# Patient Record
Sex: Male | Born: 1955 | Race: White | Hispanic: No | Marital: Married | Smoking: Never smoker
Health system: Southern US, Community
[De-identification: ages and names within clinical notes are randomized; demographics above are authoritative.]

## PROBLEM LIST (undated history)

## (undated) DIAGNOSIS — F32A Depression, unspecified: Secondary | ICD-10-CM

## (undated) DIAGNOSIS — Z862 Personal history of diseases of the blood and blood-forming organs and certain disorders involving the immune mechanism: Secondary | ICD-10-CM

## (undated) DIAGNOSIS — R399 Unspecified symptoms and signs involving the genitourinary system: Secondary | ICD-10-CM

## (undated) DIAGNOSIS — F419 Anxiety disorder, unspecified: Secondary | ICD-10-CM

## (undated) DIAGNOSIS — E785 Hyperlipidemia, unspecified: Secondary | ICD-10-CM

## (undated) DIAGNOSIS — E039 Hypothyroidism, unspecified: Secondary | ICD-10-CM

## (undated) DIAGNOSIS — I10 Essential (primary) hypertension: Secondary | ICD-10-CM

## (undated) DIAGNOSIS — J449 Chronic obstructive pulmonary disease, unspecified: Secondary | ICD-10-CM

## (undated) DIAGNOSIS — N138 Other obstructive and reflux uropathy: Secondary | ICD-10-CM

## (undated) DIAGNOSIS — Z9889 Other specified postprocedural states: Secondary | ICD-10-CM

## (undated) DIAGNOSIS — N401 Enlarged prostate with lower urinary tract symptoms: Secondary | ICD-10-CM

## (undated) DIAGNOSIS — F329 Major depressive disorder, single episode, unspecified: Secondary | ICD-10-CM

## (undated) DIAGNOSIS — R339 Retention of urine, unspecified: Secondary | ICD-10-CM

## (undated) DIAGNOSIS — H209 Unspecified iridocyclitis: Secondary | ICD-10-CM

## (undated) DIAGNOSIS — Z973 Presence of spectacles and contact lenses: Secondary | ICD-10-CM

## (undated) DIAGNOSIS — H538 Other visual disturbances: Secondary | ICD-10-CM

## (undated) DIAGNOSIS — T7840XA Allergy, unspecified, initial encounter: Secondary | ICD-10-CM

## (undated) DIAGNOSIS — J4540 Moderate persistent asthma, uncomplicated: Secondary | ICD-10-CM

## (undated) DIAGNOSIS — K219 Gastro-esophageal reflux disease without esophagitis: Secondary | ICD-10-CM

## (undated) DIAGNOSIS — E7800 Pure hypercholesterolemia, unspecified: Secondary | ICD-10-CM

## (undated) DIAGNOSIS — H4061X0 Glaucoma secondary to drugs, right eye, stage unspecified: Secondary | ICD-10-CM

## (undated) DIAGNOSIS — H269 Unspecified cataract: Secondary | ICD-10-CM

## (undated) DIAGNOSIS — J45909 Unspecified asthma, uncomplicated: Secondary | ICD-10-CM

## (undated) HISTORY — PX: EYE SURGERY: SHX253

## (undated) HISTORY — DX: Other obstructive and reflux uropathy: N40.1

## (undated) HISTORY — PX: CATARACT EXTRACTION W/ INTRAOCULAR LENS  IMPLANT, BILATERAL: SHX1307

## (undated) HISTORY — PX: COLONOSCOPY: SHX174

## (undated) HISTORY — DX: Allergy, unspecified, initial encounter: T78.40XA

## (undated) HISTORY — DX: Chronic obstructive pulmonary disease, unspecified: J44.9

## (undated) HISTORY — DX: Gastro-esophageal reflux disease without esophagitis: K21.9

## (undated) HISTORY — DX: Unspecified asthma, uncomplicated: J45.909

## (undated) HISTORY — DX: Unspecified cataract: H26.9

---

## 1987-08-16 HISTORY — PX: INGUINAL HERNIA REPAIR: SUR1180

## 2000-01-25 ENCOUNTER — Encounter: Admission: RE | Admit: 2000-01-25 | Discharge: 2000-01-25 | Payer: Self-pay | Admitting: Urology

## 2000-01-25 ENCOUNTER — Encounter: Payer: Self-pay | Admitting: Urology

## 2000-08-15 HISTORY — PX: RETINAL DETACHMENT SURGERY: SHX105

## 2001-04-10 ENCOUNTER — Encounter: Payer: Self-pay | Admitting: Ophthalmology

## 2001-04-10 ENCOUNTER — Ambulatory Visit (HOSPITAL_COMMUNITY): Admission: RE | Admit: 2001-04-10 | Discharge: 2001-04-11 | Payer: Self-pay | Admitting: Ophthalmology

## 2001-04-10 HISTORY — PX: RETINAL DETACHMENT SURGERY: SHX105

## 2001-08-15 HISTORY — PX: RETINAL DETACHMENT SURGERY: SHX105

## 2001-10-02 ENCOUNTER — Ambulatory Visit (HOSPITAL_COMMUNITY): Admission: RE | Admit: 2001-10-02 | Discharge: 2001-10-03 | Payer: Self-pay | Admitting: Ophthalmology

## 2005-04-05 ENCOUNTER — Ambulatory Visit: Payer: Self-pay | Admitting: Family Medicine

## 2005-10-19 ENCOUNTER — Ambulatory Visit: Payer: Self-pay | Admitting: Family Medicine

## 2005-10-26 ENCOUNTER — Encounter (INDEPENDENT_AMBULATORY_CARE_PROVIDER_SITE_OTHER): Payer: Self-pay | Admitting: Family Medicine

## 2005-10-26 LAB — CONVERTED CEMR LAB: Blood Glucose, Fasting: 95 mg/dL

## 2006-01-10 ENCOUNTER — Ambulatory Visit: Payer: Self-pay | Admitting: Internal Medicine

## 2006-01-24 ENCOUNTER — Encounter (INDEPENDENT_AMBULATORY_CARE_PROVIDER_SITE_OTHER): Payer: Self-pay | Admitting: Family Medicine

## 2006-01-24 ENCOUNTER — Ambulatory Visit: Payer: Self-pay | Admitting: Internal Medicine

## 2006-01-24 ENCOUNTER — Ambulatory Visit (HOSPITAL_COMMUNITY): Admission: RE | Admit: 2006-01-24 | Discharge: 2006-01-24 | Payer: Self-pay | Admitting: Internal Medicine

## 2006-01-24 ENCOUNTER — Encounter (INDEPENDENT_AMBULATORY_CARE_PROVIDER_SITE_OTHER): Payer: Self-pay | Admitting: *Deleted

## 2006-01-30 ENCOUNTER — Ambulatory Visit: Payer: Self-pay | Admitting: Family Medicine

## 2006-04-03 ENCOUNTER — Ambulatory Visit: Payer: Self-pay | Admitting: Internal Medicine

## 2006-04-06 ENCOUNTER — Ambulatory Visit (HOSPITAL_COMMUNITY): Admission: RE | Admit: 2006-04-06 | Discharge: 2006-04-06 | Payer: Self-pay | Admitting: Family Medicine

## 2006-04-06 ENCOUNTER — Ambulatory Visit: Payer: Self-pay | Admitting: Family Medicine

## 2006-04-08 ENCOUNTER — Encounter (INDEPENDENT_AMBULATORY_CARE_PROVIDER_SITE_OTHER): Payer: Self-pay | Admitting: Family Medicine

## 2006-04-08 LAB — CONVERTED CEMR LAB
RBC count: 15.2 10*6/uL
WBC, blood: 4.97 10*3/uL

## 2006-04-21 ENCOUNTER — Ambulatory Visit: Payer: Self-pay | Admitting: Family Medicine

## 2006-05-03 ENCOUNTER — Encounter (INDEPENDENT_AMBULATORY_CARE_PROVIDER_SITE_OTHER): Payer: Self-pay | Admitting: Family Medicine

## 2006-06-02 ENCOUNTER — Ambulatory Visit: Payer: Self-pay | Admitting: Family Medicine

## 2006-07-04 ENCOUNTER — Encounter: Payer: Self-pay | Admitting: Family Medicine

## 2006-07-04 DIAGNOSIS — J309 Allergic rhinitis, unspecified: Secondary | ICD-10-CM | POA: Insufficient documentation

## 2006-07-04 DIAGNOSIS — K209 Esophagitis, unspecified without bleeding: Secondary | ICD-10-CM | POA: Insufficient documentation

## 2006-07-04 DIAGNOSIS — R1310 Dysphagia, unspecified: Secondary | ICD-10-CM | POA: Insufficient documentation

## 2006-07-04 DIAGNOSIS — N400 Enlarged prostate without lower urinary tract symptoms: Secondary | ICD-10-CM | POA: Insufficient documentation

## 2006-07-04 DIAGNOSIS — K219 Gastro-esophageal reflux disease without esophagitis: Secondary | ICD-10-CM | POA: Insufficient documentation

## 2006-07-04 DIAGNOSIS — K589 Irritable bowel syndrome without diarrhea: Secondary | ICD-10-CM | POA: Insufficient documentation

## 2006-07-04 DIAGNOSIS — E785 Hyperlipidemia, unspecified: Secondary | ICD-10-CM | POA: Insufficient documentation

## 2006-07-04 DIAGNOSIS — S62609A Fracture of unspecified phalanx of unspecified finger, initial encounter for closed fracture: Secondary | ICD-10-CM | POA: Insufficient documentation

## 2006-07-04 DIAGNOSIS — F329 Major depressive disorder, single episode, unspecified: Secondary | ICD-10-CM | POA: Insufficient documentation

## 2006-07-04 DIAGNOSIS — I10 Essential (primary) hypertension: Secondary | ICD-10-CM | POA: Insufficient documentation

## 2006-07-28 ENCOUNTER — Ambulatory Visit: Payer: Self-pay | Admitting: Family Medicine

## 2006-10-16 ENCOUNTER — Encounter (INDEPENDENT_AMBULATORY_CARE_PROVIDER_SITE_OTHER): Payer: Self-pay | Admitting: Family Medicine

## 2006-10-17 ENCOUNTER — Encounter (INDEPENDENT_AMBULATORY_CARE_PROVIDER_SITE_OTHER): Payer: Self-pay | Admitting: Family Medicine

## 2006-10-28 ENCOUNTER — Encounter (INDEPENDENT_AMBULATORY_CARE_PROVIDER_SITE_OTHER): Payer: Self-pay | Admitting: Family Medicine

## 2006-10-28 LAB — CONVERTED CEMR LAB: PSA: NORMAL ng/mL

## 2006-11-06 ENCOUNTER — Ambulatory Visit: Payer: Self-pay | Admitting: Family Medicine

## 2006-11-06 DIAGNOSIS — F528 Other sexual dysfunction not due to a substance or known physiological condition: Secondary | ICD-10-CM | POA: Insufficient documentation

## 2006-11-06 LAB — CONVERTED CEMR LAB
Cholesterol, target level: 200 mg/dL
HDL goal, serum: 40 mg/dL
LDL Goal: 130 mg/dL

## 2006-11-20 ENCOUNTER — Encounter (INDEPENDENT_AMBULATORY_CARE_PROVIDER_SITE_OTHER): Payer: Self-pay | Admitting: Family Medicine

## 2006-11-21 LAB — CONVERTED CEMR LAB
AST: 17 units/L (ref 0–37)
Albumin: 4.7 g/dL (ref 3.5–5.2)
BUN: 15 mg/dL (ref 6–23)
CO2: 25 meq/L (ref 19–32)
Calcium: 10 mg/dL (ref 8.4–10.5)
Chloride: 97 meq/L (ref 96–112)
Cholesterol: 153 mg/dL (ref 0–200)
Creatinine, Ser: 1.26 mg/dL (ref 0.40–1.50)
Glucose, Bld: 93 mg/dL (ref 70–99)
HDL: 31 mg/dL — ABNORMAL LOW (ref >39)
Hemoglobin: 15.6 g/dL (ref 13.0–17.0)
Lymphocytes Relative: 22 % (ref 12–46)
Lymphs Abs: 1.8 10*3/uL (ref 0.7–3.3)
MCHC: 32.6 g/dL (ref 30.0–36.0)
Monocytes Absolute: 0.9 10*3/uL — ABNORMAL HIGH (ref 0.2–0.7)
Monocytes Relative: 11 % (ref 3–11)
Neutro Abs: 5.1 10*3/uL (ref 1.7–7.7)
Neutrophils Relative %: 62 % (ref 43–77)
Potassium: 4.8 meq/L (ref 3.5–5.3)
RBC: 5.29 M/uL (ref 4.22–5.81)
Total CHOL/HDL Ratio: 4.9
WBC: 8.1 10*3/uL (ref 4.0–10.5)

## 2006-12-18 ENCOUNTER — Encounter (INDEPENDENT_AMBULATORY_CARE_PROVIDER_SITE_OTHER): Payer: Self-pay | Admitting: Family Medicine

## 2007-01-18 ENCOUNTER — Telehealth (INDEPENDENT_AMBULATORY_CARE_PROVIDER_SITE_OTHER): Payer: Self-pay | Admitting: Family Medicine

## 2007-02-05 ENCOUNTER — Ambulatory Visit: Payer: Self-pay | Admitting: Family Medicine

## 2007-06-04 ENCOUNTER — Ambulatory Visit: Payer: Self-pay | Admitting: Family Medicine

## 2007-06-05 ENCOUNTER — Telehealth (INDEPENDENT_AMBULATORY_CARE_PROVIDER_SITE_OTHER): Payer: Self-pay | Admitting: *Deleted

## 2007-06-05 LAB — CONVERTED CEMR LAB
Albumin: 4.7 g/dL (ref 3.5–5.2)
Alkaline Phosphatase: 29 units/L — ABNORMAL LOW (ref 39–117)
BUN: 12 mg/dL (ref 6–23)
CO2: 22 meq/L (ref 19–32)
Glucose, Bld: 70 mg/dL (ref 70–99)
Sodium: 139 meq/L (ref 135–145)
Total Bilirubin: 0.7 mg/dL (ref 0.3–1.2)
Total Protein: 7.2 g/dL (ref 6.0–8.3)

## 2007-06-14 ENCOUNTER — Ambulatory Visit: Payer: Self-pay | Admitting: Internal Medicine

## 2007-07-03 ENCOUNTER — Ambulatory Visit: Payer: Self-pay | Admitting: Internal Medicine

## 2007-07-03 ENCOUNTER — Encounter (INDEPENDENT_AMBULATORY_CARE_PROVIDER_SITE_OTHER): Payer: Self-pay | Admitting: Family Medicine

## 2007-07-03 ENCOUNTER — Encounter: Payer: Self-pay | Admitting: Internal Medicine

## 2007-09-28 ENCOUNTER — Ambulatory Visit: Payer: Self-pay | Admitting: Family Medicine

## 2007-12-03 ENCOUNTER — Encounter (INDEPENDENT_AMBULATORY_CARE_PROVIDER_SITE_OTHER): Payer: Self-pay | Admitting: Family Medicine

## 2007-12-04 ENCOUNTER — Telehealth (INDEPENDENT_AMBULATORY_CARE_PROVIDER_SITE_OTHER): Payer: Self-pay | Admitting: *Deleted

## 2007-12-04 LAB — CONVERTED CEMR LAB
Albumin: 4.2 g/dL (ref 3.5–5.2)
BUN: 11 mg/dL (ref 6–23)
Calcium: 9.4 mg/dL (ref 8.4–10.5)
Chloride: 105 meq/L (ref 96–112)
Cholesterol: 116 mg/dL (ref 0–200)
Creatinine, Ser: 1.12 mg/dL (ref 0.40–1.50)
Glucose, Bld: 84 mg/dL (ref 70–99)
HDL: 30 mg/dL — ABNORMAL LOW (ref >39)
Hemoglobin: 14.2 g/dL (ref 13.0–17.0)
Lymphs Abs: 2.1 10*3/uL (ref 0.7–4.0)
MCV: 92 fL (ref 78.0–100.0)
Monocytes Absolute: 0.7 10*3/uL (ref 0.1–1.0)
Monocytes Relative: 10 % (ref 3–12)
Neutro Abs: 3.7 10*3/uL (ref 1.7–7.7)
Neutrophils Relative %: 54 % (ref 43–77)
Potassium: 4 meq/L (ref 3.5–5.3)
RBC: 4.77 M/uL (ref 4.22–5.81)
TSH: 6.961 microintl units/mL — ABNORMAL HIGH (ref 0.350–5.50)
Total CHOL/HDL Ratio: 3.9
Triglycerides: 111 mg/dL (ref <150)
WBC: 6.9 10*3/uL (ref 4.0–10.5)

## 2007-12-07 ENCOUNTER — Ambulatory Visit: Payer: Self-pay | Admitting: Family Medicine

## 2008-01-21 ENCOUNTER — Ambulatory Visit: Payer: Self-pay | Admitting: Family Medicine

## 2008-01-21 LAB — CONVERTED CEMR LAB
Blood in Urine, dipstick: NEGATIVE
Ketones, urine, test strip: NEGATIVE
Nitrite: NEGATIVE
Protein, U semiquant: NEGATIVE
Urobilinogen, UA: 0.2

## 2008-01-23 LAB — CONVERTED CEMR LAB
Free T4: 0.88 ng/dL — ABNORMAL LOW (ref 0.89–1.80)
T3, Free: 3.1 pg/mL (ref 2.3–4.2)

## 2008-04-03 ENCOUNTER — Encounter (INDEPENDENT_AMBULATORY_CARE_PROVIDER_SITE_OTHER): Payer: Self-pay | Admitting: Family Medicine

## 2008-04-24 ENCOUNTER — Ambulatory Visit: Payer: Self-pay | Admitting: Family Medicine

## 2008-04-24 LAB — CONVERTED CEMR LAB
Bilirubin Urine: NEGATIVE
Ketones, urine, test strip: NEGATIVE
Specific Gravity, Urine: 1.01
WBC Urine, dipstick: NEGATIVE

## 2008-04-25 LAB — CONVERTED CEMR LAB
Alkaline Phosphatase: 30 units/L — ABNORMAL LOW (ref 39–117)
BUN: 10 mg/dL (ref 6–23)
Glucose, Bld: 97 mg/dL (ref 70–99)
Sodium: 140 meq/L (ref 135–145)
Total Bilirubin: 0.7 mg/dL (ref 0.3–1.2)
Total Protein: 7 g/dL (ref 6.0–8.3)

## 2008-06-04 ENCOUNTER — Ambulatory Visit: Payer: Self-pay | Admitting: Family Medicine

## 2008-10-21 ENCOUNTER — Ambulatory Visit: Payer: Self-pay | Admitting: Family Medicine

## 2008-10-21 DIAGNOSIS — F524 Premature ejaculation: Secondary | ICD-10-CM | POA: Insufficient documentation

## 2008-11-14 ENCOUNTER — Encounter (INDEPENDENT_AMBULATORY_CARE_PROVIDER_SITE_OTHER): Payer: Self-pay | Admitting: Family Medicine

## 2008-11-17 ENCOUNTER — Encounter (INDEPENDENT_AMBULATORY_CARE_PROVIDER_SITE_OTHER): Payer: Self-pay | Admitting: Family Medicine

## 2008-11-17 LAB — CONVERTED CEMR LAB
Basophils Relative: 1 % (ref 0–1)
CO2: 27 meq/L (ref 19–32)
Cholesterol: 123 mg/dL (ref 0–200)
Creatinine, Ser: 1.16 mg/dL (ref 0.40–1.50)
Eosinophils Absolute: 0.3 10*3/uL (ref 0.0–0.7)
Glucose, Bld: 86 mg/dL (ref 70–99)
Hemoglobin: 15.1 g/dL (ref 13.0–17.0)
MCHC: 31.8 g/dL (ref 30.0–36.0)
MCV: 93.9 fL (ref 78.0–100.0)
Monocytes Absolute: 0.6 10*3/uL (ref 0.1–1.0)
Monocytes Relative: 9 % (ref 3–12)
RBC: 5.06 M/uL (ref 4.22–5.81)
Total Bilirubin: 0.6 mg/dL (ref 0.3–1.2)
Total CHOL/HDL Ratio: 4.4
Total Protein: 7.2 g/dL (ref 6.0–8.3)
Triglycerides: 191 mg/dL — ABNORMAL HIGH (ref <150)
VLDL: 38 mg/dL (ref 0–40)

## 2008-11-20 LAB — CONVERTED CEMR LAB: T3, Free: 2.7 pg/mL (ref 2.3–4.2)

## 2009-02-05 ENCOUNTER — Ambulatory Visit: Payer: Self-pay | Admitting: Family Medicine

## 2009-02-05 DIAGNOSIS — H3320 Serous retinal detachment, unspecified eye: Secondary | ICD-10-CM | POA: Insufficient documentation

## 2009-02-06 ENCOUNTER — Encounter (INDEPENDENT_AMBULATORY_CARE_PROVIDER_SITE_OTHER): Payer: Self-pay | Admitting: Family Medicine

## 2009-05-04 ENCOUNTER — Ambulatory Visit: Payer: Self-pay | Admitting: Family Medicine

## 2009-05-14 ENCOUNTER — Encounter (INDEPENDENT_AMBULATORY_CARE_PROVIDER_SITE_OTHER): Payer: Self-pay | Admitting: Family Medicine

## 2010-12-31 NOTE — Op Note (Signed)
. [facility]  Patient:    Jeffrey, Gordon Visit Number: [id number] MRN: [id number]          Service Type: DSU Location: RCRM 2550 [id number] Attending Physician:  Bertrum Sol Dictated by:   Beulah Gandy. Ashley Royalty, M.D. Proc. Date: 10/02/01 Admit Date:  10/02/2001                             Operative Report  DATE OF BIRTH:  02-20-1956.  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  OPERATION PERFORMED:  Scleral buckle #2 left eye with indirect ophthalmoscope laser left eye.  SURGEON:  Beulah Gandy. Ashley Royalty, M.D.  ASSISTANT:  Lu Duffel, COA, SA  ANESTHESIA:  General.  DESCRIPTION OF PROCEDURE:  Usual prep and drape.  Limbal peritomy 360 degrees. Opening the previous buckle from 3 oclock to 9 oclock.  Additional posterior dissection carried out from 4 oclock to 8 oclock.  Perforation site chosen in the posterior aspect of the bed at 6 oclock.  A moderate amount of clear, colorless subretinal fluid came forth.  The previous buckle was allowed to remain in place.  Once all the fluid had egressed, a 508G radial segment was placed against the globe at 6 oclock and snugged up tightly against the previous 508 segment at 4 oclock.  The buckle was closed with 4-0 Mersilene sutures.  Indirect ophthalmoscopy showed no subretinal fluid remaining.  The indirect ophthalmoscope laser was moved into place and 503 burns were placed on the scleral buckle.  The power was between 400 and 500 mw, diameter 1000 microns each and 0.1 seconds each.  The sterile air was injected through the 2 oclock pars plana to reinflate the globe.  The suture ends were knotted and the free ends removed.  The conjunctiva was reposited with 7-0 chromic suture. Polymyxin and gentamicin were irrigated into Tenons space.  Atropine solution was applied.  Decadron 10 mg was injected into the  lower subconjunctival space.  Polysporin, a patch and shield were placed.  The patient was awakened and taken to recovery in satisfactory condition.  The closing tension was 10 with a Barraquer tonometer.  COMPLICATIONS:  None.  DURATION:  1-1/2 hours.  The patient was awakened and taken to recovery in satisfactory condition. Dictated by:   Beulah Gandy. Ashley Royalty, M.D. Attending Physician:  Bertrum Sol DD:  10/02/01 TD:  10/02/01 Job: [id number]

## 2010-12-31 NOTE — Consult Note (Signed)
NAMECLEAVE, TERNES              ACCOUNT NO.:  [id number]   MEDICAL RECORD NO.:  [id number]           PATIENT TYPE:   LOCATION:                                 FACILITY:   PHYSICIAN:  Lionel December, M.D.         DATE OF BIRTH:   DATE OF CONSULTATION:  01/12/2006  DATE OF DISCHARGE:                                   CONSULTATION   PRIMARY CARE PHYSICIAN:  Dr. Erby Pian   REASON FOR CONSULTATION:  Chronic GERD, long-standing, intermittent  dysphagia.   HISTORY OF PRESENT ILLNESS:  Mr. Sollenberger is a 55 year old Caucasian male  who presents with a 15-year history of chronic GERD.  He states his symptoms  are currently well-developed on omeprazole and has previous history of  heartburn and indigestion.  He complains over the last 15 years  intermittently he has had solid food dysphagia.  More recently his symptoms  have become more frequent.  He describes a pressure in his mid esophagus and  feels at times his food is stuck.  If he continues to swallow eventually the  food does pass.  He denies any regurgitation.  He denies any odynophagia.  He denies any nausea, vomiting.  Denies any anorexia or early satiety.  Denies any significant abdominal pain.  Does have fleeting left upper  quadrant pain generally worse with stress.  It is self-limited and generally  lasts less than an hour.  His bowel movements are once a day or every other  day.  Denies rectal bleeding, melena, diarrhea, or constipation.   PAST MEDICAL HISTORY:  1.  He had a colonoscopy in 2004 by Dr. Marina Goodell which was negative.  2.  He has history of hypertension.  3.  Hypercholesterolemia.  4.  Chronic GERD.  5.  BPH.  6.  He has had surgery on his left retina and cataract.  7.  Left inguinal hernia repair.   CURRENT MEDICATIONS:  1.  Lipitor 20 mg daily.  2.  Flomax 0.4 mg daily.  3.  Diovan 80 mg daily.  4.  Omeprazole 20 mg daily.  5.  Viagra 100 mg p.r.n.   ALLERGIES:  No known drug allergies.   FAMILY  HISTORY:  Positive for a father with recurrent colon carcinoma  diagnosed initially at age 44.  He is currently 17 and doing fair.  Mother  has a history of rheumatoid arthritis.  He has one brother with hypertension  and hypercholesterolemia.   SOCIAL HISTORY:  Mr. Schetter has been married for 21 years.  He has two  grown healthy children.  He is a [employer].  He denies any  tobacco, alcohol, or drug use.   REVIEW OF SYSTEMS:  CONSTITUTIONAL:  His weight is steadily increasing over  the last few years.  Denies any fatigue.  CARDIOVASCULAR:  Denies any chest  pain or palpitations.  PULMONARY:  Denies any shortness of breath or  dyspnea.  He does have occasional nonproductive cough.  Denies any  hemoptysis.  GI:  See HPI.   PHYSICAL EXAMINATION:  VITAL SIGNS:  Weight 212 pounds, height  76 inches,  temperature 98.4, blood pressure 126/80, pulse 80.  GENERAL:  Mr. Hellums is a 55 year old overweight Caucasian male who is  alert, oriented, pleasant, cooperative, in no acute distress.  HEENT:  Sclerae clear.  Non-icteric.  Conjunctivae pink.  Oropharynx pink  and moist without any lesions.  NECK:  Supple without any mass or thyromegaly.  CHEST:  Heart regular rate and rhythm with normal S1, S2 without clicks,  rubs, or gallops.  LUNGS:  Clear to auscultation bilaterally.  ABDOMEN:  Protuberant with positive bowel sounds x4.  No bruits auscultated.  Soft, nontender, nondistended without palpable mass, hepatosplenomegaly.  No  rebound tenderness or guarding.  EXTREMITIES:  Without clubbing or edema bilaterally.   ASSESSMENT:  Mr. Maland is a 55 year old Caucasian male with a 15-year  history of chronic gastroesophageal reflux disease well controlled on  generic omeprazole.  He has had a 15-year history of intermittent solid food  dysphagia.  Over the last several months this has become increasingly worse.  He is having more frequent episodes where he feels his food gets  stuck in  his mid esophagus.  He is going to need EGD for further evaluation to rule  out complicated gastroesophageal reflux disease as he may have developed  esophageal ring, web, or stricture.  He does have a family history of colon  cancer.  Will need follow-up colonoscopy in five years or sooner if he has  any problems.   PLAN:  1.  Continue omeprazole 20 mg daily for now #30 with 11 refills.  2.  GERD literature is given for his review.  3.  EGD with Dr. Karilyn Cota in the near future.  I have discussed this procedure      and the risks and benefits which include, but not limited to, bleeding,      infection, perforation, drug reaction.  She agrees with the plan and      consent will be obtained.  4.  November 2009 colonoscopy either here or with Dr. Marina Goodell.      Nicholas Lose, N.P.      Lionel December, M.D.  Electronically Signed    KC/MEDQ  D:  01/12/2006  T:  01/12/2006  Job:  [id number]

## 2010-12-31 NOTE — Op Note (Signed)
[place]  Patient:    JARAN, SAINZ Visit Number: [id number] MRN: [id number]          Service Type: DSU Location: 5700 [id number] Attending Physician:  Bertrum Sol Proc. Date: 04/10/01 Adm. Date:  04/10/2001 Disc. 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  OPERATION PERFORMED:  Scleral buckle, retinal photocoagulation, and gas-fluid exchange of the left eye.  SURGEON:  Beulah Gandy. Ashley Royalty, M.D.  ASSISTANT:  Lu Duffel, COA, SA  ANESTHESIA:  General.  DESCRIPTION OF PROCEDURE:  Usual prep and drape.  360 degree limbal peritomy, isolation of four rectus muscles on 2-0 silk.  Localization of break in the lower temporal quadrant.  Cryopexy was placed around the break.  Scleral dissection was carried out from 3 oclock to 10 oclock to admit a #279 intrascleral implant.  Diathermy was placed in the bed.  Perforation site was chosen at [employer].  A large amount of clear, colorless subretinal fluid came forth.  508G radial segment was placed beneath the 279 band and placed in the bed.  4-0 Mersilene sutures were used to close the flaps over the buckle elements.  A 240 band was placed around the eye with the belt loop at 11 oclock and 2 oclock and 270 sleeve at 10 oclock.  An additional perforation site was chosen at [employer] and at 8 oclock.  Moderate amount of clear subretinal fluid came forth.  Scleral flaps were closed.  Indirect ophthalmoscopy showed some folding on the scleral buckle.  10% perfluoropropane 0.8 cc was then injected at 12 oclock into the vitreous cavity.  The retina then was found to be lying nicely on the scleral buckle. The indirect ophthalmoscope laser was moved into place and 330 burns were placed on the scleral buckle with a power of 400  milliwatts, 1000 microns each and 0.1 seconds each.  The buckle elements were trimmed, the band was adjusted to a proper indentation of the globe and trimmed.  The conjunctiva was reposited with 7-0 chromic suture.  Polymyxin and gentamicin were irrigated into Tenons space.  Atropine solution was applied.  Decadron 10 mg was injected into the lower conjunctival space.  Marcaine was injected around the globe for postoperative pain.  The closing tension was 10 with a Barraquer tonometer.  Polysporin, a patch and shield were placed.  The patient was awakened and taken to recovery in satisfactory condition.  COMPLICATIONS:  None.  DURATION:  Two hours. Attending Physician:  Bertrum Sol DD:  04/10/01 TD:  04/11/01 Job: [id number]

## 2010-12-31 NOTE — Op Note (Signed)
Jeffrey Gordon, Jeffrey Gordon              ACCOUNT NO.:  [id number]   MEDICAL RECORD NO.:  [id number]          PATIENT TYPE:  AMB   LOCATION:  DAY                           FACILITY:  [facility]   PHYSICIAN:  Lionel December, M.D.    DATE OF BIRTH:  10-22-1955   DATE OF PROCEDURE:  01/24/2006  DATE OF DISCHARGE:                                 OPERATIVE REPORT   PROCEDURE:  Esophagogastroduodenoscopy with esophageal dilation.   INDICATION:  Lavell is 55 year old Caucasian male with symptoms of chronic  GERD and his heartburn is well-controlled with antireflux measures and  omeprazole.  He is however also experiencing intermittent solid food  dysphagia which he has had for at least 10 years.  He states he has gotten  better but not gone away completely since he has been on omeprazole.  He is  undergoing diagnostic/therapeutic procedure.  Procedure risks were reviewed  the patient and informed consent was obtained.   MEDS FOR CONSCIOUS SEDATION:  Benzocaine spray for pharyngeal topical  anesthesia, Demerol 50 mg IV, Versed 6 mg IV.   FINDINGS:  Procedure performed in endoscopy suite.  The patient's vital  signs and O2 sat were monitored during the procedure and remained stable.  The patient was placed left lateral position and Olympus videoscope was  passed via oropharynx without any difficulty into esophagus.   Esophagus.  Mucosa of the esophagus was normal except distal 2-3 cm where  there was mucosal pallor and wrinkling or multiple rings.  GE junction was  at 44 cm from the incisors.  No hernia was noted.   Stomach.  It was empty and distended very well with insufflation.  Folds  proximal stomach were normal.  Examination mucosa at body, antrum, pyloric  channel as well as angularis, fundus and cardia was normal.   Duodenum.  Bulbar mucosa was normal.  Scope was passed second part of  duodenum where mucosa and folds were normal.  Endoscope was withdrawn.   Esophagus was dilated by passing  56-French Maloney dilator to full  insertion.  As the dilator was withdrawn, endoscope was passed again and  there was a small linear tear of cervical esophagus possibly indicative of  web as well as __________  tear at distal esophagus site where he had  mucosal wrinkling.  Biopsy was taken from the mucosa away from the tear for  through routine histology looking for eosinophilic esophagitis.  Endoscope  was withdrawn.  The patient tolerated the procedure well.   FINAL DIAGNOSIS:  Distal esophageal narrowing with circumferential  rings/wrinkles.  The esophagus dilated by passing 56-French [facility] dilator.   A biopsy was taken from this segment of mucosa looking for eosinophilic  esophagitis.   Normal exam of stomach, first and second part of duodenum.   RECOMMENDATIONS:  Continue antireflux measures and omeprazole as before.  I  will be contacting the patient with results of biopsy and further  recommendations if any.      Lionel December, M.D.  Electronically Signed     NR/MEDQ  D:  01/24/2006  T:  01/24/2006  Job:  [id number]  cc:   Dr. Erby Pian

## 2011-09-29 ENCOUNTER — Encounter (INDEPENDENT_AMBULATORY_CARE_PROVIDER_SITE_OTHER): Payer: 59 | Admitting: Ophthalmology

## 2011-09-29 DIAGNOSIS — H35039 Hypertensive retinopathy, unspecified eye: Secondary | ICD-10-CM

## 2011-09-29 DIAGNOSIS — H43819 Vitreous degeneration, unspecified eye: Secondary | ICD-10-CM

## 2011-09-29 DIAGNOSIS — H27129 Anterior dislocation of lens, unspecified eye: Secondary | ICD-10-CM

## 2011-09-29 DIAGNOSIS — I10 Essential (primary) hypertension: Secondary | ICD-10-CM

## 2011-09-29 DIAGNOSIS — H33009 Unspecified retinal detachment with retinal break, unspecified eye: Secondary | ICD-10-CM

## 2011-09-30 ENCOUNTER — Other Ambulatory Visit (INDEPENDENT_AMBULATORY_CARE_PROVIDER_SITE_OTHER): Payer: Self-pay | Admitting: Ophthalmology

## 2011-09-30 MED ORDER — DEXTROSE 5 % IV SOLN: 1.0000 g | INTRAVENOUS | Status: DC | PRN

## 2011-09-30 NOTE — H&P (Signed)
Jeffrey Gordon is an 56 y.o. male.   Chief Complaint:  Dislocated Intraocular lens left eye  HPI: dislocated intraocular lens left eye  No past medical history on file.  Had cataract surgery with [facility] Richard Epes on 04-10-01  No family history on file. Social History:  does not have a smoking history on file. He does not have any smokeless tobacco history on file. His alcohol and drug histories not on file.  Allergies: Allergies not on file  No current facility-administered medications on file as of .   No current outpatient prescriptions on file as of .    Review of systems otherwise negative  There were no vitals taken for this visit.  Physical exam: Mental status: oriented x3. Eyes: See eye exam associated with this surgery on this date scanned in by scanning center.  See media tab Ears, Nose, Throat: within normal limits Neck: Within Normal limits General: within normal limits Chest: Within normal limits Breast: deferred Heart: Within normal limits Abdomen: Within normal limits GU: deferred Extremities: within normal limits Skin: within normal limits  Assessment/Plan Dislocated Intraocular lens left eye Plan: To [facility] for pars plana vitrectomy with removal of intraocular lens and placement of secondary intraocular lens with suture  Sherrie George 09/30/2011, 3:58 PM

## 2011-10-05 ENCOUNTER — Encounter (HOSPITAL_COMMUNITY): Payer: Self-pay | Admitting: Pharmacy Technician

## 2011-10-18 ENCOUNTER — Encounter (HOSPITAL_COMMUNITY): Payer: Self-pay | Admitting: *Deleted

## 2011-10-19 MED ORDER — PHENYLEPHRINE HCL 2.5 % OP SOLN
1.0000 [drp] | OPHTHALMIC | Status: AC | PRN
  Administered 2011-10-20 (×3): 1 [drp] via OPHTHALMIC
  Filled 2011-10-19: qty 3

## 2011-10-19 MED ORDER — CYCLOPENTOLATE HCL 1 % OP SOLN
1.0000 [drp] | OPHTHALMIC | Status: AC | PRN
  Administered 2011-10-20 (×3): 1 [drp] via OPHTHALMIC
  Filled 2011-10-19: qty 2

## 2011-10-19 MED ORDER — TROPICAMIDE 1 % OP SOLN
1.0000 [drp] | OPHTHALMIC | Status: AC | PRN
  Administered 2011-10-20 (×3): 1 [drp] via OPHTHALMIC
  Filled 2011-10-19: qty 3

## 2011-10-19 MED ORDER — GATIFLOXACIN 0.5 % OP SOLN
1.0000 [drp] | OPHTHALMIC | Status: AC | PRN
  Administered 2011-10-20 (×3): 1 [drp] via OPHTHALMIC
  Filled 2011-10-19: qty 2.5

## 2011-10-20 ENCOUNTER — Other Ambulatory Visit: Payer: Self-pay

## 2011-10-20 ENCOUNTER — Encounter (HOSPITAL_COMMUNITY): Payer: Self-pay | Admitting: Surgery

## 2011-10-20 ENCOUNTER — Other Ambulatory Visit (HOSPITAL_COMMUNITY): Payer: Self-pay | Admitting: Ophthalmology

## 2011-10-20 ENCOUNTER — Encounter (HOSPITAL_COMMUNITY): Payer: Self-pay | Admitting: General Practice

## 2011-10-20 ENCOUNTER — Ambulatory Visit (HOSPITAL_COMMUNITY): Payer: 59

## 2011-10-20 ENCOUNTER — Encounter (HOSPITAL_COMMUNITY)
Admission: RE | Disposition: A | Discharge status: Home / Self Care | Payer: Self-pay | Source: Ambulatory Visit | Attending: Ophthalmology

## 2011-10-20 ENCOUNTER — Encounter (HOSPITAL_COMMUNITY): Payer: Self-pay | Admitting: Anesthesiology

## 2011-10-20 ENCOUNTER — Ambulatory Visit (HOSPITAL_COMMUNITY)
Admission: RE | Admit: 2011-10-20 | Discharge: 2011-10-21 | Disposition: A | Discharge status: Home / Self Care | Payer: 59 | Source: Ambulatory Visit | Attending: Ophthalmology | Admitting: Ophthalmology

## 2011-10-20 ENCOUNTER — Ambulatory Visit (HOSPITAL_COMMUNITY): Payer: 59 | Admitting: Anesthesiology

## 2011-10-20 DIAGNOSIS — H3320 Serous retinal detachment, unspecified eye: Secondary | ICD-10-CM

## 2011-10-20 DIAGNOSIS — Y849 Medical procedure, unspecified as the cause of abnormal reaction of the patient, or of later complication, without mention of misadventure at the time of the procedure: Secondary | ICD-10-CM

## 2011-10-20 DIAGNOSIS — T8529XA Other mechanical complication of intraocular lens, initial encounter: Secondary | ICD-10-CM | POA: Insufficient documentation

## 2011-10-20 HISTORY — DX: Anxiety disorder, unspecified: F41.9

## 2011-10-20 HISTORY — DX: Essential (primary) hypertension: I10

## 2011-10-20 HISTORY — DX: Gastro-esophageal reflux disease without esophagitis: K21.9

## 2011-10-20 HISTORY — DX: Hypothyroidism, unspecified: E03.9

## 2011-10-20 HISTORY — DX: Other visual disturbances: H53.8

## 2011-10-20 HISTORY — DX: Depression, unspecified: F32.A

## 2011-10-20 HISTORY — DX: Pure hypercholesterolemia, unspecified: E78.00

## 2011-10-20 HISTORY — PX: PARS PLANA VITRECTOMY: SHX2166

## 2011-10-20 HISTORY — DX: Major depressive disorder, single episode, unspecified: F32.9

## 2011-10-20 LAB — BASIC METABOLIC PANEL
BUN: 10 mg/dL (ref 6–23)
Calcium: 9.8 mg/dL (ref 8.4–10.5)
GFR calc Af Amer: 89 mL/min — ABNORMAL LOW (ref >90)
GFR calc non Af Amer: 77 mL/min — ABNORMAL LOW (ref >90)
Potassium: 3.9 mEq/L (ref 3.5–5.1)
Sodium: 135 mEq/L (ref 135–145)

## 2011-10-20 LAB — CBC
MCH: 30.5 pg (ref 26.0–34.0)
MCHC: 34.9 g/dL (ref 30.0–36.0)
RDW: 13.2 % (ref 11.5–15.5)

## 2011-10-20 LAB — SURGICAL PCR SCREEN: Staphylococcus aureus: NEGATIVE

## 2011-10-20 SURGERY — PARS PLANA VITRECTOMY WITH 25G REMOVAL/SUTURE INTRAOCULAR LENS
Anesthesia: General | Site: Eye | Laterality: Left | Wound class: Clean

## 2011-10-20 MED ORDER — ROCURONIUM BROMIDE 100 MG/10ML IV SOLN
INTRAVENOUS | Status: DC | PRN
  Administered 2011-10-20: 40 mg via INTRAVENOUS
  Administered 2011-10-20: 10 mg via INTRAVENOUS

## 2011-10-20 MED ORDER — PROPOFOL 10 MG/ML IV EMUL
INTRAVENOUS | Status: DC | PRN
  Administered 2011-10-20: 50 mg via INTRAVENOUS
  Administered 2011-10-20: 200 mg via INTRAVENOUS

## 2011-10-20 MED ORDER — BACITRACIN-POLYMYXIN B 500-10000 UNIT/GM OP OINT: TOPICAL_OINTMENT | Freq: Three times a day (TID) | OPHTHALMIC | Status: DC

## 2011-10-20 MED ORDER — SODIUM CHLORIDE 0.9 % IJ SOLN
INTRAMUSCULAR | Status: DC | PRN
  Administered 2011-10-20: 10 mL

## 2011-10-20 MED ORDER — LATANOPROST 0.005 % OP SOLN
1.0000 [drp] | Freq: Every day | OPHTHALMIC | Status: DC
  Filled 2011-10-20: qty 2.5

## 2011-10-20 MED ORDER — MAGNESIUM HYDROXIDE 400 MG/5ML PO SUSP: 15.0000 mL | Freq: Four times a day (QID) | ORAL | Status: DC | PRN

## 2011-10-20 MED ORDER — ACETAMINOPHEN 325 MG PO TABS
325.0000 mg | ORAL_TABLET | ORAL | Status: DC | PRN
  Administered 2011-10-20 – 2011-10-21 (×3): 650 mg via ORAL
  Filled 2011-10-20 (×2): qty 2

## 2011-10-20 MED ORDER — ACETAZOLAMIDE SODIUM 500 MG IJ SOLR
500.0000 mg | Freq: Once | INTRAMUSCULAR | Status: AC
  Administered 2011-10-21: 500 mg via INTRAVENOUS
  Filled 2011-10-20: qty 500

## 2011-10-20 MED ORDER — OXYCODONE-ACETAMINOPHEN 5-325 MG PO TABS: 1.0000 | ORAL_TABLET | ORAL | Status: DC | PRN

## 2011-10-20 MED ORDER — GLYCOPYRROLATE 0.2 MG/ML IJ SOLN
INTRAMUSCULAR | Status: DC | PRN
  Administered 2011-10-20: .6 mg via INTRAVENOUS

## 2011-10-20 MED ORDER — TEMAZEPAM 15 MG PO CAPS
15.0000 mg | ORAL_CAPSULE | Freq: Every evening | ORAL | Status: DC | PRN
  Administered 2011-10-20: 15 mg via ORAL
  Filled 2011-10-20: qty 1

## 2011-10-20 MED ORDER — EPINEPHRINE HCL 1 MG/ML IJ SOLN
INTRAMUSCULAR | Status: DC | PRN
  Administered 2011-10-20: .3 mL

## 2011-10-20 MED ORDER — SODIUM CHLORIDE 0.9 % IV SOLN
INTRAVENOUS | Status: DC | PRN
  Administered 2011-10-20 (×2): via INTRAVENOUS

## 2011-10-20 MED ORDER — PHENYLEPHRINE HCL 10 MG/ML IJ SOLN
INTRAMUSCULAR | Status: DC | PRN
  Administered 2011-10-20 (×2): 40 ug via INTRAVENOUS

## 2011-10-20 MED ORDER — NEOSTIGMINE METHYLSULFATE 1 MG/ML IJ SOLN
INTRAMUSCULAR | Status: DC | PRN
  Administered 2011-10-20: 4 mg via INTRAVENOUS

## 2011-10-20 MED ORDER — PREDNISOLONE ACETATE 1 % OP SUSP
1.0000 [drp] | Freq: Four times a day (QID) | OPHTHALMIC | Status: DC
  Filled 2011-10-20: qty 1

## 2011-10-20 MED ORDER — GATIFLOXACIN 0.5 % OP SOLN
1.0000 [drp] | Freq: Four times a day (QID) | OPHTHALMIC | Status: DC
  Filled 2011-10-20: qty 2.5

## 2011-10-20 MED ORDER — HEMOSTATIC AGENTS (NO CHARGE) OPTIME
TOPICAL | Status: DC | PRN
  Administered 2011-10-20: 1 via TOPICAL

## 2011-10-20 MED ORDER — MUPIROCIN 2 % EX OINT
TOPICAL_OINTMENT | Freq: Two times a day (BID) | CUTANEOUS | Status: DC
  Administered 2011-10-20: 1 via NASAL
  Filled 2011-10-20: qty 22

## 2011-10-20 MED ORDER — POLYMYXIN B SULFATE 500000 UNITS IJ SOLR
INTRAMUSCULAR | Status: DC | PRN
  Administered 2011-10-20: 500000 [IU]

## 2011-10-20 MED ORDER — BACITRACIN-POLYMYXIN B 500-10000 UNIT/GM OP OINT
TOPICAL_OINTMENT | OPHTHALMIC | Status: DC | PRN
  Administered 2011-10-20: 1 via OPHTHALMIC

## 2011-10-20 MED ORDER — CEFAZOLIN SODIUM 1-5 GM-% IV SOLN
INTRAVENOUS | Status: AC
  Filled 2011-10-20: qty 100

## 2011-10-20 MED ORDER — MIDAZOLAM HCL 5 MG/5ML IJ SOLN
INTRAMUSCULAR | Status: DC | PRN
  Administered 2011-10-20: 2 mg via INTRAVENOUS

## 2011-10-20 MED ORDER — LIDOCAINE HCL 4 % MT SOLN
OROMUCOSAL | Status: DC | PRN
  Administered 2011-10-20: 4 mL via TOPICAL

## 2011-10-20 MED ORDER — LACTATED RINGERS IV SOLN: INTRAVENOUS | Status: DC | PRN

## 2011-10-20 MED ORDER — SODIUM HYALURONATE 10 MG/ML IO SOLN
INTRAOCULAR | Status: DC | PRN
  Administered 2011-10-20: 0.85 mL via INTRAOCULAR

## 2011-10-20 MED ORDER — BSS IO SOLN
INTRAOCULAR | Status: DC | PRN
  Administered 2011-10-20: 15 mL via INTRAOCULAR

## 2011-10-20 MED ORDER — BRIMONIDINE TARTRATE 0.2 % OP SOLN
1.0000 [drp] | Freq: Two times a day (BID) | OPHTHALMIC | Status: DC
  Filled 2011-10-20: qty 5

## 2011-10-20 MED ORDER — DOXAZOSIN MESYLATE 8 MG PO TABS
8.0000 mg | ORAL_TABLET | Freq: Every day | ORAL | Status: DC
  Administered 2011-10-20: 8 mg via ORAL
  Filled 2011-10-20 (×2): qty 1

## 2011-10-20 MED ORDER — BSS PLUS IO SOLN
INTRAOCULAR | Status: DC | PRN
  Administered 2011-10-20: 1 via OPHTHALMIC

## 2011-10-20 MED ORDER — DEXAMETHASONE SODIUM PHOSPHATE 10 MG/ML IJ SOLN
INTRAMUSCULAR | Status: DC | PRN
  Administered 2011-10-20: 10 mg

## 2011-10-20 MED ORDER — CEFAZOLIN SODIUM 1-5 GM-% IV SOLN
INTRAVENOUS | Status: DC | PRN
  Administered 2011-10-20: 2 g via INTRAVENOUS

## 2011-10-20 MED ORDER — ONDANSETRON HCL 4 MG/2ML IJ SOLN
INTRAMUSCULAR | Status: DC | PRN
  Administered 2011-10-20: 4 mg via INTRAVENOUS

## 2011-10-20 MED ORDER — LEVOTHYROXINE SODIUM 88 MCG PO TABS
88.0000 ug | ORAL_TABLET | Freq: Every day | ORAL | Status: DC
  Administered 2011-10-21: 88 ug via ORAL
  Filled 2011-10-20 (×2): qty 1

## 2011-10-20 MED ORDER — ATROPINE SULFATE 1 % OP SOLN
OPHTHALMIC | Status: DC | PRN
  Administered 2011-10-20: 2 [drp] via OPHTHALMIC

## 2011-10-20 MED ORDER — TETRACAINE HCL 0.5 % OP SOLN
2.0000 [drp] | Freq: Once | OPHTHALMIC | Status: DC
  Filled 2011-10-20: qty 2

## 2011-10-20 MED ORDER — FENTANYL CITRATE 0.05 MG/ML IJ SOLN
25.0000 ug | INTRAMUSCULAR | Status: DC | PRN
  Administered 2011-10-20: 25 ug via INTRAVENOUS

## 2011-10-20 MED ORDER — BUPIVACAINE HCL 0.75 % IJ SOLN
INTRAMUSCULAR | Status: DC | PRN
  Administered 2011-10-20: 10 mL

## 2011-10-20 MED ORDER — GENTAMICIN SULFATE 40 MG/ML IJ SOLN
INTRAMUSCULAR | Status: DC | PRN
  Administered 2011-10-20: 80 mg

## 2011-10-20 MED ORDER — SODIUM CHLORIDE 0.45 % IV SOLN
INTRAVENOUS | Status: DC
  Administered 2011-10-20: 14:00:00 via INTRAVENOUS

## 2011-10-20 MED ORDER — ONDANSETRON HCL 4 MG/2ML IJ SOLN: 4.0000 mg | Freq: Four times a day (QID) | INTRAMUSCULAR | Status: DC | PRN

## 2011-10-20 MED ORDER — EPHEDRINE SULFATE 50 MG/ML IJ SOLN
INTRAMUSCULAR | Status: DC | PRN
  Administered 2011-10-20: 5 mg via INTRAVENOUS
  Administered 2011-10-20: 10 mg via INTRAVENOUS
  Administered 2011-10-20 (×4): 5 mg via INTRAVENOUS
  Administered 2011-10-20: 10 mg via INTRAVENOUS

## 2011-10-20 MED ORDER — FENTANYL CITRATE 0.05 MG/ML IJ SOLN
INTRAMUSCULAR | Status: DC | PRN
  Administered 2011-10-20: 50 ug via INTRAVENOUS

## 2011-10-20 MED ORDER — MORPHINE SULFATE 2 MG/ML IJ SOLN: 1.0000 mg | INTRAMUSCULAR | Status: DC | PRN

## 2011-10-20 MED ORDER — MUPIROCIN 2 % EX OINT
TOPICAL_OINTMENT | CUTANEOUS | Status: AC
  Filled 2011-10-20: qty 22

## 2011-10-20 SURGICAL SUPPLY — 72 items
ACCESSORY FRAGMATOME (MISCELLANEOUS) IMPLANT
APL SRG 3 HI ABS STRL LF PLS (MISCELLANEOUS)
APPLICATOR DR MATTHEWS STRL (MISCELLANEOUS) IMPLANT
BALL CTTN LRG ABS STRL LF (GAUZE/BANDAGES/DRESSINGS) ×3
BLADE EYE CATARACT 19 1.4 BEAV (BLADE) IMPLANT
BLADE KERATOME 2.75 (BLADE) ×2 IMPLANT
CANNULA FLEX TIP 25G (CANNULA) IMPLANT
CLOTH BEACON ORANGE TIMEOUT ST (SAFETY) ×2 IMPLANT
CORDS BIPOLAR (ELECTRODE) IMPLANT
COTTONBALL LRG STERILE PKG (GAUZE/BANDAGES/DRESSINGS) ×6 IMPLANT
COVER MAYO STAND STRL (DRAPES) IMPLANT
DRAPE OPHTHALMIC 77X100 STRL (CUSTOM PROCEDURE TRAY) ×2 IMPLANT
FILTER BLUE MILLIPORE (MISCELLANEOUS) IMPLANT
FORCEPS ECKARDT ILM 25G SERR (OPHTHALMIC RELATED) ×1 IMPLANT
FORCEPS HORIZONTAL 25G DISP (OPHTHALMIC RELATED) ×2 IMPLANT
GAS OPHTHALMIC (MISCELLANEOUS) IMPLANT
GLOVE ECLIPSE 6.5 STRL STRAW (GLOVE) ×1 IMPLANT
GLOVE SS BIOGEL STRL SZ 6.5 (GLOVE) ×1 IMPLANT
GLOVE SS BIOGEL STRL SZ 7 (GLOVE) ×1 IMPLANT
GLOVE SUPERSENSE BIOGEL SZ 6.5 (GLOVE) ×1
GLOVE SUPERSENSE BIOGEL SZ 7 (GLOVE) ×1
GLOVE SURG 8.5 LATEX PF (GLOVE) ×2 IMPLANT
GLOVE SURG SS PI 6.5 STRL IVOR (GLOVE) ×1 IMPLANT
GOWN STRL NON-REIN LRG LVL3 (GOWN DISPOSABLE) ×7 IMPLANT
ILLUMINATOR CHOW PICK 25GA (MISCELLANEOUS) ×2 IMPLANT
KIT BASIN OR (CUSTOM PROCEDURE TRAY) ×2 IMPLANT
KIT ROOM TURNOVER OR (KITS) ×2 IMPLANT
KNIFE CRESCENT 1.75 EDGEAHEAD (BLADE) ×2 IMPLANT
LENS IOL POST 1PIECE DIOP 13.0 (Intraocular Lens) ×1 IMPLANT
MARKER SKIN DUAL TIP RULER LAB (MISCELLANEOUS) IMPLANT
MASK EYE SHIELD (GAUZE/BANDAGES/DRESSINGS) ×1 IMPLANT
MICROPICK 25G (MISCELLANEOUS)
NDL 18GX1X1/2 (RX/OR ONLY) (NEEDLE) ×1 IMPLANT
NDL 25GX 5/8IN NON SAFETY (NEEDLE) ×1 IMPLANT
NDL FILTER BLUNT 18X1 1/2 (NEEDLE) IMPLANT
NDL HYPO 30X.5 LL (NEEDLE) ×1 IMPLANT
NEEDLE 18GX1X1/2 (RX/OR ONLY) (NEEDLE) ×2 IMPLANT
NEEDLE 25GX 5/8IN NON SAFETY (NEEDLE) ×2 IMPLANT
NEEDLE 27GAX1X1/2 (NEEDLE) IMPLANT
NEEDLE FILTER BLUNT 18X 1/2SAF (NEEDLE) ×1
NEEDLE FILTER BLUNT 18X1 1/2 (NEEDLE) ×1 IMPLANT
NEEDLE HYPO 30X.5 LL (NEEDLE) ×2 IMPLANT
NS IRRIG 1000ML POUR BTL (IV SOLUTION) ×2 IMPLANT
PACK VITRECTOMY CUSTOM (CUSTOM PROCEDURE TRAY) ×2 IMPLANT
PAD ARMBOARD 7.5X6 YLW CONV (MISCELLANEOUS) ×3 IMPLANT
PAD EYE OVAL STERILE LF (GAUZE/BANDAGES/DRESSINGS) ×1 IMPLANT
PAK VITRECTOMY PIK 25 GA (OPHTHALMIC RELATED) ×2 IMPLANT
PENCIL BIPOLAR 25GA STR DISP (OPHTHALMIC RELATED) IMPLANT
PICK MICROPICK 25G (MISCELLANEOUS) IMPLANT
PROBE DIRECTIONAL LASER (MISCELLANEOUS) IMPLANT
PROBE VITREOUS 25+ ACCURUS (MISCELLANEOUS) IMPLANT
ROLLS DENTAL (MISCELLANEOUS) ×4 IMPLANT
SCRAPER DIAMOND 25GA (OPHTHALMIC RELATED) IMPLANT
SPONGE SURGIFOAM ABS GEL 12-7 (HEMOSTASIS) ×1 IMPLANT
STOPCOCK 4 WAY LG BORE MALE ST (IV SETS) IMPLANT
STRIP CLOSURE SKIN 1/2X4 (GAUZE/BANDAGES/DRESSINGS) ×1 IMPLANT
SUT CHROMIC 7 0 TG140 8 (SUTURE) ×2 IMPLANT
SUT ETHILON 10 0 CS140 6 (SUTURE) ×1 IMPLANT
SUT ETHILON 9 0 BV100 4 (SUTURE) ×1 IMPLANT
SUT POLY NON ABSORB 10-0 8 STR (SUTURE) ×4 IMPLANT
SUT SILK 4 0 RB 1 (SUTURE) IMPLANT
SYR 20CC LL (SYRINGE) ×2 IMPLANT
SYR 5ML LL (SYRINGE) IMPLANT
SYR BULB 3OZ (MISCELLANEOUS) ×2 IMPLANT
SYR TB 1ML LUER SLIP (SYRINGE) ×2 IMPLANT
SYRINGE 10CC LL (SYRINGE) IMPLANT
TAPE SURG TRANSPORE 1 IN (GAUZE/BANDAGES/DRESSINGS) ×1 IMPLANT
TAPE SURGICAL TRANSPORE 1 IN (GAUZE/BANDAGES/DRESSINGS) ×1
TOWEL OR 17X24 6PK STRL BLUE (TOWEL DISPOSABLE) ×6 IMPLANT
TROCAR CANNULA 25GA (CANNULA) IMPLANT
WATER STERILE IRR 1000ML POUR (IV SOLUTION) ×2 IMPLANT
WIPE INSTRUMENT VISIWIPE 73X73 (MISCELLANEOUS) ×1 IMPLANT

## 2011-10-20 NOTE — H&P (Signed)
I examined the patient today and there is no change in the medical status 

## 2011-10-20 NOTE — Anesthesia Preprocedure Evaluation (Addendum)
Anesthesia Evaluation  Patient identified by MRN, date of birth, ID band Patient awake    Reviewed: Allergy & Precautions, H&P , NPO status , Patient's Chart, lab work & pertinent test results  Airway Mallampati: II TM Distance: >3 FB Neck ROM: Full    Dental No notable dental hx. (+) Teeth Intact   Pulmonary neg pulmonary ROS,  breath sounds clear to auscultation  Pulmonary exam normal       Cardiovascular hypertension, On Medications Rhythm:Regular Rate:Normal     Neuro/Psych PSYCHIATRIC DISORDERS negative neurological ROS     GI/Hepatic Neg liver ROS, GERD-  ,  Endo/Other  Hypothyroidism   Renal/GU negative Renal ROS  negative genitourinary   Musculoskeletal   Abdominal   Peds  Hematology negative hematology ROS (+)   Anesthesia Other Findings   Reproductive/Obstetrics negative OB ROS                          Anesthesia Physical Anesthesia Plan  ASA: II  Anesthesia Plan: General   Post-op Pain Management:    Induction: Intravenous  Airway Management Planned: Oral ETT  Additional Equipment:   Intra-op Plan:   Post-operative Plan: Extubation in OR  Informed Consent: I have reviewed the patients History and Physical, chart, labs and discussed the procedure including the risks, benefits and alternatives for the proposed anesthesia with the patient or authorized representative who has indicated his/her understanding and acceptance.     Plan Discussed with: CRNA  Anesthesia Plan Comments:         Anesthesia Quick Evaluation

## 2011-10-20 NOTE — Transfer of Care (Signed)
Immediate Anesthesia Transfer of Care Note  Patient: Jeffrey Gordon  Procedure(s) Performed: Procedure(s) (LRB): PARS PLANA VITRECTOMY WITH 25G REMOVAL/SUTURE INTRAOCULAR LENS (Left)  Patient Location: PACU  Anesthesia Type: General  Level of Consciousness: awake, alert  and oriented  Airway & Oxygen Therapy: Patient Spontanous Breathing and Patient connected to nasal cannula oxygen  Post-op Assessment: Report given to PACU RN, Post -op Vital signs reviewed and stable and Post -op Vital signs reviewed and unstable, Anesthesiologist notified  Post vital signs: Reviewed and stable  Complications: No apparent anesthesia complications

## 2011-10-20 NOTE — Anesthesia Procedure Notes (Addendum)
Procedure Name: Intubation Date/Time: 10/20/2011 11:33 AM Performed by: Elizbeth Squires Pre-anesthesia Checklist: Patient identified, Emergency Drugs available, Patient being monitored and Suction available Patient Re-evaluated:Patient Re-evaluated prior to inductionOxygen Delivery Method: Circle system utilized Preoxygenation: Pre-oxygenation with 100% oxygen Intubation Type: IV induction Ventilation: Mask ventilation without difficulty and Oral airway inserted - appropriate to patient size Laryngoscope Size: Mac and 3 Grade View: Grade II Tube type: Oral Tube size: 7.5 mm Number of attempts: 1 Airway Equipment and Method: Stylet Placement Confirmation: ETT inserted through vocal cords under direct vision,  positive ETCO2 and breath sounds checked- equal and bilateral Secured at: 22 cm Tube secured with: Tape Dental Injury: Teeth and Oropharynx as per pre-operative assessment

## 2011-10-20 NOTE — Anesthesia Postprocedure Evaluation (Signed)
  Anesthesia Post-op Note  Patient: Jeffrey Gordon  Procedure(s) Performed: Procedure(s) (LRB): PARS PLANA VITRECTOMY WITH 25G REMOVAL/SUTURE INTRAOCULAR LENS (Left)  Patient Location: PACU  Anesthesia Type: General  Level of Consciousness: awake  Airway and Oxygen Therapy: Patient Spontanous Breathing  Post-op Pain: none  Post-op Assessment: Post-op Vital signs reviewed  Post-op Vital Signs: stable  Complications: No apparent anesthesia complications

## 2011-10-20 NOTE — Brief Op Note (Signed)
Brief Operative note   Preoperative diagnosis:  Pre-Op Diagnosis Codes:    Posterior dislocation of lens Postoperative diagnosis  Post-Op Diagnosis Codes: same  Procedures: Pars plana vitrectomy, gas injection, removal of IOL from vit.  Secondary IOL with suture  Surgeon:  Sherrie George, MD...  Assistant:  Rosalie Doctor SA   Anesthesia: General  Specimen: none  Estimated blood loss:  1cc  Complications: none  Patient sent to PACU in good condition  Composed by Sherrie George MD  Dictation number: [phone]

## 2011-10-21 ENCOUNTER — Encounter (HOSPITAL_COMMUNITY): Payer: Self-pay | Admitting: Ophthalmology

## 2011-10-21 MED ORDER — PREDNISOLONE ACETATE 1 % OP SUSP: 1.0000 [drp] | Freq: Four times a day (QID) | OPHTHALMIC | Status: AC

## 2011-10-21 MED ORDER — BACITRACIN-POLYMYXIN B 500-10000 UNIT/GM OP OINT: TOPICAL_OINTMENT | Freq: Three times a day (TID) | OPHTHALMIC | Status: DC

## 2011-10-21 MED ORDER — GATIFLOXACIN 0.5 % OP SOLN: 1.0000 [drp] | Freq: Four times a day (QID) | OPHTHALMIC | Status: DC

## 2011-10-21 MED ORDER — BACITRACIN-POLYMYXIN B 500-10000 UNIT/GM OP OINT: TOPICAL_OINTMENT | Freq: Three times a day (TID) | OPHTHALMIC | Status: AC

## 2011-10-21 NOTE — Progress Notes (Signed)
DC home with wife. DC instructions given to pt and explained to by Dr. Ashley Royalty

## 2011-10-21 NOTE — Discharge Summary (Signed)
Discharge summary not needed on [facility] patients per medical records. 

## 2011-10-21 NOTE — Progress Notes (Signed)
10/21/2011, 6:49 AM  Mental Status:  Awake, Alert, Oriented  Anterior segment: Cornea  Clear    Anterior Chamber Clear    Lens:   IOL,  Intra Ocular Pressure 14 mmHg with Tonopen  Vitreous: Clear 10%gas bubble   Retina:  Attached Good laser reaction Hemorrhage    Impression: Excellent result Retina attached   Final Diagnosis: Active Problems:  Posterior dislocation of intraocular lens    Plan: start post operative eye drops.  Discharge to home.  Give post operative instructions  Sherrie George 10/21/2011, 6:49 AM

## 2011-10-21 NOTE — Op Note (Signed)
NAMESTEVENS, Jeffrey Gordon              ACCOUNT NO.:  [id number]  MEDICAL RECORD NO.:  [id number]  LOCATION:  5157                         FACILITY:  [facility]  PHYSICIAN:  Beulah Gandy. Ashley Royalty, M.D. DATE OF BIRTH:  1956-06-30  DATE OF PROCEDURE:  10/20/2011 DATE OF DISCHARGE:                              OPERATIVE REPORT   ADMISSION DIAGNOSIS:  Dislocated intraocular lens into the vitreous, left eye.  PROCEDURES: 1. Pars plana vitrectomy, left eye. 2. Removal of intraocular lens from vitreous, left eye. 3. Placement of secondary intraocular lens with suture. 4. Gas-fluid exchange, left eye.  SURGEON:  Beulah Gandy. Ashley Royalty, MD  ASSISTANT:  Rosalie Doctor, SA  ANESTHESIA:  General.  DETAILS:  Usual prep and drape.  Conjunctival peritomy from 8 o'clock around to 4 o'clock.  Half thickness scleral flaps raised at 3 and 9 o'clock in anticipation of IOL suture.  A 3-layered corneoscleral wound was created from 12 o'clock to 2:30 o'clock avoiding some thin areas in the cornea in the upper nasal quadrant.  25-gauge trocars were placed at 10, 2, and 4.  Infusion at 4 o'clock.  The pars plana vitrectomy was begun just behind the visual axis and the intraocular lens was seen immediately.  The vitreous was teased from its attachments to the intraocular lens, and the lens was allowed to be free.  Keratome incision of the cornea was performed for 7 mm in width.  The 25-gauge forceps were used to grasp the lens and passed it into the anterior chamber.  It was then drawn out through the corneoscleral wound with the serrated 20-gauge forceps.  It was dialed out of the eye.  Additional vitrectomy was carried out removing all vitreous attachments to the wound and to the iris.  The vitrectomy was carried posteriorly and additional vitreous was removed in a core fashion and then in a peripheral fashion, all vitreous was removed.  Two 10-0 Prolene sutures were then passed beneath the scleral flaps from 3 o'clock  to 9 o'clock posterior to the iris and anterior to the capsular remnants.  Very few remnants remained.  Two sutures were externalized through the corneoscleral wound.  A new intraocular lens made by [employer] model CZ70BD, power 13.0D, length 12.5 mm, optic 7.0 mm was brought onto the field and cleaned, serial number [id number], expiration date April 2015.  The lens was inspected and cleaned.  The Prolenes were attached to the eyelets of the lens.  The lens was passed into the anterior chamber, then into the posterior chamber into the ciliary sulcus.  It was dialed into place.  Once it was in good position, the Prolene sutures were knotted externally beneath the scleral flaps.  Scleral flaps were allowed to cover the knots.  The corneal wound was closed with 5 interrupted 10-0 nylon sutures.  The wound was tested and found to be tight.  Additional vitrectomy was performed removing some pigment granules from the vitreous cavity.  Once it was totally clean, a 30% gas-fluid exchange was performed.  The trocars were removed from the eye and the wounds were tested.  They were found to be secure.  The conjunctiva was reposited with 7-0 chromic suture.  Polymyxin and gentamicin were irrigated into tenon space. Atropine solution was applied.  Decadron 10 mg was injected to the lower subconjunctival space.  Marcaine was injected around the globe for postop pain.  Closing pressure was 15 with a Barraquer tonometer.  COMPLICATIONS:  None.  DURATION:  1 hour 15 minutes.  The patient was awakened and taken to recovery in satisfactory condition.     Beulah Gandy. Ashley Royalty, M.D.     JDM/MEDQ  D:  10/20/2011  T:  10/21/2011  Job:  [id number]

## 2011-10-26 ENCOUNTER — Inpatient Hospital Stay (INDEPENDENT_AMBULATORY_CARE_PROVIDER_SITE_OTHER): Payer: 59 | Admitting: Ophthalmology

## 2011-10-26 DIAGNOSIS — H2700 Aphakia, unspecified eye: Secondary | ICD-10-CM

## 2011-11-10 ENCOUNTER — Ambulatory Visit (INDEPENDENT_AMBULATORY_CARE_PROVIDER_SITE_OTHER): Payer: 59 | Admitting: Ophthalmology

## 2011-11-10 DIAGNOSIS — H2700 Aphakia, unspecified eye: Secondary | ICD-10-CM

## 2011-12-22 ENCOUNTER — Encounter (INDEPENDENT_AMBULATORY_CARE_PROVIDER_SITE_OTHER): Payer: 59 | Admitting: Ophthalmology

## 2011-12-22 DIAGNOSIS — H2700 Aphakia, unspecified eye: Secondary | ICD-10-CM

## 2012-04-30 ENCOUNTER — Ambulatory Visit (INDEPENDENT_AMBULATORY_CARE_PROVIDER_SITE_OTHER): Payer: 59 | Admitting: Ophthalmology

## 2012-05-08 ENCOUNTER — Ambulatory Visit (INDEPENDENT_AMBULATORY_CARE_PROVIDER_SITE_OTHER): Payer: 59 | Admitting: Ophthalmology

## 2012-05-08 DIAGNOSIS — I10 Essential (primary) hypertension: Secondary | ICD-10-CM

## 2012-05-08 DIAGNOSIS — H35039 Hypertensive retinopathy, unspecified eye: Secondary | ICD-10-CM

## 2012-05-08 DIAGNOSIS — H43819 Vitreous degeneration, unspecified eye: Secondary | ICD-10-CM

## 2012-05-08 DIAGNOSIS — H33009 Unspecified retinal detachment with retinal break, unspecified eye: Secondary | ICD-10-CM

## 2012-05-08 DIAGNOSIS — H33309 Unspecified retinal break, unspecified eye: Secondary | ICD-10-CM

## 2012-06-12 ENCOUNTER — Encounter: Payer: Self-pay | Admitting: Internal Medicine

## 2012-07-09 ENCOUNTER — Encounter: Payer: Self-pay | Admitting: Internal Medicine

## 2012-08-13 ENCOUNTER — Ambulatory Visit (AMBULATORY_SURGERY_CENTER): Payer: 59 | Admitting: *Deleted

## 2012-08-13 ENCOUNTER — Encounter: Payer: Self-pay | Admitting: Internal Medicine

## 2012-08-13 VITALS — Ht 75.5 in | Wt 226.0 lb

## 2012-08-13 DIAGNOSIS — Z1211 Encounter for screening for malignant neoplasm of colon: Secondary | ICD-10-CM

## 2012-08-13 MED ORDER — MOVIPREP 100 G PO SOLR: ORAL | Status: DC

## 2012-09-03 ENCOUNTER — Encounter: Payer: Self-pay | Admitting: Internal Medicine

## 2012-09-03 ENCOUNTER — Ambulatory Visit (AMBULATORY_SURGERY_CENTER): Payer: 59 | Admitting: Internal Medicine

## 2012-09-03 VITALS — BP 122/82 | HR 52 | Temp 98.7°F | Resp 13 | Ht 75.5 in | Wt 226.0 lb

## 2012-09-03 DIAGNOSIS — Z800 Family history of malignant neoplasm of digestive organs: Secondary | ICD-10-CM

## 2012-09-03 DIAGNOSIS — Z1211 Encounter for screening for malignant neoplasm of colon: Secondary | ICD-10-CM

## 2012-09-03 DIAGNOSIS — Z86010 Personal history of colonic polyps: Secondary | ICD-10-CM

## 2012-09-03 MED ORDER — SODIUM CHLORIDE 0.9 % IV SOLN: 500.0000 mL | INTRAVENOUS | Status: DC

## 2012-09-03 NOTE — Op Note (Signed)
[facility] [address] [place], [address]   COLONOSCOPY PROCEDURE REPORT  PATIENT: Jeffrey Gordon, Jeffrey Gordon.  MR#: [id number] BIRTHDATE: [birth year] , 56  yrs. old GENDER: Male ENDOSCOPIST: Roxy Cedar, MD REFERRED [id number] Program Recall PROCEDURE DATE:  09/03/2012 PROCEDURE:   Colonoscopy, surveillance ASA CLASS:   Class II INDICATIONS:Patient's immediate family history of colon cancer (parent 72) and Patient's personal history of colon polyps (10mm sssile right sided hpp). Prior exams 2003,2008. MEDICATIONS: MAC sedation, administered by CRNA and propofol (Diprivan) 300mg  IV  DESCRIPTION OF PROCEDURE:   After the risks benefits and alternatives of the procedure were thoroughly explained, informed consent was obtained.  A digital rectal exam revealed no abnormalities of the rectum.   The LB CF-[id number]  endoscope was introduced through the anus and advanced to the cecum, which was identified by both the appendix and ileocecal valve. No adverse events experienced.   The quality of the prep was adequate, using MoviPrep  The instrument was then slowly withdrawn as the colon was fully examined.      COLON FINDINGS: A normal appearing cecum, ileocecal valve, and appendiceal orifice were identified.  The ascending, hepatic flexure, transverse, splenic flexure, descending, sigmoid colon and rectum appeared unremarkable.  No polyps or cancers were seen. Retroflexed views revealed no abnormalities. The time to cecum=4 minutes 55 seconds.  Withdrawal time=8 minutes 09 seconds.  The scope was withdrawn and the procedure completed. COMPLICATIONS: There were no complications.  ENDOSCOPIC IMPRESSION: Normal colon  RECOMMENDATIONS: Follow up colonoscopy in 5 years   eSigned:  Roxy Cedar, MD 09/03/2012 12:20 PM   cc: Assunta Found, MD and The Patient

## 2012-09-03 NOTE — Patient Instructions (Signed)
YOU HAD AN ENDOSCOPIC PROCEDURE TODAY AT THE [facility]: Refer to the procedure report that was given to you for any specific questions about what was found during the examination.  If the procedure report does not answer your questions, please call your gastroenterologist to clarify.  If you requested that your care partner not be given the details of your procedure findings, then the procedure report has been included in a sealed envelope for you to review at your convenience later.  YOU SHOULD EXPECT: Some feelings of bloating in the abdomen. Passage of more gas than usual.  Walking can help get rid of the air that was put into your GI tract during the procedure and reduce the bloating. If you had a lower endoscopy (such as a colonoscopy or flexible sigmoidoscopy) you may notice spotting of blood in your stool or on the toilet paper. If you underwent a bowel prep for your procedure, then you may not have a normal bowel movement for a few days.  DIET: Your first meal following the procedure should be a light meal and then it is ok to progress to your normal diet.  A half-sandwich or bowl of soup is an example of a good first meal.  Heavy or fried foods are harder to digest and may make you feel nauseous or bloated.  Likewise meals heavy in dairy and vegetables can cause extra gas to form and this can also increase the bloating.  Drink plenty of fluids but you should avoid alcoholic beverages for 24 hours.  ACTIVITY: Your care partner should take you home directly after the procedure.  You should plan to take it easy, moving slowly for the rest of the day.  You can resume normal activity the day after the procedure however you should NOT DRIVE or use heavy machinery for 24 hours (because of the sedation medicines used during the test).    SYMPTOMS TO REPORT IMMEDIATELY: A gastroenterologist can be reached at any hour.  During normal business hours, 8:30 AM to 5:00 PM Monday through Friday,  call [phone].  After hours and on weekends, please call the GI answering service at [phone] who will take a message and have the physician on call contact you.   Following lower endoscopy (colonoscopy or flexible sigmoidoscopy):  Excessive amounts of blood in the stool  Significant tenderness or worsening of abdominal pains  Swelling of the abdomen that is new, acute  Fever of 100F or higher    FOLLOW UP: If any biopsies were taken you will be contacted by phone or by letter within the next 1-3 weeks.  Call your gastroenterologist if you have not heard about the biopsies in 3 weeks.  Our staff will call the home number listed on your records the next business day following your procedure to check on you and address any questions or concerns that you may have at that time regarding the information given to you following your procedure. This is a courtesy call and so if there is no answer at the home number and we have not heard from you through the emergency physician on call, we will assume that you have returned to your regular daily activities without incident.  SIGNATURES/CONFIDENTIALITY: You and/or your care partner have signed paperwork which will be entered into your electronic medical record.  These signatures attest to the fact that that the information above on your After Visit Summary has been reviewed and is understood.  Full responsibility of the confidentiality   of this discharge information lies with you and/or your care-partner.     

## 2012-09-03 NOTE — Progress Notes (Signed)
Patient did not have preoperative order for IV antibiotic SSI prophylaxis. (G8918)  Patient did not experience any of the following events: a burn prior to discharge; a fall within the facility; wrong site/side/patient/procedure/implant event; or a hospital transfer or hospital admission upon discharge from the facility. (G8907)  

## 2012-09-03 NOTE — Progress Notes (Signed)
Patient did not experience any of the following events: a burn prior to discharge; a fall within the facility; wrong site/side/patient/procedure/implant event; or a hospital transfer or hospital admission upon discharge from the facility. (G8907) Patient did not have preoperative order for IV antibiotic SSI prophylaxis. (G8918)  

## 2012-09-04 ENCOUNTER — Telehealth: Payer: Self-pay | Admitting: *Deleted

## 2012-09-04 NOTE — Telephone Encounter (Signed)
  Follow up Call-  Call back number 09/03/2012  Post procedure Call Back phone  # [phone]  Permission to leave phone message Yes     Patient questions:  Do you have a fever, pain , or abdominal swelling? no Pain Score  0 *  Have you tolerated food without any problems? yes  Have you been able to return to your normal activities? yes  Do you have any questions about your discharge instructions: Diet   no Medications  no Follow up visit  no  Do you have questions or concerns about your Care? no  Actions: * If pain score is 4 or above: No action needed, pain <4.

## 2013-05-08 ENCOUNTER — Ambulatory Visit (INDEPENDENT_AMBULATORY_CARE_PROVIDER_SITE_OTHER): Payer: 59 | Admitting: Ophthalmology

## 2013-05-13 ENCOUNTER — Ambulatory Visit (INDEPENDENT_AMBULATORY_CARE_PROVIDER_SITE_OTHER): Payer: 59 | Admitting: Ophthalmology

## 2013-05-13 DIAGNOSIS — H33309 Unspecified retinal break, unspecified eye: Secondary | ICD-10-CM

## 2013-05-13 DIAGNOSIS — H35039 Hypertensive retinopathy, unspecified eye: Secondary | ICD-10-CM

## 2013-05-13 DIAGNOSIS — I10 Essential (primary) hypertension: Secondary | ICD-10-CM

## 2013-05-13 DIAGNOSIS — H43819 Vitreous degeneration, unspecified eye: Secondary | ICD-10-CM

## 2013-05-13 DIAGNOSIS — H33009 Unspecified retinal detachment with retinal break, unspecified eye: Secondary | ICD-10-CM

## 2014-04-02 ENCOUNTER — Encounter: Payer: Self-pay | Admitting: Internal Medicine

## 2014-05-16 ENCOUNTER — Ambulatory Visit (INDEPENDENT_AMBULATORY_CARE_PROVIDER_SITE_OTHER): Payer: 59 | Admitting: Ophthalmology

## 2014-06-09 ENCOUNTER — Ambulatory Visit (INDEPENDENT_AMBULATORY_CARE_PROVIDER_SITE_OTHER): Payer: BC Managed Care – PPO | Admitting: Ophthalmology

## 2014-06-09 DIAGNOSIS — I10 Essential (primary) hypertension: Secondary | ICD-10-CM

## 2014-06-09 DIAGNOSIS — H338 Other retinal detachments: Secondary | ICD-10-CM

## 2014-06-09 DIAGNOSIS — H43813 Vitreous degeneration, bilateral: Secondary | ICD-10-CM

## 2014-06-09 DIAGNOSIS — H2702 Aphakia, left eye: Secondary | ICD-10-CM

## 2015-06-11 ENCOUNTER — Ambulatory Visit (INDEPENDENT_AMBULATORY_CARE_PROVIDER_SITE_OTHER): Payer: BLUE CROSS/BLUE SHIELD | Admitting: Ophthalmology

## 2015-06-11 DIAGNOSIS — H338 Other retinal detachments: Secondary | ICD-10-CM | POA: Diagnosis not present

## 2015-06-11 DIAGNOSIS — H35033 Hypertensive retinopathy, bilateral: Secondary | ICD-10-CM

## 2015-06-11 DIAGNOSIS — H353121 Nonexudative age-related macular degeneration, left eye, early dry stage: Secondary | ICD-10-CM

## 2015-06-11 DIAGNOSIS — I10 Essential (primary) hypertension: Secondary | ICD-10-CM

## 2015-06-11 DIAGNOSIS — H2702 Aphakia, left eye: Secondary | ICD-10-CM

## 2016-03-03 DIAGNOSIS — Z1389 Encounter for screening for other disorder: Secondary | ICD-10-CM | POA: Diagnosis not present

## 2016-03-03 DIAGNOSIS — I10 Essential (primary) hypertension: Secondary | ICD-10-CM | POA: Diagnosis not present

## 2016-03-03 DIAGNOSIS — Z6829 Body mass index (BMI) 29.0-29.9, adult: Secondary | ICD-10-CM | POA: Diagnosis not present

## 2016-03-03 DIAGNOSIS — E782 Mixed hyperlipidemia: Secondary | ICD-10-CM | POA: Diagnosis not present

## 2016-03-03 DIAGNOSIS — N400 Enlarged prostate without lower urinary tract symptoms: Secondary | ICD-10-CM | POA: Diagnosis not present

## 2016-03-04 DIAGNOSIS — E782 Mixed hyperlipidemia: Secondary | ICD-10-CM | POA: Diagnosis not present

## 2016-03-04 DIAGNOSIS — N400 Enlarged prostate without lower urinary tract symptoms: Secondary | ICD-10-CM | POA: Diagnosis not present

## 2016-03-04 DIAGNOSIS — Z1389 Encounter for screening for other disorder: Secondary | ICD-10-CM | POA: Diagnosis not present

## 2016-03-04 DIAGNOSIS — I10 Essential (primary) hypertension: Secondary | ICD-10-CM | POA: Diagnosis not present

## 2016-04-02 DIAGNOSIS — Z23 Encounter for immunization: Secondary | ICD-10-CM | POA: Diagnosis not present

## 2016-06-14 ENCOUNTER — Ambulatory Visit (INDEPENDENT_AMBULATORY_CARE_PROVIDER_SITE_OTHER): Payer: BLUE CROSS/BLUE SHIELD | Admitting: Ophthalmology

## 2016-06-14 DIAGNOSIS — H35033 Hypertensive retinopathy, bilateral: Secondary | ICD-10-CM

## 2016-06-14 DIAGNOSIS — I10 Essential (primary) hypertension: Secondary | ICD-10-CM

## 2016-06-14 DIAGNOSIS — H353121 Nonexudative age-related macular degeneration, left eye, early dry stage: Secondary | ICD-10-CM

## 2016-06-14 DIAGNOSIS — H338 Other retinal detachments: Secondary | ICD-10-CM | POA: Diagnosis not present

## 2016-06-14 DIAGNOSIS — H43813 Vitreous degeneration, bilateral: Secondary | ICD-10-CM

## 2016-08-17 DIAGNOSIS — J0100 Acute maxillary sinusitis, unspecified: Secondary | ICD-10-CM | POA: Diagnosis not present

## 2016-08-17 DIAGNOSIS — Z1389 Encounter for screening for other disorder: Secondary | ICD-10-CM | POA: Diagnosis not present

## 2016-08-17 DIAGNOSIS — Z6830 Body mass index (BMI) 30.0-30.9, adult: Secondary | ICD-10-CM | POA: Diagnosis not present

## 2016-08-17 DIAGNOSIS — H109 Unspecified conjunctivitis: Secondary | ICD-10-CM | POA: Diagnosis not present

## 2016-09-08 DIAGNOSIS — E782 Mixed hyperlipidemia: Secondary | ICD-10-CM | POA: Diagnosis not present

## 2016-09-08 DIAGNOSIS — E785 Hyperlipidemia, unspecified: Secondary | ICD-10-CM | POA: Diagnosis not present

## 2016-09-08 DIAGNOSIS — E039 Hypothyroidism, unspecified: Secondary | ICD-10-CM | POA: Diagnosis not present

## 2016-09-08 DIAGNOSIS — Z1389 Encounter for screening for other disorder: Secondary | ICD-10-CM | POA: Diagnosis not present

## 2016-09-08 DIAGNOSIS — Z6829 Body mass index (BMI) 29.0-29.9, adult: Secondary | ICD-10-CM | POA: Diagnosis not present

## 2016-09-08 DIAGNOSIS — I10 Essential (primary) hypertension: Secondary | ICD-10-CM | POA: Diagnosis not present

## 2016-09-08 DIAGNOSIS — E663 Overweight: Secondary | ICD-10-CM | POA: Diagnosis not present

## 2017-03-08 DIAGNOSIS — E039 Hypothyroidism, unspecified: Secondary | ICD-10-CM | POA: Diagnosis not present

## 2017-03-08 DIAGNOSIS — I10 Essential (primary) hypertension: Secondary | ICD-10-CM | POA: Diagnosis not present

## 2017-03-08 DIAGNOSIS — Z1389 Encounter for screening for other disorder: Secondary | ICD-10-CM | POA: Diagnosis not present

## 2017-03-08 DIAGNOSIS — Z6830 Body mass index (BMI) 30.0-30.9, adult: Secondary | ICD-10-CM | POA: Diagnosis not present

## 2017-03-08 DIAGNOSIS — F320 Major depressive disorder, single episode, mild: Secondary | ICD-10-CM | POA: Diagnosis not present

## 2017-03-08 DIAGNOSIS — E782 Mixed hyperlipidemia: Secondary | ICD-10-CM | POA: Diagnosis not present

## 2017-03-09 DIAGNOSIS — F320 Major depressive disorder, single episode, mild: Secondary | ICD-10-CM | POA: Diagnosis not present

## 2017-03-09 DIAGNOSIS — E782 Mixed hyperlipidemia: Secondary | ICD-10-CM | POA: Diagnosis not present

## 2017-03-09 DIAGNOSIS — Z1389 Encounter for screening for other disorder: Secondary | ICD-10-CM | POA: Diagnosis not present

## 2017-03-09 DIAGNOSIS — E039 Hypothyroidism, unspecified: Secondary | ICD-10-CM | POA: Diagnosis not present

## 2017-03-13 DIAGNOSIS — R739 Hyperglycemia, unspecified: Secondary | ICD-10-CM | POA: Diagnosis not present

## 2017-06-19 ENCOUNTER — Ambulatory Visit (INDEPENDENT_AMBULATORY_CARE_PROVIDER_SITE_OTHER): Payer: BLUE CROSS/BLUE SHIELD | Admitting: Ophthalmology

## 2017-06-29 ENCOUNTER — Encounter (INDEPENDENT_AMBULATORY_CARE_PROVIDER_SITE_OTHER): Payer: BLUE CROSS/BLUE SHIELD | Admitting: Ophthalmology

## 2017-06-29 DIAGNOSIS — H338 Other retinal detachments: Secondary | ICD-10-CM

## 2017-06-29 DIAGNOSIS — H353131 Nonexudative age-related macular degeneration, bilateral, early dry stage: Secondary | ICD-10-CM

## 2017-06-29 DIAGNOSIS — H43813 Vitreous degeneration, bilateral: Secondary | ICD-10-CM

## 2017-06-29 DIAGNOSIS — H35033 Hypertensive retinopathy, bilateral: Secondary | ICD-10-CM

## 2017-06-29 DIAGNOSIS — I10 Essential (primary) hypertension: Secondary | ICD-10-CM

## 2017-09-12 DIAGNOSIS — I10 Essential (primary) hypertension: Secondary | ICD-10-CM | POA: Diagnosis not present

## 2017-09-12 DIAGNOSIS — E782 Mixed hyperlipidemia: Secondary | ICD-10-CM | POA: Diagnosis not present

## 2017-09-12 DIAGNOSIS — Z6829 Body mass index (BMI) 29.0-29.9, adult: Secondary | ICD-10-CM | POA: Diagnosis not present

## 2017-09-12 DIAGNOSIS — E039 Hypothyroidism, unspecified: Secondary | ICD-10-CM | POA: Diagnosis not present

## 2017-09-12 DIAGNOSIS — Z1389 Encounter for screening for other disorder: Secondary | ICD-10-CM | POA: Diagnosis not present

## 2017-09-14 DIAGNOSIS — J209 Acute bronchitis, unspecified: Secondary | ICD-10-CM | POA: Diagnosis not present

## 2017-09-14 DIAGNOSIS — J111 Influenza due to unidentified influenza virus with other respiratory manifestations: Secondary | ICD-10-CM | POA: Diagnosis not present

## 2017-10-11 ENCOUNTER — Encounter: Payer: Self-pay | Admitting: Internal Medicine

## 2017-11-01 ENCOUNTER — Encounter: Payer: Self-pay | Admitting: Internal Medicine

## 2017-11-01 DIAGNOSIS — J189 Pneumonia, unspecified organism: Secondary | ICD-10-CM | POA: Diagnosis not present

## 2017-11-24 ENCOUNTER — Ambulatory Visit: Payer: BLUE CROSS/BLUE SHIELD | Admitting: Pulmonary Disease

## 2017-11-24 ENCOUNTER — Encounter: Payer: Self-pay | Admitting: Pulmonary Disease

## 2017-11-24 VITALS — BP 124/78 | HR 86 | Ht 75.0 in | Wt 233.0 lb

## 2017-11-24 DIAGNOSIS — R0600 Dyspnea, unspecified: Secondary | ICD-10-CM | POA: Diagnosis not present

## 2017-11-24 DIAGNOSIS — R0689 Other abnormalities of breathing: Secondary | ICD-10-CM

## 2017-11-24 NOTE — Patient Instructions (Signed)
We will schedule you for pulmonary function next Follow-up in clinic on the same day to review results.

## 2017-11-24 NOTE — Progress Notes (Signed)
Jeffrey Gordon    [id number]    [birth year]  Primary Care Physician:Golding, Jenny Reichmann, MD  Referring Physician: Sharilyn Sites, MD [address] [place], [place] [address]  Chief complaint: Consult for COPD  HPI: 62 year old with past medical history of GERD, hypothyroidism, hyperlipidemia He had a couple of episodes of pneumonia.  Evaluated in urgent care and given antibiotics and prednisone.  He had a chest x-ray that showed hyperinflation and was told he has COPD.  He is here to get himself evaluated  He feels back to baseline.  Denies any dyspnea on exertion, cough, sputum production, mucus, wheezing. 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  No other significant lung issues.  No family history of lung problems except for pulmonary embolism in his grandfather after immobilization secondary to surgery.  Pets: Cat, no birds, farm animals  Occupation: Retired Optometrist Exposures: No known exposures, no mold, hot tub Smoking history: Smoked for a year high school Travel History: Lived in [place], [place], [place], [place] and [place]  Outpatient Encounter Medications as of 11/24/2017  Medication Sig  . aspirin EC 81 MG tablet Take 81 mg by mouth daily.  . CO ENZYME Q-10 PO Take 1 tablet by mouth daily.  Marland Kitchen doxazosin (CARDURA) 8 MG tablet Take 4 mg by mouth at bedtime.  Marland Kitchen FLUoxetine (PROZAC) 40 MG capsule Take 40 mg by mouth daily.  Marland Kitchen levothyroxine (SYNTHROID, LEVOTHROID) 88 MCG tablet Take 88 mcg by mouth daily.  Marland Kitchen losartan (COZAAR) 100 MG tablet Take 100 mg by mouth daily.  Vladimir Faster Glycol-Propyl Glycol (SYSTANE) 0.4-0.3 % SOLN Place 1 drop into both eyes daily as needed. For irritation  . rosuvastatin (CRESTOR) 10 MG tablet Take 5 mg by mouth daily.  . sildenafil (VIAGRA) 100 MG tablet Take 100 mg by mouth daily as needed.  . diazepam (VALIUM) 2 MG tablet Take 1 mg by mouth daily as needed. For anxiety  . [DISCONTINUED] zolpidem (AMBIEN) 10  MG tablet Take 10 mg by mouth at bedtime as needed. For sleep   No facility-administered encounter medications on file as of 11/24/2017.     Allergies as of 11/24/2017 - Review Complete 11/24/2017  Allergen Reaction Noted  . Durezol [difluprednate] Other (See Comments) 10/20/2011  . Advair diskus [fluticasone-salmeterol] Other (See Comments) 11/24/2017    Past Medical History:  Diagnosis Date  . Anxiety   . Blurred vision, left eye   . Depression   . GERD (gastroesophageal reflux disease)   . High cholesterol   . Hypertension   . Hypothyroidism     Past Surgical History:  Procedure Laterality Date  . CATARACT EXTRACTION W/ INTRAOCULAR LENS  IMPLANT, BILATERAL     2002 (left); 2012 (right)  . EYE SURGERY    . [place]   left  . PARS PLANA VITRECTOMY  10/20/11   "took out the old lens implant and put a new one in"  . PARS PLANA VITRECTOMY  10/20/2011   Procedure: PARS PLANA VITRECTOMY WITH 25G REMOVAL/SUTURE INTRAOCULAR LENS;  Surgeon: Hayden Pedro, MD;  Location: [facility];  Service: Ophthalmology;  Laterality: Left;  . RETINAL DETACHMENT SURGERY  2002   left  . RETINAL DETACHMENT SURGERY  2003   "repair; left"    Family History  Problem Relation Age of Onset  . Colon cancer Father     Social History   Socioeconomic History  . Marital status: Married  Spouse name: Not on file  . Number of children: Not on file  . Years of education: Not on file  . Highest education level: Not on file  Occupational History  . Not on file  Social Needs  . Financial resource strain: Not on file  . Food insecurity:    Worry: Not on file    Inability: Not on file  . Transportation needs:    Medical: Not on file    Non-medical: Not on file  Tobacco Use  . Smoking status: Never Smoker  . Smokeless tobacco: Never Used  . Tobacco comment: "tried smoking briefly as a teen"  Substance and Sexual Activity  . Alcohol use: Yes    Comment: 10/20/11 "last alcohol `  2010"  . Drug use: No  . Sexual activity: Yes  Lifestyle  . Physical activity:    Days per week: Not on file    Minutes per session: Not on file  . Stress: Not on file  Relationships  . Social connections:    Talks on phone: Not on file    Gets together: Not on file    Attends religious service: Not on file    Active member of club or organization: Not on file    Attends meetings of clubs or organizations: Not on file    Relationship status: Not on file  . Intimate partner violence:    Fear of current or ex partner: Not on file    Emotionally abused: Not on file    Physically abused: Not on file    Forced sexual activity: Not on file  Other Topics Concern  . Not on file  Social History Narrative  . Not on file    Review of systems: Review of Systems  Constitutional: Negative for fever and chills.  HENT: Negative.   Eyes: Negative for blurred vision.  Respiratory: as per HPI  Cardiovascular: Negative for chest pain and palpitations.  Gastrointestinal: Negative for vomiting, diarrhea, blood per rectum. Genitourinary: Negative for dysuria, urgency, frequency and hematuria.  Musculoskeletal: Negative for myalgias, back pain and joint pain.  Skin: Negative for itching and rash.  Neurological: Negative for dizziness, tremors, focal weakness, seizures and loss of consciousness.  Endo/Heme/Allergies: Negative for environmental allergies.  Psychiatric/Behavioral: Negative for depression, suicidal ideas and hallucinations.  All other systems reviewed and are negative.  Physical Exam: Blood pressure 124/78, pulse 86, height 6\' 3"  (1.905 m), weight 233 lb (105.7 kg), SpO2 97 %. Gen:      No acute distress HEENT:  EOMI, sclera anicteric Neck:     No masses; no thyromegaly Lungs:    Clear to auscultation bilaterally; normal respiratory effort CV:         Regular rate and rhythm; no murmurs Abd:      + bowel sounds; soft, non-tender; no palpable masses, no distension Ext:    No  edema; adequate peripheral perfusion Skin:      Warm and dry; no rash Neuro: alert and oriented x 3 Psych: normal mood and affect  Data Reviewed: Chest x-ray 11/01/17- no acute lung abnormality, hyperinflated lungs, calcified granulomas in the left lung stable since 2007 I have reviewed the images personally  Assessment:  Evaluation for COPD Chest x-ray noted with hyperinflation.  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  The granulomas have been stable since at least 2007 on  review of his chest x-rays.  Plan/Recommendations: - PFTs  Marshell Garfinkel MD [facility] 11/24/2017, 1:36 PM  CC: Sharilyn Sites, MD

## 2017-12-19 ENCOUNTER — Encounter: Payer: Self-pay | Admitting: *Deleted

## 2017-12-21 ENCOUNTER — Other Ambulatory Visit: Payer: Self-pay | Admitting: Pulmonary Disease

## 2017-12-21 DIAGNOSIS — R0600 Dyspnea, unspecified: Secondary | ICD-10-CM

## 2017-12-22 ENCOUNTER — Other Ambulatory Visit: Payer: BLUE CROSS/BLUE SHIELD

## 2017-12-22 ENCOUNTER — Ambulatory Visit: Payer: BLUE CROSS/BLUE SHIELD | Admitting: Pulmonary Disease

## 2017-12-22 ENCOUNTER — Encounter: Payer: Self-pay | Admitting: Pulmonary Disease

## 2017-12-22 ENCOUNTER — Ambulatory Visit (INDEPENDENT_AMBULATORY_CARE_PROVIDER_SITE_OTHER): Payer: BLUE CROSS/BLUE SHIELD | Admitting: Pulmonary Disease

## 2017-12-22 VITALS — BP 138/80 | HR 86 | Ht 75.5 in | Wt 228.0 lb

## 2017-12-22 DIAGNOSIS — J441 Chronic obstructive pulmonary disease with (acute) exacerbation: Secondary | ICD-10-CM

## 2017-12-22 DIAGNOSIS — Z23 Encounter for immunization: Secondary | ICD-10-CM

## 2017-12-22 DIAGNOSIS — R0600 Dyspnea, unspecified: Secondary | ICD-10-CM

## 2017-12-22 DIAGNOSIS — J439 Emphysema, unspecified: Secondary | ICD-10-CM

## 2017-12-22 LAB — PULMONARY FUNCTION TEST
DL/VA % pred: 87 %
DL/VA: 4.3 ml/min/mmHg/L
DLCO UNC: 34.35 ml/min/mmHg
DLCO unc % pred: 86 %
FEF 25-75 Post: 2.37 L/sec
FEF 25-75 Pre: 2.02 L/sec
FEF2575-%Change-Post: 17 %
FEF2575-%PRED-POST: 70 %
FEF2575-%Pred-Pre: 60 %
FEV1-%CHANGE-POST: 4 %
FEV1-%Pred-Post: 83 %
FEV1-%Pred-Pre: 79 %
FEV1-Post: 3.52 L
FEV1-Pre: 3.37 L
FEV1FVC-%Change-Post: 3 %
FEV1FVC-%Pred-Pre: 89 %
FEV6-%Change-Post: 1 %
FEV6-%PRED-POST: 92 %
FEV6-%Pred-Pre: 91 %
FEV6-Post: 5 L
FEV6-Pre: 4.93 L
FEV6FVC-%CHANGE-POST: 0 %
FEV6FVC-%Pred-Post: 103 %
FEV6FVC-%Pred-Pre: 102 %
FVC-%Change-Post: 0 %
FVC-%PRED-PRE: 88 %
FVC-%Pred-Post: 89 %
FVC-Post: 5.03 L
FVC-Pre: 5 L
Post FEV1/FVC ratio: 70 %
Post FEV6/FVC ratio: 99 %
Pre FEV1/FVC ratio: 67 %
Pre FEV6/FVC Ratio: 99 %
RV % pred: 165 %
RV: 4.32 L
TLC % pred: 116 %
TLC: 9.45 L

## 2017-12-22 NOTE — Patient Instructions (Signed)
Your lung function test to confirm mild COPD.  We will continue to monitor this as you do not have any symptoms Check blood test today for alpha-1 antitrypsin levels and phenotype We will give you a Pneumovax today Follow-up in 1 year with repeat pulmonary function test.  Please call sooner if you have any change in symptoms.

## 2017-12-22 NOTE — Progress Notes (Signed)
PFT completed today.  

## 2017-12-22 NOTE — Progress Notes (Signed)
Jeffrey Gordon    [id number]    [birth year]  Primary Care Physician:Golding, Jenny Reichmann, MD  Referring Physician: Sharilyn Sites, MD [address] [place], [place] [address]  Chief complaint: Follow-up for COPD  HPI: 62 year old with past medical history of GERD, hypothyroidism, hyperlipidemia He had a couple of episodes of pneumonia.  Evaluated in urgent care and given antibiotics and prednisone.  He had a chest x-ray that showed hyperinflation and was told he has COPD.  He is here to get himself evaluated  He feels back to baseline.  Denies any dyspnea on exertion, cough, sputum production, mucus, wheezing. 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  No other significant lung issues.  No family history of lung problems except for pulmonary embolism in his grandfather after immobilization secondary to surgery.  Pets: Cat, no birds, farm animals  Occupation: Retired Optometrist Exposures: No known exposures, no mold, hot tub Smoking history: Smoked for a year high school Travel History: Lived in [place], [place], [place], [place] and [place]  Interim history: He returns for review of his PFTs.  Asymptomatic.  Denies any dyspnea, cough, sputum production, wheezing.  Outpatient Encounter Medications as of 12/22/2017  Medication Sig  . aspirin EC 81 MG tablet Take 81 mg by mouth daily.  . CO ENZYME Q-10 PO Take 1 tablet by mouth daily.  Marland Kitchen doxazosin (CARDURA) 8 MG tablet Take 4 mg by mouth at bedtime.  Marland Kitchen FLUoxetine (PROZAC) 40 MG capsule Take 40 mg by mouth daily.  Marland Kitchen levothyroxine (SYNTHROID, LEVOTHROID) 88 MCG tablet Take 88 mcg by mouth daily.  Marland Kitchen losartan (COZAAR) 100 MG tablet Take 100 mg by mouth daily.  . Multiple Vitamins-Minerals (ICAPS AREDS 2) CAPS Take 2 capsules by mouth daily.  Vladimir Faster Glycol-Propyl Glycol (SYSTANE) 0.4-0.3 % SOLN Place 1 drop into both eyes daily as needed. For irritation  . rosuvastatin (CRESTOR) 10 MG tablet Take 10  mg by mouth daily.  . sildenafil (VIAGRA) 100 MG tablet Take 100 mg by mouth daily as needed.   No facility-administered encounter medications on file as of 12/22/2017.     Allergies as of 12/22/2017 - Review Complete 12/22/2017  Allergen Reaction Noted  . Durezol [difluprednate] Other (See Comments) 10/20/2011  . Advair diskus [fluticasone-salmeterol] Other (See Comments) 11/24/2017    Past Medical History:  Diagnosis Date  . Anxiety   . Blurred vision, left eye   . Depression   . GERD (gastroesophageal reflux disease)   . High cholesterol   . Hypertension   . Hypothyroidism     Past Surgical History:  Procedure Laterality Date  . CATARACT EXTRACTION W/ INTRAOCULAR LENS  IMPLANT, BILATERAL     2002 (left); 2012 (right)  . EYE SURGERY    . [place]   left  . PARS PLANA VITRECTOMY  10/20/11   "took out the old lens implant and put a new one in"  . PARS PLANA VITRECTOMY  10/20/2011   Procedure: PARS PLANA VITRECTOMY WITH 25G REMOVAL/SUTURE INTRAOCULAR LENS;  Surgeon: Hayden Pedro, MD;  Location: [facility];  Service: Ophthalmology;  Laterality: Left;  . RETINAL DETACHMENT SURGERY  2002   left  . RETINAL DETACHMENT SURGERY  2003   "repair; left"    Family History  Problem Relation Age of Onset  . Colon cancer Father     Social History   Socioeconomic History  . Marital status: Married  Spouse name: Not on file  . Number of children: Not on file  . Years of education: Not on file  . Highest education level: Not on file  Occupational History  . Not on file  Social Needs  . Financial resource strain: Not on file  . Food insecurity:    Worry: Not on file    Inability: Not on file  . Transportation needs:    Medical: Not on file    Non-medical: Not on file  Tobacco Use  . Smoking status: Never Smoker  . Smokeless tobacco: Never Used  . Tobacco comment: "tried smoking briefly as a teen"  Substance and Sexual Activity  . Alcohol use: Yes     Comment: 10/20/11 "last alcohol ` 2010"  . Drug use: No  . Sexual activity: Yes  Lifestyle  . Physical activity:    Days per week: Not on file    Minutes per session: Not on file  . Stress: Not on file  Relationships  . Social connections:    Talks on phone: Not on file    Gets together: Not on file    Attends religious service: Not on file    Active member of club or organization: Not on file    Attends meetings of clubs or organizations: Not on file    Relationship status: Not on file  . Intimate partner violence:    Fear of current or ex partner: Not on file    Emotionally abused: Not on file    Physically abused: Not on file    Forced sexual activity: Not on file  Other Topics Concern  . Not on file  Social History Narrative  . Not on file    Review of systems: Review of Systems  Constitutional: Negative for fever and chills.  HENT: Negative.   Eyes: Negative for blurred vision.  Respiratory: as per HPI  Cardiovascular: Negative for chest pain and palpitations.  Gastrointestinal: Negative for vomiting, diarrhea, blood per rectum. Genitourinary: Negative for dysuria, urgency, frequency and hematuria.  Musculoskeletal: Negative for myalgias, back pain and joint pain.  Skin: Negative for itching and rash.  Neurological: Negative for dizziness, tremors, focal weakness, seizures and loss of consciousness.  Endo/Heme/Allergies: Negative for environmental allergies.  Psychiatric/Behavioral: Negative for depression, suicidal ideas and hallucinations.  All other systems reviewed and are negative.  Physical Exam: Blood pressure 138/80, pulse 86, height 6' 3.5" (1.918 m), weight 228 lb (103.4 kg), SpO2 98 %. Gen:      No acute distress HEENT:  EOMI, sclera anicteric Neck:     No masses; no thyromegaly Lungs:    Clear to auscultation bilaterally; normal respiratory effort CV:         Regular rate and rhythm; no murmurs Abd:      + bowel sounds; soft, non-tender; no palpable  masses, no distension Ext:    No edema; adequate peripheral perfusion Skin:      Warm and dry; no rash Neuro: alert and oriented x 3 Psych: normal mood and affect  Data Reviewed: Chest x-ray 11/01/17- no acute lung abnormality, hyperinflated lungs, calcified granulomas in the left lung stable since 2007 I have reviewed the images personally  PFTs 12/22/2017 FVC 5.03 [89%), FEV1 3.52 [83%], F/F 70, TLC 116%, DLCO 86% Mild obstructive airway disease.  Assessment:  COPD PFTs reviewed which showed mild COPD.  He has minimal smoking history but has been exposed to significant secondhand smoke through his life He is asymptomatic and does not need  any inhaler therapy.  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  The granulomas have been stable since at least 2007 on review of his chest x-rays.  Health maintenance 04/14/2017-influenza Give Pneumovax 23 today.  Plan/Recommendations: - Check A1AT levels and phenotype - Pneumovax  Marshell Garfinkel MD [facility] 12/22/2017, 2:56 PM  CC: Sharilyn Sites, MD

## 2017-12-27 LAB — ALPHA-1 ANTITRYPSIN PHENOTYPE: A-1 Antitrypsin, Ser: 133 mg/dL (ref 83–199)

## 2018-01-04 ENCOUNTER — Other Ambulatory Visit: Payer: Self-pay

## 2018-01-04 ENCOUNTER — Ambulatory Visit (AMBULATORY_SURGERY_CENTER): Payer: Self-pay

## 2018-01-04 VITALS — Ht 75.0 in | Wt 231.4 lb

## 2018-01-04 DIAGNOSIS — Z800 Family history of malignant neoplasm of digestive organs: Secondary | ICD-10-CM

## 2018-01-04 MED ORDER — NA SULFATE-K SULFATE-MG SULF 17.5-3.13-1.6 GM/177ML PO SOLN: 1.0000 | Freq: Once | ORAL | 0 refills | Status: AC

## 2018-01-04 NOTE — Progress Notes (Signed)
No egg or soy allergy known to patient  No issues with past sedation with any surgeries  or procedures, no intubation problems  No diet pills per patient No home 02 use per patient  No blood thinners per patient  Pt denies issues with constipation  No A fib or A flutter  EMMI video sent to pt's e mail , declined  

## 2018-01-15 ENCOUNTER — Encounter: Payer: Self-pay | Admitting: Internal Medicine

## 2018-01-18 ENCOUNTER — Ambulatory Visit (AMBULATORY_SURGERY_CENTER): Payer: BLUE CROSS/BLUE SHIELD | Admitting: Internal Medicine

## 2018-01-18 ENCOUNTER — Other Ambulatory Visit: Payer: Self-pay

## 2018-01-18 ENCOUNTER — Encounter: Payer: Self-pay | Admitting: Internal Medicine

## 2018-01-18 VITALS — BP 119/83 | HR 66 | Temp 98.4°F | Resp 17 | Ht 75.0 in | Wt 228.0 lb

## 2018-01-18 DIAGNOSIS — D120 Benign neoplasm of cecum: Secondary | ICD-10-CM | POA: Diagnosis not present

## 2018-01-18 DIAGNOSIS — Z1211 Encounter for screening for malignant neoplasm of colon: Secondary | ICD-10-CM | POA: Diagnosis not present

## 2018-01-18 DIAGNOSIS — D123 Benign neoplasm of transverse colon: Secondary | ICD-10-CM

## 2018-01-18 DIAGNOSIS — Z800 Family history of malignant neoplasm of digestive organs: Secondary | ICD-10-CM | POA: Diagnosis not present

## 2018-01-18 MED ORDER — SODIUM CHLORIDE 0.9 % IV SOLN: 500.0000 mL | Freq: Once | INTRAVENOUS | Status: DC

## 2018-01-18 NOTE — Op Note (Signed)
[facility] Patient Name: Jeffrey Gordon Procedure Date: 01/18/2018 11:25 AM MRN: [id number] Endoscopist: Docia Chuck. Henrene Pastor , MD Age: 62 Referring MD:  Date of Birth: [birth year] Gender: Male Account #: [id number] Procedure:                Colonoscopy, With cold snare polypectomy x 2 Indications:              Screening in patient at increased risk: Colorectal                            cancer in father before age 67. Previous                            examination 2003, 2008, 2014 Medicines:                Monitored Anesthesia Care Procedure:                Pre-Anesthesia Assessment:                           - Prior to the procedure, a History and Physical                            was performed, and patient medications and                            allergies were reviewed. The patient's tolerance of                            previous anesthesia was also reviewed. The risks                            and benefits of the procedure and the sedation                            options and risks were discussed with the patient.                            All questions were answered, and informed consent                            was obtained. Prior Anticoagulants: The patient has                            taken no previous anticoagulant or antiplatelet                            agents. ASA Grade Assessment: II - A patient with                            mild systemic disease. After reviewing the risks                            and benefits, the patient was deemed in  satisfactory condition to undergo the procedure.                           After obtaining informed consent, the colonoscope                            was passed under direct vision. Throughout the                            procedure, the patient's blood pressure, pulse, and                            oxygen saturations were monitored continuously. The                            Model  CF-HQ190L [id number]) scope was introduced                            through the anus and advanced to the the cecum,                            identified by appendiceal orifice and ileocecal                            valve. The ileocecal valve, appendiceal orifice,                            and rectum were photographed. The quality of the                            bowel preparation was excellent. The colonoscopy                            was performed without difficulty. The patient                            tolerated the procedure well. The bowel preparation                            used was SUPREP. Scope In: 11:35:05 AM Scope Out: 11:53:10 AM Scope Withdrawal Time: 0 hours 11 minutes 39 seconds  Total Procedure Duration: 0 hours 18 minutes 5 seconds  Findings:                 Two polyps were found in the transverse colon and                            cecum. The polyps were 1 to 3 mm in size. These                            polyps were removed with a cold snare. Resection                            and retrieval were complete.  Multiple diverticula were found in the sigmoid                            colon.                           The exam was otherwise without abnormality on                            direct and retroflexion views. Complications:            No immediate complications. Estimated blood loss:                            None. Estimated Blood Loss:     Estimated blood loss: none. Impression:               - Two 1 to 3 mm polyps in the transverse colon and                            in the cecum, removed with a cold snare. Resected                            and retrieved.                           - Diverticulosis in the sigmoid colon.                           - The examination was otherwise normal on direct                            and retroflexion views. Recommendation:           - Repeat colonoscopy in 5 years for  surveillance.                           - Patient has a contact number available for                            emergencies. The signs and symptoms of potential                            delayed complications were discussed with the                            patient. Return to normal activities tomorrow.                            Written discharge instructions were provided to the                            patient.                           - Resume previous diet.                           -  Continue present medications.                           - Await pathology results. Docia Chuck. Henrene Pastor, MD 01/18/2018 11:58:22 AM This report has been signed electronically.

## 2018-01-18 NOTE — Progress Notes (Signed)
Called to room to assist during endoscopic procedure.  Patient ID and intended procedure confirmed with present staff. Received instructions for my participation in the procedure from the performing physician.  

## 2018-01-18 NOTE — Patient Instructions (Addendum)
**   Handouts given on polyps and diverticulosis **   YOU HAD AN ENDOSCOPIC PROCEDURE TODAY AT THE [facility]:   Refer to the procedure report that was given to you for any specific questions about what was found during the examination.  If the procedure report does not answer your questions, please call your gastroenterologist to clarify.  If you requested that your care partner not be given the details of your procedure findings, then the procedure report has been included in a sealed envelope for you to review at your convenience later.  YOU SHOULD EXPECT: Some feelings of bloating in the abdomen. Passage of more gas than usual.  Walking can help get rid of the air that was put into your GI tract during the procedure and reduce the bloating. If you had a lower endoscopy (such as a colonoscopy or flexible sigmoidoscopy) you may notice spotting of blood in your stool or on the toilet paper. If you underwent a bowel prep for your procedure, you may not have a normal bowel movement for a few days.  Please Note:  You might notice some irritation and congestion in your nose or some drainage.  This is from the oxygen used during your procedure.  There is no need for concern and it should clear up in a day or so.  SYMPTOMS TO REPORT IMMEDIATELY:   Following lower endoscopy (colonoscopy or flexible sigmoidoscopy):  Excessive amounts of blood in the stool  Significant tenderness or worsening of abdominal pains  Swelling of the abdomen that is new, acute  Fever of 100F or higher  For urgent or emergent issues, a gastroenterologist can be reached at any hour by calling [phone].   DIET:  We do recommend a small meal at first, but then you may proceed to your regular diet.  Drink plenty of fluids but you should avoid alcoholic beverages for 24 hours.  ACTIVITY:  You should plan to take it easy for the rest of today and you should NOT DRIVE or use heavy machinery until tomorrow (because  of the sedation medicines used during the test).    FOLLOW UP: Our staff will call the number listed on your records the next business day following your procedure to check on you and address any questions or concerns that you may have regarding the information given to you following your procedure. If we do not reach you, we will leave a message.  However, if you are feeling well and you are not experiencing any problems, there is no need to return our call.  We will assume that you have returned to your regular daily activities without incident.  If any biopsies were taken you will be contacted by phone or by letter within the next 1-3 weeks.  Please call us at [phone] if you have not heard about the biopsies in 3 weeks.    SIGNATURES/CONFIDENTIALITY: You and/or your care partner have signed paperwork which will be entered into your electronic medical record.  These signatures attest to the fact that that the information above on your After Visit Summary has been reviewed and is understood.  Full responsibility of the confidentiality of this discharge information lies with you and/or your care-partner. 

## 2018-01-18 NOTE — Progress Notes (Signed)
Report to PACU, RN, vss, BBS= Clear.  

## 2018-01-19 ENCOUNTER — Telehealth: Payer: Self-pay

## 2018-01-19 NOTE — Telephone Encounter (Signed)
  Follow up Call-  Call back number 01/18/2018  Post procedure Call Back phone  # [phone]  Permission to leave phone message Yes  Some recent data might be hidden     Patient questions:  Do you have a fever, pain , or abdominal swelling? No. Pain Score  0 *  Have you tolerated food without any problems? Yes.    Have you been able to return to your normal activities? Yes.    Do you have any questions about your discharge instructions: Diet   No. Medications  No. Follow up visit  No.  Do you have questions or concerns about your Care? No.  Actions: * If pain score is 4 or above: No action needed, pain <4.

## 2018-01-23 ENCOUNTER — Encounter: Payer: Self-pay | Admitting: Internal Medicine

## 2018-03-06 DIAGNOSIS — E782 Mixed hyperlipidemia: Secondary | ICD-10-CM | POA: Diagnosis not present

## 2018-03-06 DIAGNOSIS — Z0001 Encounter for general adult medical examination with abnormal findings: Secondary | ICD-10-CM | POA: Diagnosis not present

## 2018-03-06 DIAGNOSIS — R7309 Other abnormal glucose: Secondary | ICD-10-CM | POA: Diagnosis not present

## 2018-03-06 DIAGNOSIS — Z1389 Encounter for screening for other disorder: Secondary | ICD-10-CM | POA: Diagnosis not present

## 2018-03-06 DIAGNOSIS — Z6829 Body mass index (BMI) 29.0-29.9, adult: Secondary | ICD-10-CM | POA: Diagnosis not present

## 2018-03-06 DIAGNOSIS — E039 Hypothyroidism, unspecified: Secondary | ICD-10-CM | POA: Diagnosis not present

## 2018-03-06 DIAGNOSIS — N419 Inflammatory disease of prostate, unspecified: Secondary | ICD-10-CM | POA: Diagnosis not present

## 2018-03-06 DIAGNOSIS — I10 Essential (primary) hypertension: Secondary | ICD-10-CM | POA: Diagnosis not present

## 2018-05-03 DIAGNOSIS — Z23 Encounter for immunization: Secondary | ICD-10-CM | POA: Diagnosis not present

## 2018-06-29 ENCOUNTER — Encounter (INDEPENDENT_AMBULATORY_CARE_PROVIDER_SITE_OTHER): Payer: BLUE CROSS/BLUE SHIELD | Admitting: Ophthalmology

## 2018-07-31 DIAGNOSIS — Z1389 Encounter for screening for other disorder: Secondary | ICD-10-CM | POA: Diagnosis not present

## 2018-07-31 DIAGNOSIS — J329 Chronic sinusitis, unspecified: Secondary | ICD-10-CM | POA: Diagnosis not present

## 2018-07-31 DIAGNOSIS — Z6829 Body mass index (BMI) 29.0-29.9, adult: Secondary | ICD-10-CM | POA: Diagnosis not present

## 2018-07-31 DIAGNOSIS — E063 Autoimmune thyroiditis: Secondary | ICD-10-CM | POA: Diagnosis not present

## 2018-07-31 DIAGNOSIS — I10 Essential (primary) hypertension: Secondary | ICD-10-CM | POA: Diagnosis not present

## 2018-07-31 DIAGNOSIS — E663 Overweight: Secondary | ICD-10-CM | POA: Diagnosis not present

## 2018-08-28 DIAGNOSIS — H31012 Macula scars of posterior pole (postinflammatory) (post-traumatic), left eye: Secondary | ICD-10-CM | POA: Diagnosis not present

## 2018-08-30 DIAGNOSIS — H20012 Primary iridocyclitis, left eye: Secondary | ICD-10-CM | POA: Diagnosis not present

## 2018-09-04 DIAGNOSIS — I10 Essential (primary) hypertension: Secondary | ICD-10-CM | POA: Diagnosis not present

## 2018-09-04 DIAGNOSIS — K219 Gastro-esophageal reflux disease without esophagitis: Secondary | ICD-10-CM | POA: Diagnosis not present

## 2018-09-04 DIAGNOSIS — E782 Mixed hyperlipidemia: Secondary | ICD-10-CM | POA: Diagnosis not present

## 2018-09-04 DIAGNOSIS — E039 Hypothyroidism, unspecified: Secondary | ICD-10-CM | POA: Diagnosis not present

## 2018-09-24 DIAGNOSIS — N400 Enlarged prostate without lower urinary tract symptoms: Secondary | ICD-10-CM | POA: Diagnosis not present

## 2018-09-24 DIAGNOSIS — K219 Gastro-esophageal reflux disease without esophagitis: Secondary | ICD-10-CM | POA: Diagnosis not present

## 2018-09-24 DIAGNOSIS — I10 Essential (primary) hypertension: Secondary | ICD-10-CM | POA: Diagnosis not present

## 2018-09-24 DIAGNOSIS — E785 Hyperlipidemia, unspecified: Secondary | ICD-10-CM | POA: Diagnosis not present

## 2018-09-24 DIAGNOSIS — E039 Hypothyroidism, unspecified: Secondary | ICD-10-CM | POA: Diagnosis not present

## 2018-09-26 DIAGNOSIS — J449 Chronic obstructive pulmonary disease, unspecified: Secondary | ICD-10-CM | POA: Diagnosis not present

## 2018-09-26 DIAGNOSIS — Z0000 Encounter for general adult medical examination without abnormal findings: Secondary | ICD-10-CM | POA: Diagnosis not present

## 2018-09-26 DIAGNOSIS — I10 Essential (primary) hypertension: Secondary | ICD-10-CM | POA: Diagnosis not present

## 2018-09-26 DIAGNOSIS — E782 Mixed hyperlipidemia: Secondary | ICD-10-CM | POA: Diagnosis not present

## 2018-10-03 DIAGNOSIS — J019 Acute sinusitis, unspecified: Secondary | ICD-10-CM | POA: Diagnosis not present

## 2018-10-05 ENCOUNTER — Ambulatory Visit: Payer: BLUE CROSS/BLUE SHIELD | Admitting: Pulmonary Disease

## 2018-10-09 ENCOUNTER — Other Ambulatory Visit: Payer: Self-pay | Admitting: *Deleted

## 2018-10-09 ENCOUNTER — Other Ambulatory Visit: Payer: Self-pay | Admitting: Pulmonary Disease

## 2018-10-09 DIAGNOSIS — R0600 Dyspnea, unspecified: Secondary | ICD-10-CM

## 2018-10-10 ENCOUNTER — Ambulatory Visit (INDEPENDENT_AMBULATORY_CARE_PROVIDER_SITE_OTHER): Payer: BLUE CROSS/BLUE SHIELD | Admitting: Pulmonary Disease

## 2018-10-10 ENCOUNTER — Encounter: Payer: Self-pay | Admitting: Pulmonary Disease

## 2018-10-10 ENCOUNTER — Ambulatory Visit: Payer: BLUE CROSS/BLUE SHIELD

## 2018-10-10 VITALS — BP 132/78 | HR 80 | Ht 75.0 in | Wt 232.0 lb

## 2018-10-10 DIAGNOSIS — J439 Emphysema, unspecified: Secondary | ICD-10-CM | POA: Diagnosis not present

## 2018-10-10 NOTE — Progress Notes (Addendum)
Jeffrey Gordon    [id number]    Dec 18, 1955  Primary Care Physician:Hall, Edwinna Areola, MD  Referring Physician: Sharilyn Sites, MD [address] [place], [place] [address]  Chief complaint: Follow-up for COPD  HPI: 63 year old with past medical history of GERD, hypothyroidism, hyperlipidemia He had a couple of episodes of pneumonia.  Evaluated in urgent care and given antibiotics and prednisone.  He had a chest x-ray that showed hyperinflation and was told he has COPD.  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  No other significant lung issues.  No family history of lung problems except for pulmonary embolism in his grandfather after immobilization secondary to surgery.  Pets: Cat, no birds, farm animals  Occupation: Retired Optometrist Exposures: No known exposures, no mold, hot tub Smoking history: Smoked for a year high school Travel History: Lived in [place], [place], [place], [place] and [place]  Interim history: States that breathing is doing okay with no issues Currently on Augmentin therapy for sinusitis and nasal congestion.  Outpatient Encounter Medications as of 10/10/2018  Medication Sig  . aspirin EC 81 MG tablet Take 81 mg by mouth daily.  . CO ENZYME Q-10 PO Take 1 tablet by mouth daily.  Marland Kitchen doxazosin (CARDURA) 8 MG tablet Take 4 mg by mouth at bedtime.  Marland Kitchen FLUoxetine (PROZAC) 40 MG capsule Take 40 mg by mouth daily.  Marland Kitchen levothyroxine (SYNTHROID, LEVOTHROID) 88 MCG tablet Take 88 mcg by mouth daily.  Marland Kitchen losartan (COZAAR) 100 MG tablet Take 100 mg by mouth daily.  . Multiple Vitamins-Minerals (ICAPS AREDS 2) CAPS Take 2 capsules by mouth daily.  . rosuvastatin (CRESTOR) 10 MG tablet Take 10 mg by mouth daily.  . sildenafil (VIAGRA) 100 MG tablet Take 100 mg by mouth daily as needed.  Marland Kitchen PROAIR HFA 108 (90 Base) MCG/ACT inhaler INHALE 1 TO 2 PUFFS BY MOUTH EVERY 4 TO 6 HOURS PRF SHORTNESS OF  BREATH/WHEEZING   Facility-Administered Encounter Medications as of 10/10/2018  Medication  . 0.9 %  sodium chloride infusion   Physical Exam: Blood pressure 138/80, pulse 86, height 6' 3.5" (1.918 m), weight 228 lb (103.4 kg), SpO2 98 %. Gen:      No acute distress HEENT:  EOMI, sclera anicteric Neck:     No masses; no thyromegaly Lungs:    Clear to auscultation bilaterally; normal respiratory effort CV:         Regular rate and rhythm; no murmurs Abd:      + bowel sounds; soft, non-tender; no palpable masses, no distension Ext:    No edema; adequate peripheral perfusion Skin:      Warm and dry; no rash Neuro: alert and oriented x 3 Psych: normal mood and affect  Data Reviewed: Imaging Chest x-ray 11/01/17- no acute lung abnormality, hyperinflated lungs, calcified granulomas in the left lung stable since 2007 I have reviewed the images personally  PFTs 12/22/2017 FVC 5.03 [89%), FEV1 3.52 [83%], F/F 70, TLC 116%, DLCO 86% Mild obstructive airway disease.  Labs Alpha-1 antitrypsin 12/22/2017-133, PI MM CBC 11/14/2008-WBC 7.1, eos 4%, absolute eosinophil count 284  Assessment:  COPD PFTs today reviewed with moderate COPD.  He has minimal smoking history but has been exposed to significant secondhand smoke through his life He is asymptomatic and does not need any maintenance inhaler therapy.  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  The granulomas have been stable since at least 2007 on review of his chest x-rays.  Health maintenance 05/03/2018-influenza 12/22/2017-Pneumovax . Plan/Recommendations: - Continue albuterol as needed.  Follow-up in 6 months  Marshell Garfinkel MD [facility] 10/10/2018, 11:15 AM  CC: Sharilyn Sites, MD

## 2018-10-10 NOTE — Patient Instructions (Signed)
I am glad that the breathing is doing well We will continue to follow your symptoms Follow-up in 6 months.

## 2018-10-31 LAB — PULMONARY FUNCTION TEST
FEF 25-75 Pre: 1.53 L/sec
FEF2575-%Pred-Pre: 46 %
FEV1-%PRED-PRE: 68 %
FEV1-Pre: 2.87 L
FEV1FVC-%PRED-PRE: 87 %
FEV6-%PRED-PRE: 80 %
FEV6-PRE: 4.26 L
FEV6FVC-%Pred-Pre: 102 %
FVC-%Pred-Pre: 78 %
FVC-Pre: 4.35 L
Pre FEV1/FVC ratio: 66 %
Pre FEV6/FVC Ratio: 98 %

## 2018-11-07 ENCOUNTER — Telehealth: Payer: Self-pay | Admitting: Pulmonary Disease

## 2018-11-07 MED ORDER — BUDESONIDE-FORMOTEROL FUMARATE 160-4.5 MCG/ACT IN AERO: 2.0000 | INHALATION_SPRAY | Freq: Two times a day (BID) | RESPIRATORY_TRACT | 5 refills | Status: DC

## 2018-11-07 NOTE — Telephone Encounter (Signed)
Spoke with pt. Advised him of Dr. Matilde Bash recommendations. Also advised him of how much each inhaler is used per day. Pt has requested to try Symbicort. Rx has been sent in. Nothing further was needed.

## 2018-11-07 NOTE — Telephone Encounter (Signed)
Spoke with pt. States that he would like to start a daily inhaler. Reports at his last OV, Dr. Vaughan Browner offered to start him on a daily inhaler but he declined. Pt has changed his mind. Does not report having any acute breathing symptoms at this time.  Dr. Vaughan Browner - please advise. Thanks!

## 2018-11-07 NOTE — Telephone Encounter (Signed)
Order symbiciort 160 or breo 200 whichever is covered by insurance

## 2019-03-21 DIAGNOSIS — I10 Essential (primary) hypertension: Secondary | ICD-10-CM | POA: Diagnosis not present

## 2019-03-21 DIAGNOSIS — E782 Mixed hyperlipidemia: Secondary | ICD-10-CM | POA: Diagnosis not present

## 2019-03-21 DIAGNOSIS — E039 Hypothyroidism, unspecified: Secondary | ICD-10-CM | POA: Diagnosis not present

## 2019-03-27 DIAGNOSIS — E782 Mixed hyperlipidemia: Secondary | ICD-10-CM | POA: Diagnosis not present

## 2019-03-27 DIAGNOSIS — K219 Gastro-esophageal reflux disease without esophagitis: Secondary | ICD-10-CM | POA: Diagnosis not present

## 2019-03-27 DIAGNOSIS — I10 Essential (primary) hypertension: Secondary | ICD-10-CM | POA: Diagnosis not present

## 2019-03-27 DIAGNOSIS — J449 Chronic obstructive pulmonary disease, unspecified: Secondary | ICD-10-CM | POA: Diagnosis not present

## 2019-04-03 ENCOUNTER — Ambulatory Visit: Payer: BLUE CROSS/BLUE SHIELD | Admitting: Pulmonary Disease

## 2019-04-04 ENCOUNTER — Ambulatory Visit (INDEPENDENT_AMBULATORY_CARE_PROVIDER_SITE_OTHER): Payer: BLUE CROSS/BLUE SHIELD | Admitting: Pulmonary Disease

## 2019-04-04 ENCOUNTER — Encounter: Payer: Self-pay | Admitting: Pulmonary Disease

## 2019-04-04 ENCOUNTER — Other Ambulatory Visit: Payer: Self-pay

## 2019-04-04 VITALS — BP 128/66 | HR 77 | Temp 97.3°F | Ht 75.0 in | Wt 236.8 lb

## 2019-04-04 DIAGNOSIS — J439 Emphysema, unspecified: Secondary | ICD-10-CM

## 2019-04-04 LAB — CBC WITH DIFFERENTIAL/PLATELET
Basophils Absolute: 0.1 10*3/uL (ref 0.0–0.1)
Basophils Relative: 1.1 % (ref 0.0–3.0)
Eosinophils Absolute: 0.1 10*3/uL (ref 0.0–0.7)
Eosinophils Relative: 1.7 % (ref 0.0–5.0)
HCT: 42 % (ref 39.0–52.0)
Hemoglobin: 14.2 g/dL (ref 13.0–17.0)
Lymphocytes Relative: 20.1 % (ref 12.0–46.0)
Lymphs Abs: 1.7 10*3/uL (ref 0.7–4.0)
MCHC: 33.8 g/dL (ref 30.0–36.0)
MCV: 88.5 fl (ref 78.0–100.0)
Monocytes Absolute: 0.9 10*3/uL (ref 0.1–1.0)
Monocytes Relative: 10.5 % (ref 3.0–12.0)
Neutro Abs: 5.7 10*3/uL (ref 1.4–7.7)
Neutrophils Relative %: 66.6 % (ref 43.0–77.0)
Platelets: 263 10*3/uL (ref 150.0–400.0)
RBC: 4.74 Mil/uL (ref 4.22–5.81)
RDW: 14.4 % (ref 11.5–15.5)
WBC: 8.6 10*3/uL (ref 4.0–10.5)

## 2019-04-04 MED ORDER — BUDESONIDE-FORMOTEROL FUMARATE 160-4.5 MCG/ACT IN AERO: 2.0000 | INHALATION_SPRAY | Freq: Two times a day (BID) | RESPIRATORY_TRACT | 5 refills | Status: DC

## 2019-04-04 NOTE — Patient Instructions (Addendum)
Glad you are doing well with your breathing We will check some labs today including CBC differential, IgE, alpha-1 antitrypsin levels and phenotype Follow-up in 6 months.

## 2019-04-04 NOTE — Addendum Note (Signed)
Addended by: Suzzanne Cloud E on: 04/04/2019 10:29 AM   Modules accepted: Orders

## 2019-04-04 NOTE — Progress Notes (Signed)
Jeffrey Gordon    [id number]    April 07, 1956  Primary Care Physician:Hall, Edwinna Areola, MD  Referring Physician: Celene Squibb, MD [place],  [place] [address]  Chief complaint: Follow-up for COPD, asthma  HPI: 63 year old with past medical history of GERD, hypothyroidism, hyperlipidemia He had a couple of episodes of pneumonia.  Evaluated in urgent care and given antibiotics and prednisone.  He had a chest x-ray that showed hyperinflation and was told he has COPD.  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  No other significant lung issues.  No family history of lung problems except for pulmonary embolism in his grandfather after immobilization secondary to surgery.  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  States that this is improved his breathing tremendously Hardly needs to use his rescue inhaler.  Outpatient Encounter Medications as of 04/04/2019  Medication Sig  . aspirin EC 81 MG tablet Take 81 mg by mouth daily.  . budesonide-formoterol (SYMBICORT) 160-4.5 MCG/ACT inhaler Inhale 2 puffs into the lungs 2 (two) times daily.  . CO ENZYME Q-10 PO Take 1 tablet by mouth daily.  Marland Kitchen doxazosin (CARDURA) 8 MG tablet Take 4 mg by mouth at bedtime.  Marland Kitchen FLUoxetine (PROZAC) 40 MG capsule Take 40 mg by mouth daily.  Marland Kitchen levothyroxine (SYNTHROID, LEVOTHROID) 88 MCG tablet Take 88 mcg by mouth daily.  Marland Kitchen losartan (COZAAR) 100 MG tablet Take 100 mg by mouth daily.  . Multiple Vitamins-Minerals (ICAPS AREDS 2) CAPS Take 2 capsules by mouth daily.  Marland Kitchen omeprazole (PRILOSEC) 20 MG capsule Take 20 mg by mouth daily.  Marland Kitchen PROAIR HFA 108 (90 Base) MCG/ACT inhaler INHALE 1 TO 2  PUFFS BY MOUTH EVERY 4 TO 6 HOURS PRF SHORTNESS OF BREATH/WHEEZING  . rosuvastatin (CRESTOR) 10 MG tablet Take 10 mg by mouth daily.  . sildenafil (VIAGRA) 100 MG tablet Take 100 mg by mouth daily as needed.   Facility-Administered Encounter Medications as of 04/04/2019  Medication  . 0.9 %  sodium chloride infusion   Physical Exam: Blood pressure 128/66, pulse 77, temperature (!) 97.3 F (36.3 C), height 6\' 3"  (1.905 m), weight 236 lb 12.8 oz (107.4 kg), SpO2 98 %. Gen:      No acute distress HEENT:  EOMI, sclera anicteric Neck:     No masses; no thyromegaly Lungs:    Clear to auscultation bilaterally; normal respiratory effort CV:         Regular rate and rhythm; no murmurs Abd:      + bowel sounds; soft, non-tender; no palpable masses, no distension Ext:    No edema; adequate peripheral perfusion Skin:      Warm and dry; no rash Neuro: alert and oriented x 3 Psych: normal mood and affect  Data Reviewed: Imaging Chest x-ray 11/01/17- no acute lung abnormality, hyperinflated lungs, calcified granulomas in the left lung stable since 2007 I have reviewed the images personally  PFTs 12/22/2017 FVC 5.03 [89%), FEV1 3.52 [83%], F/F 70, TLC 116%, DLCO 86% Mild obstructive airway disease.  Labs Alpha-1 antitrypsin 12/22/2017-133, PI MM CBC 11/14/2008-WBC 7.1, eos 4%, absolute eosinophil count 284  Assessment:  COPD,  asthma PFTs today reviewed with moderate obstruction.  He has minimal smoking history but has been exposed to significant secondhand smoke through his life  Symptoms improved with Symbicort.  Continue current therapy Check CBC differential, IgE and alpha-1 antitrypsin levels for baseline assessment.  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  The granulomas have been stable since at least 2007 on review of his chest x-rays.  Health maintenance 05/03/2018-influenza 12/22/2017-Pneumovax . Plan/Recommendations: - Continue Symbicort,  albuterol as needed. - CBC differential, IgE, alpha-1 antitrypsin  Follow-up in 6 months  Marshell Garfinkel MD [facility] 04/04/2019, 10:10 AM  CC: Celene Squibb, MD

## 2019-04-11 LAB — ALPHA-1 ANTITRYPSIN PHENOTYPE: A-1 Antitrypsin, Ser: 131 mg/dL (ref 83–199)

## 2019-04-11 LAB — IGE: IgE (Immunoglobulin E), Serum: 19 kU/L (ref <=114)

## 2019-04-20 ENCOUNTER — Other Ambulatory Visit: Payer: Self-pay | Admitting: Pulmonary Disease

## 2019-07-04 DIAGNOSIS — E871 Hypo-osmolality and hyponatremia: Secondary | ICD-10-CM | POA: Diagnosis not present

## 2019-07-04 DIAGNOSIS — Z6831 Body mass index (BMI) 31.0-31.9, adult: Secondary | ICD-10-CM | POA: Diagnosis not present

## 2019-07-04 DIAGNOSIS — E669 Obesity, unspecified: Secondary | ICD-10-CM | POA: Diagnosis not present

## 2019-08-16 HISTORY — PX: INGUINAL HERNIA REPAIR: SUR1180

## 2019-10-02 DIAGNOSIS — E782 Mixed hyperlipidemia: Secondary | ICD-10-CM | POA: Diagnosis not present

## 2019-10-02 DIAGNOSIS — Z6831 Body mass index (BMI) 31.0-31.9, adult: Secondary | ICD-10-CM | POA: Diagnosis not present

## 2019-10-02 DIAGNOSIS — E669 Obesity, unspecified: Secondary | ICD-10-CM | POA: Diagnosis not present

## 2019-10-02 DIAGNOSIS — I10 Essential (primary) hypertension: Secondary | ICD-10-CM | POA: Diagnosis not present

## 2019-10-02 DIAGNOSIS — E871 Hypo-osmolality and hyponatremia: Secondary | ICD-10-CM | POA: Diagnosis not present

## 2019-10-02 DIAGNOSIS — E039 Hypothyroidism, unspecified: Secondary | ICD-10-CM | POA: Diagnosis not present

## 2019-10-02 DIAGNOSIS — N400 Enlarged prostate without lower urinary tract symptoms: Secondary | ICD-10-CM | POA: Diagnosis not present

## 2019-10-03 DIAGNOSIS — Z03818 Encounter for observation for suspected exposure to other biological agents ruled out: Secondary | ICD-10-CM | POA: Diagnosis not present

## 2019-10-03 DIAGNOSIS — Z20828 Contact with and (suspected) exposure to other viral communicable diseases: Secondary | ICD-10-CM | POA: Diagnosis not present

## 2019-10-08 DIAGNOSIS — K219 Gastro-esophageal reflux disease without esophagitis: Secondary | ICD-10-CM | POA: Diagnosis not present

## 2019-10-08 DIAGNOSIS — J449 Chronic obstructive pulmonary disease, unspecified: Secondary | ICD-10-CM | POA: Diagnosis not present

## 2019-10-08 DIAGNOSIS — I10 Essential (primary) hypertension: Secondary | ICD-10-CM | POA: Diagnosis not present

## 2019-10-08 DIAGNOSIS — E782 Mixed hyperlipidemia: Secondary | ICD-10-CM | POA: Diagnosis not present

## 2019-10-09 ENCOUNTER — Other Ambulatory Visit: Payer: Self-pay

## 2019-10-09 ENCOUNTER — Ambulatory Visit: Payer: BC Managed Care – PPO | Admitting: Pulmonary Disease

## 2019-10-09 ENCOUNTER — Encounter: Payer: Self-pay | Admitting: Pulmonary Disease

## 2019-10-09 ENCOUNTER — Ambulatory Visit (INDEPENDENT_AMBULATORY_CARE_PROVIDER_SITE_OTHER): Payer: BC Managed Care – PPO

## 2019-10-09 VITALS — BP 120/76 | HR 76 | Temp 98.0°F | Ht 75.5 in | Wt 242.0 lb

## 2019-10-09 DIAGNOSIS — R0600 Dyspnea, unspecified: Secondary | ICD-10-CM | POA: Diagnosis not present

## 2019-10-09 DIAGNOSIS — R918 Other nonspecific abnormal finding of lung field: Secondary | ICD-10-CM | POA: Diagnosis not present

## 2019-10-09 DIAGNOSIS — J4540 Moderate persistent asthma, uncomplicated: Secondary | ICD-10-CM | POA: Diagnosis not present

## 2019-10-09 NOTE — Addendum Note (Signed)
Addended by: Hildred Alamin I on: 10/09/2019 11:41 AM   Modules accepted: Orders

## 2019-10-09 NOTE — Progress Notes (Signed)
Jeffrey Gordon    [id number]    1956/06/21  Primary Care Physician:Hall, Edwinna Areola, MD  Referring Physician: Celene Squibb, MD [address] [place],  [place] [address]  Chief complaint: Follow-up for Asthma  HPI: 64 year old with past medical history of GERD, hypothyroidism, hyperlipidemia Evaluated in 2019 for dyspnea, hyperinflation and obstructive airway disease secondary to asthma, possible COPD. Started on Symbicort with improvement in symptoms  At the age of 6 he had a fungus infection of his lung.  No other significant lung issues.  No family history of lung problems except for pulmonary embolism in his grandfather after immobilization secondary to surgery.  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  Feels that it is made a real difference in his breathing Hardly needs to use his rescue inhaler.  Outpatient Encounter Medications as of 10/09/2019  Medication Sig  . aspirin EC 81 MG tablet Take 81 mg by mouth daily.  . CO ENZYME Q-10 PO Take 1 tablet by mouth daily.  Marland Kitchen doxazosin (CARDURA) 8 MG tablet Take 4 mg by mouth at bedtime.  Marland Kitchen FLUoxetine (PROZAC) 40 MG capsule Take 40 mg by mouth daily.  Marland Kitchen levothyroxine (SYNTHROID, LEVOTHROID) 88 MCG tablet Take 88 mcg by mouth daily.  Marland Kitchen losartan (COZAAR) 100 MG tablet Take 100 mg by mouth daily.  . Multiple Vitamins-Minerals (ICAPS AREDS 2) CAPS Take 2 capsules by mouth daily.  Marland Kitchen omeprazole (PRILOSEC) 20 MG capsule Take 20 mg by mouth daily.  Marland Kitchen PROAIR HFA 108 (90 Base) MCG/ACT inhaler INHALE 1 TO 2 PUFFS BY MOUTH EVERY 4 TO 6 HOURS PRF SHORTNESS OF BREATH/WHEEZING  . rosuvastatin (CRESTOR) 10 MG tablet Take 10 mg by mouth daily.  . sildenafil (VIAGRA) 100 MG tablet Take 100 mg by  mouth daily as needed.  . SYMBICORT 160-4.5 MCG/ACT inhaler INHALE 2 PUFFS INTO THE LUNGS TWICE DAILY   Facility-Administered Encounter Medications as of 10/09/2019  Medication  . 0.9 %  sodium chloride infusion   Physical Exam: Blood pressure 120/76, pulse 76, temperature 98 F (36.7 C), temperature source Temporal, height 6' 3.5" (1.918 m), weight 242 lb (109.8 kg), SpO2 99 %. Gen:      No acute distress HEENT:  EOMI, sclera anicteric Neck:     No masses; no thyromegaly Lungs:    Clear to auscultation bilaterally; normal respiratory effort CV:         Regular rate and rhythm; no murmurs Abd:      + bowel sounds; soft, non-tender; no palpable masses, no distension Ext:    No edema; adequate peripheral perfusion Skin:      Warm and dry; no rash Neuro: alert and oriented x 3 Psych: normal mood and affect  Data Reviewed: Imaging Chest x-ray 11/01/17- no acute lung abnormality, hyperinflated lungs, calcified granulomas in the left lung stable since 2007 I have reviewed the images personally  PFTs 12/22/2017 FVC 5.03 [89%), FEV1 3.52 [83%], F/F 70, TLC 116%, DLCO 86% Mild obstructive airway disease.  Labs Alpha-1 antitrypsin 04/04/2019-131, PI MM  CBC 04/04/2019-WBC 8.6, eos 1.7%, absolute eosinophil count 146 IgE 04/04/2019-19  Assessment:  Asthma PFTs with moderate obstruction.  Although he has a diagnosis of COPD in the chart I suspect asthma versus main  issue as he has minimal smoking history.  He is doing well on Symbicort.  Continue current therapy We will keep inhaled corticosteroids as he had peripheral eosinophils in the past.  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  The granulomas have been stable since at least 2007 on review of his chest x-rays.  Repeat chest x-ray today  Health maintenance 12/22/2017-Pneumovax . Plan/Recommendations: - Continue Symbicort, albuterol as needed. - X-ray today  Follow-up in 1  year  Marshell Garfinkel MD [facility] 10/09/2019, 11:21 AM  CC: Celene Squibb, MD

## 2019-10-09 NOTE — Patient Instructions (Signed)
Continue the Symbicort We will get a chest x-ray today for routine monitoring Follow-up in 1 year. Please give a call sooner if there is any change in his symptoms.

## 2019-11-11 DIAGNOSIS — H20011 Primary iridocyclitis, right eye: Secondary | ICD-10-CM | POA: Diagnosis not present

## 2019-11-14 DIAGNOSIS — H20011 Primary iridocyclitis, right eye: Secondary | ICD-10-CM | POA: Diagnosis not present

## 2019-11-20 ENCOUNTER — Other Ambulatory Visit (HOSPITAL_COMMUNITY): Payer: Self-pay | Admitting: Ophthalmology

## 2019-11-20 ENCOUNTER — Other Ambulatory Visit: Payer: Self-pay

## 2019-11-20 ENCOUNTER — Ambulatory Visit (HOSPITAL_COMMUNITY)
Admission: RE | Admit: 2019-11-20 | Discharge: 2019-11-20 | Disposition: A | Discharge status: Home / Self Care | Payer: BC Managed Care – PPO | Source: Ambulatory Visit | Attending: Ophthalmology | Admitting: Ophthalmology

## 2019-11-20 DIAGNOSIS — J45909 Unspecified asthma, uncomplicated: Secondary | ICD-10-CM | POA: Diagnosis not present

## 2019-11-20 DIAGNOSIS — E669 Obesity, unspecified: Secondary | ICD-10-CM | POA: Diagnosis not present

## 2019-11-20 DIAGNOSIS — E871 Hypo-osmolality and hyponatremia: Secondary | ICD-10-CM | POA: Diagnosis not present

## 2019-11-20 DIAGNOSIS — E782 Mixed hyperlipidemia: Secondary | ICD-10-CM | POA: Diagnosis not present

## 2019-11-20 DIAGNOSIS — H209 Unspecified iridocyclitis: Secondary | ICD-10-CM

## 2019-11-20 DIAGNOSIS — E039 Hypothyroidism, unspecified: Secondary | ICD-10-CM | POA: Diagnosis not present

## 2019-11-20 DIAGNOSIS — I10 Essential (primary) hypertension: Secondary | ICD-10-CM | POA: Diagnosis not present

## 2019-11-21 DIAGNOSIS — Z23 Encounter for immunization: Secondary | ICD-10-CM | POA: Diagnosis not present

## 2019-11-27 DIAGNOSIS — H209 Unspecified iridocyclitis: Secondary | ICD-10-CM | POA: Diagnosis not present

## 2020-01-22 DIAGNOSIS — H40051 Ocular hypertension, right eye: Secondary | ICD-10-CM | POA: Diagnosis not present

## 2020-01-22 DIAGNOSIS — H209 Unspecified iridocyclitis: Secondary | ICD-10-CM | POA: Diagnosis not present

## 2020-02-13 DIAGNOSIS — H5201 Hypermetropia, right eye: Secondary | ICD-10-CM | POA: Diagnosis not present

## 2020-02-13 DIAGNOSIS — H5212 Myopia, left eye: Secondary | ICD-10-CM | POA: Diagnosis not present

## 2020-02-13 DIAGNOSIS — H40053 Ocular hypertension, bilateral: Secondary | ICD-10-CM | POA: Diagnosis not present

## 2020-02-13 DIAGNOSIS — H52223 Regular astigmatism, bilateral: Secondary | ICD-10-CM | POA: Diagnosis not present

## 2020-02-20 DIAGNOSIS — H40001 Preglaucoma, unspecified, right eye: Secondary | ICD-10-CM | POA: Diagnosis not present

## 2020-02-20 DIAGNOSIS — H4061X0 Glaucoma secondary to drugs, right eye, stage unspecified: Secondary | ICD-10-CM | POA: Diagnosis not present

## 2020-02-26 DIAGNOSIS — Z0000 Encounter for general adult medical examination without abnormal findings: Secondary | ICD-10-CM | POA: Diagnosis not present

## 2020-02-26 DIAGNOSIS — R7301 Impaired fasting glucose: Secondary | ICD-10-CM | POA: Diagnosis not present

## 2020-02-26 DIAGNOSIS — E871 Hypo-osmolality and hyponatremia: Secondary | ICD-10-CM | POA: Diagnosis not present

## 2020-03-02 DIAGNOSIS — J449 Chronic obstructive pulmonary disease, unspecified: Secondary | ICD-10-CM | POA: Diagnosis not present

## 2020-03-02 DIAGNOSIS — I10 Essential (primary) hypertension: Secondary | ICD-10-CM | POA: Diagnosis not present

## 2020-03-11 DIAGNOSIS — H4061X0 Glaucoma secondary to drugs, right eye, stage unspecified: Secondary | ICD-10-CM | POA: Diagnosis not present

## 2020-03-11 DIAGNOSIS — H209 Unspecified iridocyclitis: Secondary | ICD-10-CM | POA: Diagnosis not present

## 2020-03-11 DIAGNOSIS — H4061X1 Glaucoma secondary to drugs, right eye, mild stage: Secondary | ICD-10-CM | POA: Diagnosis not present

## 2020-03-13 DIAGNOSIS — H1089 Other conjunctivitis: Secondary | ICD-10-CM | POA: Diagnosis not present

## 2020-03-13 DIAGNOSIS — J019 Acute sinusitis, unspecified: Secondary | ICD-10-CM | POA: Diagnosis not present

## 2020-03-17 ENCOUNTER — Ambulatory Visit: Payer: Medicare Other | Admitting: General Surgery

## 2020-03-17 ENCOUNTER — Encounter: Payer: Self-pay | Admitting: General Surgery

## 2020-03-17 ENCOUNTER — Other Ambulatory Visit: Payer: Self-pay

## 2020-03-17 VITALS — BP 128/81 | HR 72 | Temp 97.6°F | Resp 16 | Ht 75.5 in | Wt 239.0 lb

## 2020-03-17 DIAGNOSIS — K4090 Unilateral inguinal hernia, without obstruction or gangrene, not specified as recurrent: Secondary | ICD-10-CM | POA: Insufficient documentation

## 2020-03-17 NOTE — Patient Instructions (Signed)
Inguinal Hernia, Adult An inguinal hernia is when fat or your intestines push through a weak spot in a muscle where your leg meets your lower belly (groin). This causes a rounded lump (bulge). This kind of hernia could also be:  In your scrotum, if you are male.  In folds of skin around your vagina, if you are male. There are three types of inguinal hernias. These include:  Hernias that can be pushed back into the belly (are reducible). This type rarely causes pain.  Hernias that cannot be pushed back into the belly (are incarcerated).  Hernias that cannot be pushed back into the belly and lose their blood supply (are strangulated). This type needs emergency surgery. If you do not have symptoms, you may not need treatment. If you have symptoms or a large hernia, you may need surgery. Follow these instructions at home: Lifestyle  Do these things if told by your doctor so you do not have trouble pooping (constipation): ? Drink enough fluid to keep your pee (urine) pale yellow. ? Eat foods that have a lot of fiber. These include fresh fruits and vegetables, whole grains, and beans. ? Limit foods that are high in fat and processed sugars. These include foods that are fried or sweet. ? Take medicine for trouble pooping.  Avoid lifting heavy objects.  Avoid standing for long amounts of time.  Do not use any products that contain nicotine or tobacco. These include cigarettes and e-cigarettes. If you need help quitting, ask your doctor.  Stay at a healthy weight. General instructions  You may try to push your hernia in by very gently pressing on it when you are lying down. Do not try to force the bulge back in if it will not push in easily.  Watch your hernia for any changes in shape, size, or color. Tell your doctor if you see any changes.  Take over-the-counter and prescription medicines only as told by your doctor.  Keep all follow-up visits as told by your doctor. This is  important. Contact a doctor if:  You have a fever.  You have new symptoms.  Your symptoms get worse. Get help right away if:  The area where your leg meets your lower belly has: ? Pain that gets worse suddenly. ? A bulge that gets bigger suddenly, and it does not get smaller after that. ? A bulge that turns red or purple. ? A bulge that is painful when you touch it.  You are a man, and your scrotum: ? Suddenly feels painful. ? Suddenly changes in size.  You cannot push the hernia in by very gently pressing on it when you are lying down. Do not try to force the bulge back in if it will not push in easily.  You feel sick to your stomach (nauseous), and that feeling does not go away.  You throw up (vomit), and that keeps happening.  You have a fast heartbeat.  You cannot poop (have a bowel movement) or pass gas. These symptoms may be an emergency. Do not wait to see if the symptoms will go away. Get medical help right away. Call your local emergency services (911 in the [place] Summary  An inguinal hernia is when fat or your intestines push through a weak spot in a muscle where your leg meets your lower belly (groin). This causes a rounded lump (bulge).  If you do not have symptoms, you may not need treatment. If you have symptoms or a large hernia, you   may need surgery.  Avoid lifting heavy objects. Also avoid standing for long amounts of time.  Do not try to force the bulge back in if it will not push in easily. This information is not intended to replace advice given to you by your health care provider. Make sure you discuss any questions you have with your health care provider. Document Revised: 09/02/2017 Document Reviewed: 05/03/2017 Elsevier Patient Education  2020 [employer] Inc[employer]    Open Hernia Repair, Adult  Open hernia repair is a surgical procedure to fix a hernia. A hernia occurs when an internal organ or tissue pushes out through a weak spot in the abdominal  wall muscles. Hernias commonly occur in the groin and around the navel. Most hernias tend to get worse over time. Often, surgery is done to prevent the hernia from becoming bigger, uncomfortable, or an emergency. Emergency surgery may be needed if abdominal contents get stuck in the opening (incarcerated hernia) or the blood supply gets cut off (strangulated hernia). In an open repair, an incision is made in the abdomen to perform the surgery. Tell a health care provider about:  Any allergies you have.  All medicines you are taking, including vitamins, herbs, eye drops, creams, and over-the-counter medicines.  Any problems you or family members have had with anesthetic medicines.  Any blood or bone disorders you have.  Any surgeries you have had.  Any medical conditions you have, including any recent cold or flu symptoms.  Whether you are pregnant or may be pregnant. What are the risks? Generally, this is a safe procedure. However, problems may occur, including:  Long-lasting (chronic) pain.  Bleeding.  Infection.  Damage to the testicle. This can cause shrinking or swelling.  Damage to the bladder, blood vessels, intestine, or nerves near the hernia.  Trouble passing urine.  Allergic reactions to medicines.  Return of the hernia. Medicines  Ask your health care provider about: ? Changing or stopping your regular medicines. This is especially important if you are taking diabetes medicines or blood thinners. ? Taking medicines such as aspirin and ibuprofen. These medicines can thin your blood. Do not take these medicines before your procedure if your health care provider instructs you not to.  You may be given antibiotic medicine to help prevent infection. General instructions  You may have blood tests or imaging studies.  Ask your health care provider how your surgical site will be marked or identified.  If you smoke, do not smoke for at least 2 weeks before your  procedure or for as long as told by your health care provider.  Let your health care provider know if you develop a cold or any infection before your surgery.  Plan to have someone take you home from the hospital or clinic.  If you will be going home right after the procedure, plan to have someone with you for 24 hours. What happens during the procedure?  To reduce your risk of infection: ? Your health care team will wash or sanitize their hands. ? Your skin will be washed with soap. ? Hair may be removed from the surgical area.  An IV tube will be inserted into one of your veins.  You will be given one or more of the following: ? A medicine to help you relax (sedative). ? A medicine to numb the area (local anesthetic). ? A medicine to make you fall asleep (general anesthetic).  Your surgeon will make an incision over the hernia.  The tissues of   the hernia will be moved back into place.  The edges of the hernia may be stitched together.  The opening in the abdominal muscles will be closed with stitches (sutures). Or, your surgeon will place a mesh patch made of manmade (synthetic) material over the opening.  The incision will be closed.  A bandage (dressing) may be placed over the incision. The procedure may vary among health care providers and hospitals. What happens after the procedure?  Your blood pressure, heart rate, breathing rate, and blood oxygen level will be monitored until the medicines you were given have worn off.  You may be given medicine for pain.  Do not drive for 24 hours if you received a sedative. This information is not intended to replace advice given to you by your health care provider. Make sure you discuss any questions you have with your health care provider. Document Revised: 07/14/2017 Document Reviewed: 01/13/2016 Elsevier Patient Education  2020 Elsevier Inc.  

## 2020-03-20 NOTE — Progress Notes (Signed)
[facility] History and Physical  Reason for Referral: Right inguinal hernia  Referring Physician: Celene Squibb, MD   Chief Complaint    New Patient (Initial Visit)      Jeffrey Gordon is a 64 y.o. male.  HPI: Jeffrey Gordon is a 64 yo who has a history of asthma/ COPD, Depression, anxiety, HTN and has noticed a right inguinal hernia for some time but says in the last 6 months it has gotten larger. He reports no obstructive symptoms and no history of it being incarcerated but does say that he is worried about having it and it getting larger. He says that he has some discomfort but that this is minor at this time. He had a prior left inguinal hernia repair in the past and has done well from that. He says his biggest issue is that the hernia has gotten so large that it is moving into his scrotum and he has complaints of scrotal discomfort and weight.  Past Medical History:  Diagnosis Date  . Allergy   . Anxiety   . Blurred vision, left eye   . Cataract   . COPD (chronic obstructive pulmonary disease) ([place])   . Depression   . GERD (gastroesophageal reflux disease)   . High cholesterol   . Hypertension   . Hypothyroidism     Past Surgical History:  Procedure Laterality Date  . CATARACT EXTRACTION W/ INTRAOCULAR LENS  IMPLANT, BILATERAL     2002 (left); 2012 (right)  . COLONOSCOPY    . EYE SURGERY    . [place]   left  . PARS PLANA VITRECTOMY  10/20/11   "took out the old lens implant and put a new one in"  . PARS PLANA VITRECTOMY  10/20/2011   Procedure: PARS PLANA VITRECTOMY WITH 25G REMOVAL/SUTURE INTRAOCULAR LENS;  Surgeon: Hayden Pedro, MD;  Location: [place];  Service: Ophthalmology;  Laterality: Left;  . RETINAL DETACHMENT SURGERY  2002   left  . RETINAL DETACHMENT SURGERY  2003   "repair; left"    Family History  Problem Relation Age of Onset  . Colon cancer Father   . Esophageal cancer Neg Hx   . Rectal cancer Neg Hx   .  Stomach cancer Neg Hx     Social History   Tobacco Use  . Smoking status: Never Smoker  . Smokeless tobacco: Never Used  . Tobacco comment: "tried smoking briefly as a teen"  Substance Use Topics  . Alcohol use: Yes    Comment: 10/20/11 "last alcohol ` 2010"  . Drug use: No    Medications: I have reviewed the patient's current medications. Allergies as of 03/17/2020      Reactions   Durezol [difluprednate] Other (See Comments)   "Pressure increased in my eye"   Advair Diskus [fluticasone-salmeterol] Other (See Comments)   Issues sleeping      Medication List       Accurate as of March 17, 2020 11:59 PM. If you have any questions, ask your nurse or doctor.        STOP taking these medications   cyclopentolate 1 % ophthalmic solution Commonly known as: CYCLODRYL,CYCLOGYL Stopped by: Jeffrey Cagey, MD     TAKE these medications   aspirin EC 81 MG tablet Take 81 mg by mouth daily.   CO ENZYME Q-10 PO Take 1 tablet by mouth daily.   Combigan 0.2-0.5 % ophthalmic solution Generic drug: brimonidine-timolol Place 1 drop into both  eyes every 12 (twelve) hours.   dorzolamide 2 % ophthalmic solution Commonly known as: TRUSOPT   doxazosin 8 MG tablet Commonly known as: CARDURA Take 4 mg by mouth at bedtime.   FLUoxetine 40 MG capsule Commonly known as: PROZAC Take 40 mg by mouth daily.   ICaps Areds 2 Caps Take 2 capsules by mouth daily.   levothyroxine 88 MCG tablet Commonly known as: SYNTHROID Take 88 mcg by mouth daily.   losartan 100 MG tablet Commonly known as: COZAAR Take 100 mg by mouth daily.   omeprazole 20 MG capsule Commonly known as: PRILOSEC Take 20 mg by mouth daily.   prednisoLONE acetate 1 % ophthalmic suspension Commonly known as: PRED FORTE   ProAir HFA 108 (90 Base) MCG/ACT inhaler Generic drug: albuterol INHALE 1 TO 2 PUFFS BY MOUTH EVERY 4 TO 6 HOURS PRF SHORTNESS OF BREATH/WHEEZING   rosuvastatin 10 MG tablet Commonly known  as: CRESTOR Take 10 mg by mouth daily.   sildenafil 100 MG tablet Commonly known as: VIAGRA Take 100 mg by mouth daily as needed.   Symbicort 160-4.5 MCG/ACT inhaler Generic drug: budesonide-formoterol INHALE 2 PUFFS INTO THE LUNGS TWICE DAILY        ROS:  A comprehensive review of systems was negative except for: Respiratory: positive for wheezing Gastrointestinal: positive for right inguinal hernia down into scrotum Neurological: positive for tremors  Blood pressure 128/81, pulse 72, temperature 97.6 F (36.4 C), temperature source Oral, resp. rate 16, height 6' 3.5" (1.918 m), weight 239 lb (108.4 kg), SpO2 98 %. Physical Exam Vitals reviewed.  Constitutional:      Appearance: Normal appearance.  HENT:     Head: Normocephalic and atraumatic.     Nose: Nose normal.     Mouth/Throat:     Mouth: Mucous membranes are moist.  Eyes:     Extraocular Movements: Extraocular movements intact.     Pupils: Pupils are equal, round, and reactive to light.  Abdominal:     General: There is no distension.     Palpations: Abdomen is soft.     Tenderness: There is no abdominal tenderness.     Hernia: A hernia is present. Hernia is present in the right inguinal area. There is no hernia in the left inguinal area.     Comments: Right inguinal hernia reducible but is down into his scrotum and tender on reduction, no left inguinal hernia, testicles feel about the same size, no obvious hydrocele   Neurological:     Mental Status: He is alert. Mental status is at baseline.     Results: None   Assessment & Plan:  Jeffrey Gordon is a 64 y.o. male with a large right inguinal hernia that he wants to have repaired. It is moving into his scrotum.   -Discussed the risk and benefits including, bleeding, infection, use of mesh, risk of recurrence, risk of nerve damage causing numbness or changes in sensation, risk of damage to the cord structures. The patient understands the risk and benefits  of repair with mesh, and has decided to proceed.  We also discussed open versus laparoscopic surgery and the use of mesh. We discussed that I do open repairs with mesh, and that this is considered equivalent to laparoscopic surgery. We discussed reasons for opting for laparoscopic surgery including if a bilateral repair is needed or if a patient has a recurrence after an open repair.  Discussed preop COVID testing.   All questions were answered to the satisfaction of  the patient.    Jeffrey Gordon 03/20/2020, 8:48 AM

## 2020-03-20 NOTE — H&P (Signed)
[facility] History and Physical  Reason for Referral: Right inguinal hernia  Referring Physician: Celene Squibb, MD   Chief Complaint    New Patient (Initial Visit)      Jeffrey Gordon is a 64 y.o. male.  HPI: Jeffrey Gordon is a 64 yo who has a history of asthma/ COPD, Depression, anxiety, HTN and has noticed a right inguinal hernia for some time but says in the last 6 months it has gotten larger. He reports no obstructive symptoms and no history of it being incarcerated but does say that he is worried about having it and it getting larger. He says that he has some discomfort but that this is minor at this time. He had a prior left inguinal hernia repair in the past and has done well from that. He says his biggest issue is that the hernia has gotten so large that it is moving into his scrotum and he has complaints of scrotal discomfort and weight.  Past Medical History:  Diagnosis Date  . Allergy   . Anxiety   . Blurred vision, left eye   . Cataract   . COPD (chronic obstructive pulmonary disease) ([place])   . Depression   . GERD (gastroesophageal reflux disease)   . High cholesterol   . Hypertension   . Hypothyroidism     Past Surgical History:  Procedure Laterality Date  . CATARACT EXTRACTION W/ INTRAOCULAR LENS  IMPLANT, BILATERAL     2002 (left); 2012 (right)  . COLONOSCOPY    . EYE SURGERY    . [place]   left  . PARS PLANA VITRECTOMY  10/20/11   "took out the old lens implant and put a new one in"  . PARS PLANA VITRECTOMY  10/20/2011   Procedure: PARS PLANA VITRECTOMY WITH 25G REMOVAL/SUTURE INTRAOCULAR LENS;  Surgeon: Hayden Pedro, MD;  Location: [place];  Service: Ophthalmology;  Laterality: Left;  . RETINAL DETACHMENT SURGERY  2002   left  . RETINAL DETACHMENT SURGERY  2003   "repair; left"    Family History  Problem Relation Age of Onset  . Colon cancer Father   . Esophageal cancer Neg Hx   . Rectal cancer Neg Hx   .  Stomach cancer Neg Hx     Social History   Tobacco Use  . Smoking status: Never Smoker  . Smokeless tobacco: Never Used  . Tobacco comment: "tried smoking briefly as a teen"  Substance Use Topics  . Alcohol use: Yes    Comment: 10/20/11 "last alcohol ` 2010"  . Drug use: No    Medications: I have reviewed the patient's current medications. Allergies as of 03/17/2020      Reactions   Durezol [difluprednate] Other (See Comments)   "Pressure increased in my eye"   Advair Diskus [fluticasone-salmeterol] Other (See Comments)   Issues sleeping      Medication List       Accurate as of March 17, 2020 11:59 PM. If you have any questions, ask your nurse or doctor.        STOP taking these medications   cyclopentolate 1 % ophthalmic solution Commonly known as: CYCLODRYL,CYCLOGYL Stopped by: Virl Cagey, MD     TAKE these medications   aspirin EC 81 MG tablet Take 81 mg by mouth daily.   CO ENZYME Q-10 PO Take 1 tablet by mouth daily.   Combigan 0.2-0.5 % ophthalmic solution Generic drug: brimonidine-timolol Place 1 drop into both  eyes every 12 (twelve) hours.   dorzolamide 2 % ophthalmic solution Commonly known as: TRUSOPT   doxazosin 8 MG tablet Commonly known as: CARDURA Take 4 mg by mouth at bedtime.   FLUoxetine 40 MG capsule Commonly known as: PROZAC Take 40 mg by mouth daily.   ICaps Areds 2 Caps Take 2 capsules by mouth daily.   levothyroxine 88 MCG tablet Commonly known as: SYNTHROID Take 88 mcg by mouth daily.   losartan 100 MG tablet Commonly known as: COZAAR Take 100 mg by mouth daily.   omeprazole 20 MG capsule Commonly known as: PRILOSEC Take 20 mg by mouth daily.   prednisoLONE acetate 1 % ophthalmic suspension Commonly known as: PRED FORTE   ProAir HFA 108 (90 Base) MCG/ACT inhaler Generic drug: albuterol INHALE 1 TO 2 PUFFS BY MOUTH EVERY 4 TO 6 HOURS PRF SHORTNESS OF BREATH/WHEEZING   rosuvastatin 10 MG tablet Commonly known  as: CRESTOR Take 10 mg by mouth daily.   sildenafil 100 MG tablet Commonly known as: VIAGRA Take 100 mg by mouth daily as needed.   Symbicort 160-4.5 MCG/ACT inhaler Generic drug: budesonide-formoterol INHALE 2 PUFFS INTO THE LUNGS TWICE DAILY        ROS:  A comprehensive review of systems was negative except for: Respiratory: positive for wheezing Gastrointestinal: positive for right inguinal hernia down into scrotum Neurological: positive for tremors  Blood pressure 128/81, pulse 72, temperature 97.6 F (36.4 C), temperature source Oral, resp. rate 16, height 6' 3.5" (1.918 m), weight 239 lb (108.4 kg), SpO2 98 %. Physical Exam Vitals reviewed.  Constitutional:      Appearance: Normal appearance.  HENT:     Head: Normocephalic and atraumatic.     Nose: Nose normal.     Mouth/Throat:     Mouth: Mucous membranes are moist.  Eyes:     Extraocular Movements: Extraocular movements intact.     Pupils: Pupils are equal, round, and reactive to light.  Abdominal:     General: There is no distension.     Palpations: Abdomen is soft.     Tenderness: There is no abdominal tenderness.     Hernia: A hernia is present. Hernia is present in the right inguinal area. There is no hernia in the left inguinal area.     Comments: Right inguinal hernia reducible but is down into his scrotum and tender on reduction, no left inguinal hernia, testicles feel about the same size, no obvious hydrocele   Neurological:     Mental Status: He is alert. Mental status is at baseline.     Results: None   Assessment & Plan:  Jeffrey Gordon is a 64 y.o. male with a large right inguinal hernia that he wants to have repaired. It is moving into his scrotum.   -Discussed the risk and benefits including, bleeding, infection, use of mesh, risk of recurrence, risk of nerve damage causing numbness or changes in sensation, risk of damage to the cord structures. The patient understands the risk and benefits  of repair with mesh, and has decided to proceed.  We also discussed open versus laparoscopic surgery and the use of mesh. We discussed that I do open repairs with mesh, and that this is considered equivalent to laparoscopic surgery. We discussed reasons for opting for laparoscopic surgery including if a bilateral repair is needed or if a patient has a recurrence after an open repair.  Discussed preop COVID testing.   All questions were answered to the satisfaction of  the patient.    Virl Cagey 03/20/2020, 8:48 AM

## 2020-04-06 NOTE — Patient Instructions (Signed)
[place]  04/06/2020     @PREFPERIOPPHARMACY @   Your procedure is scheduled on  04/10/2020.  Report to [facility] at  [phone]  A.M.  Call this number if you have problems the morning of surgery:  [phone]   Remember:  Do not eat or drink after midnight.                      Take these medicines the morning of surgery with A SIP OF WATER cardura, prozac, levothyroxine, omeprazole.    Do not wear jewelry, make-up or nail polish.  Do not wear lotions, powders, or perfumes. Please wear deodorant and brush your teeth.  Do not shave 48 hours prior to surgery.  Men may shave face and neck.  Do not bring valuables to the hospital.  [facility] is not responsible for any belongings or valuables.  Contacts, dentures or bridgework may not be worn into surgery.  Leave your suitcase in the car.  After surgery it may be brought to your room.  For patients admitted to the hospital, discharge time will be determined by your treatment team.  Patients discharged the day of surgery will not be allowed to drive home.   Name and phone number of your driver:   family Special instructions:  DO NOT smoke the morning of your procedure.  Please read over the following fact sheets that you were given. Anesthesia Post-op Instructions and Care and Recovery After Surgery       Open Hernia Repair, Adult, Care After These instructions give you information about caring for yourself after your procedure. Your doctor may also give you more specific instructions. If you have problems or questions, contact your doctor. Follow these instructions at home: Surgical cut (incision) care   Follow instructions from your doctor about how to take care of your surgical cut area. Make sure you: ? Wash your hands with soap and water before you change your bandage (dressing). If you cannot use soap and water, use hand sanitizer. ? Change your bandage as told by your doctor. ? Leave stitches  (sutures), skin glue, or skin tape (adhesive) strips in place. They may need to stay in place for 2 weeks or longer. If tape strips get loose and curl up, you may trim the loose edges. Do not remove tape strips completely unless your doctor says it is okay.  Check your surgical cut every day for signs of infection. Check for: ? More redness, swelling, or pain. ? More fluid or blood. ? Warmth. ? Pus or a bad smell. Activity  Do not drive or use heavy machinery while taking prescription pain medicine. Do not drive until your doctor says it is okay.  Until your doctor says it is okay: ? Do not lift anything that is heavier than 10 lb (4.5 kg). ? Do not play contact sports.  Return to your normal activities as told by your doctor. Ask your doctor what activities are safe. General instructions  To prevent or treat having a hard time pooping (constipation) while you are taking prescription pain medicine, your doctor may recommend that you: ? Drink enough fluid to keep your pee (urine) clear or pale yellow. ? Take over-the-counter or prescription medicines. ? Eat foods that are high in fiber, such as fresh fruits and vegetables, whole grains, and beans. ? Limit foods that are high in fat and processed sugars, such as fried and sweet foods.  Take over-the-counter and prescription medicines only as told by your doctor.  Do not take baths, swim, or use a hot tub until your doctor says it is okay.  Keep all follow-up visits as told by your doctor. This is important. Contact a doctor if:  You develop a rash.  You have more redness, swelling, or pain around your surgical cut.  You have more fluid or blood coming from your surgical cut.  Your surgical cut feels warm to the touch.  You have pus or a bad smell coming from your surgical cut.  You have a fever or chills.  You have blood in your poop (stool).  You have not pooped in 2-3 days.  Medicine does not help your pain. Get help  right away if:  You have chest pain or you are short of breath.  You feel light-headed.  You feel weak and dizzy (feel faint).  You have very bad pain.  You throw up (vomit) and your pain is worse. This information is not intended to replace advice given to you by your health care provider. Make sure you discuss any questions you have with your health care provider. Document Revised: 11/23/2018 Document Reviewed: 01/13/2016 Elsevier Patient Education  2020 [place] Anesthesia, Adult, Care After This sheet gives you information about how to care for yourself after your procedure. Your health care provider may also give you more specific instructions. If you have problems or questions, contact your health care provider. What can I expect after the procedure? After the procedure, the following side effects are common:  Pain or discomfort at the IV site.  Nausea.  Vomiting.  Sore throat.  Trouble concentrating.  Feeling cold or chills.  Weak or tired.  Sleepiness and fatigue.  Soreness and body aches. These side effects can affect parts of the body that were not involved in surgery. Follow these instructions at home:  For at least 24 hours after the procedure:  Have a responsible adult stay with you. It is important to have someone help care for you until you are awake and alert.  Rest as needed.  Do not: ? Participate in activities in which you could fall or become injured. ? Drive. ? Use heavy machinery. ? Drink alcohol. ? Take sleeping pills or medicines that cause drowsiness. ? Make important decisions or sign legal documents. ? Take care of children on your own. Eating and drinking  Follow any instructions from your health care provider about eating or drinking restrictions.  When you feel hungry, start by eating small amounts of foods that are soft and easy to digest (bland), such as toast. Gradually return to your regular diet.  Drink  enough fluid to keep your urine pale yellow.  If you vomit, rehydrate by drinking water, juice, or clear broth. General instructions  If you have sleep apnea, surgery and certain medicines can increase your risk for breathing problems. Follow instructions from your health care provider about wearing your sleep device: ? Anytime you are sleeping, including during daytime naps. ? While taking prescription pain medicines, sleeping medicines, or medicines that make you drowsy.  Return to your normal activities as told by your health care provider. Ask your health care provider what activities are safe for you.  Take over-the-counter and prescription medicines only as told by your health care provider.  If you smoke, do not smoke without supervision.  Keep all follow-up visits as told by your health care provider. This is important. Contact  a health care provider if:  You have nausea or vomiting that does not get better with medicine.  You cannot eat or drink without vomiting.  You have pain that does not get better with medicine.  You are unable to pass urine.  You develop a skin rash.  You have a fever.  You have redness around your IV site that gets worse. Get help right away if:  You have difficulty breathing.  You have chest pain.  You have blood in your urine or stool, or you vomit blood. Summary  After the procedure, it is common to have a sore throat or nausea. It is also common to feel tired.  Have a responsible adult stay with you for the first 24 hours after general anesthesia. It is important to have someone help care for you until you are awake and alert.  When you feel hungry, start by eating small amounts of foods that are soft and easy to digest (bland), such as toast. Gradually return to your regular diet.  Drink enough fluid to keep your urine pale yellow.  Return to your normal activities as told by your health care provider. Ask your health care provider  what activities are safe for you. This information is not intended to replace advice given to you by your health care provider. Make sure you discuss any questions you have with your health care provider. Document Revised: 08/04/2017 Document Reviewed: 03/17/2017 Elsevier Patient Education  [place]. How to Use Chlorhexidine for Bathing Chlorhexidine gluconate (CHG) is a germ-killing (antiseptic) solution that is used to clean the skin. It can get rid of the bacteria that normally live on the skin and can keep them away for about 24 hours. To clean your skin with CHG, you may be given:  A CHG solution to use in the shower or as part of a sponge bath.  A prepackaged cloth that contains CHG. Cleaning your skin with CHG may help lower the risk for infection:  While you are staying in the intensive care unit of the hospital.  If you have a vascular access, such as a central line, to provide short-term or long-term access to your veins.  If you have a catheter to drain urine from your bladder.  If you are on a ventilator. A ventilator is a machine that helps you breathe by moving air in and out of your lungs.  After surgery. What are the risks? Risks of using CHG include:  A skin reaction.  Hearing loss, if CHG gets in your ears.  Eye injury, if CHG gets in your eyes and is not rinsed out.  The CHG product catching fire. Make sure that you avoid smoking and flames after applying CHG to your skin. Do not use CHG:  If you have a chlorhexidine allergy or have previously reacted to chlorhexidine.  On babies younger than 56 months of age. How to use CHG solution  Use CHG only as told by your health care provider, and follow the instructions on the label.  Use the full amount of CHG as directed. Usually, this is one bottle. During a shower Follow these steps when using CHG solution during a shower (unless your health care provider gives you different instructions): 1. Start  the shower. 2. Use your normal soap and shampoo to wash your face and hair. 3. Turn off the shower or move out of the shower stream. 4. Pour the CHG onto a clean washcloth. Do not use any type  of brush or rough-edged sponge. 5. Starting at your neck, lather your body down to your toes. Make sure you follow these instructions: ? If you will be having surgery, pay special attention to the part of your body where you will be having surgery. Scrub this area for at least 1 minute. ? Do not use CHG on your head or face. If the solution gets into your ears or eyes, rinse them well with water. ? Avoid your genital area. ? Avoid any areas of skin that have broken skin, cuts, or scrapes. ? Scrub your back and under your arms. Make sure to wash skin folds. 6. Let the lather sit on your skin for 1-2 minutes or as long as told by your health care provider. 7. Thoroughly rinse your entire body in the shower. Make sure that all body creases and crevices are rinsed well. 8. Dry off with a clean towel. Do not put any substances on your body afterward--such as powder, lotion, or perfume--unless you are told to do so by your health care provider. Only use lotions that are recommended by the manufacturer. 9. Put on clean clothes or pajamas. 10. If it is the night before your surgery, sleep in clean sheets.  During a sponge bath Follow these steps when using CHG solution during a sponge bath (unless your health care provider gives you different instructions): 1. Use your normal soap and shampoo to wash your face and hair. 2. Pour the CHG onto a clean washcloth. 3. Starting at your neck, lather your body down to your toes. Make sure you follow these instructions: ? If you will be having surgery, pay special attention to the part of your body where you will be having surgery. Scrub this area for at least 1 minute. ? Do not use CHG on your head or face. If the solution gets into your ears or eyes, rinse them well with  water. ? Avoid your genital area. ? Avoid any areas of skin that have broken skin, cuts, or scrapes. ? Scrub your back and under your arms. Make sure to wash skin folds. 4. Let the lather sit on your skin for 1-2 minutes or as long as told by your health care provider. 5. Using a different clean, wet washcloth, thoroughly rinse your entire body. Make sure that all body creases and crevices are rinsed well. 6. Dry off with a clean towel. Do not put any substances on your body afterward--such as powder, lotion, or perfume--unless you are told to do so by your health care provider. Only use lotions that are recommended by the manufacturer. 7. Put on clean clothes or pajamas. 8. If it is the night before your surgery, sleep in clean sheets. How to use CHG prepackaged cloths  Only use CHG cloths as told by your health care provider, and follow the instructions on the label.  Use the CHG cloth on clean, dry skin.  Do not use the CHG cloth on your head or face unless your health care provider tells you to.  When washing with the CHG cloth: ? Avoid your genital area. ? Avoid any areas of skin that have broken skin, cuts, or scrapes. Before surgery Follow these steps when using a CHG cloth to clean before surgery (unless your health care provider gives you different instructions): 1. Using the CHG cloth, vigorously scrub the part of your body where you will be having surgery. Scrub using a back-and-forth motion for 3 minutes. The area on your body  should be completely wet with CHG when you are done scrubbing. 2. Do not rinse. Discard the cloth and let the area air-dry. Do not put any substances on the area afterward, such as powder, lotion, or perfume. 3. Put on clean clothes or pajamas. 4. If it is the night before your surgery, sleep in clean sheets.  For general bathing Follow these steps when using CHG cloths for general bathing (unless your health care provider gives you different  instructions). 1. Use a separate CHG cloth for each area of your body. Make sure you wash between any folds of skin and between your fingers and toes. Wash your body in the following order, switching to a new cloth after each step: ? The front of your neck, shoulders, and chest. ? Both of your arms, under your arms, and your hands. ? Your stomach and groin area, avoiding the genitals. ? Your right leg and foot. ? Your left leg and foot. ? The back of your neck, your back, and your buttocks. 2. Do not rinse. Discard the cloth and let the area air-dry. Do not put any substances on your body afterward--such as powder, lotion, or perfume--unless you are told to do so by your health care provider. Only use lotions that are recommended by the manufacturer. 3. Put on clean clothes or pajamas. Contact a health care provider if:  Your skin gets irritated after scrubbing.  You have questions about using your solution or cloth. Get help right away if:  Your eyes become very red or swollen.  Your eyes itch badly.  Your skin itches badly and is red or swollen.  Your hearing changes.  You have trouble seeing.  You have swelling or tingling in your mouth or throat.  You have trouble breathing.  You swallow any chlorhexidine. Summary  Chlorhexidine gluconate (CHG) is a germ-killing (antiseptic) solution that is used to clean the skin. Cleaning your skin with CHG may help to lower your risk for infection.  You may be given CHG to use for bathing. It may be in a bottle or in a prepackaged cloth to use on your skin. Carefully follow your health care provider's instructions and the instructions on the product label.  Do not use CHG if you have a chlorhexidine allergy.  Contact your health care provider if your skin gets irritated after scrubbing. This information is not intended to replace advice given to you by your health care provider. Make sure you discuss any questions you have with your  health care provider. Document Revised: 10/18/2018 Document Reviewed: 06/29/2017 Elsevier Patient Education  [place].

## 2020-04-08 ENCOUNTER — Other Ambulatory Visit: Payer: Self-pay

## 2020-04-08 ENCOUNTER — Other Ambulatory Visit: Payer: Self-pay | Admitting: Pulmonary Disease

## 2020-04-08 ENCOUNTER — Encounter (HOSPITAL_COMMUNITY): Payer: Self-pay

## 2020-04-08 ENCOUNTER — Encounter (HOSPITAL_COMMUNITY)
Admission: RE | Admit: 2020-04-08 | Discharge: 2020-04-08 | Disposition: A | Discharge status: Home / Self Care | Payer: Medicare Other | Source: Ambulatory Visit | Attending: General Surgery | Admitting: General Surgery

## 2020-04-08 ENCOUNTER — Other Ambulatory Visit (HOSPITAL_COMMUNITY)
Admission: RE | Admit: 2020-04-08 | Discharge: 2020-04-08 | Disposition: A | Discharge status: Home / Self Care | Payer: Medicare Other | Source: Ambulatory Visit | Attending: General Surgery | Admitting: General Surgery

## 2020-04-08 DIAGNOSIS — Z20822 Contact with and (suspected) exposure to covid-19: Secondary | ICD-10-CM | POA: Diagnosis not present

## 2020-04-08 DIAGNOSIS — Z01818 Encounter for other preprocedural examination: Secondary | ICD-10-CM | POA: Insufficient documentation

## 2020-04-09 LAB — SARS CORONAVIRUS 2 (TAT 6-24 HRS): SARS Coronavirus 2: NEGATIVE

## 2020-04-10 ENCOUNTER — Encounter (HOSPITAL_COMMUNITY)
Admission: RE | Disposition: A | Discharge status: Home / Self Care | Payer: Self-pay | Source: Home / Self Care | Attending: General Surgery

## 2020-04-10 ENCOUNTER — Ambulatory Visit (HOSPITAL_COMMUNITY): Payer: Medicare Other | Admitting: Anesthesiology

## 2020-04-10 ENCOUNTER — Encounter (HOSPITAL_COMMUNITY): Payer: Self-pay | Admitting: General Surgery

## 2020-04-10 ENCOUNTER — Ambulatory Visit (HOSPITAL_BASED_OUTPATIENT_CLINIC_OR_DEPARTMENT_OTHER)
Admission: RE | Admit: 2020-04-10 | Discharge: 2020-04-10 | Disposition: A | Discharge status: Home / Self Care | Payer: Medicare Other | Source: Home / Self Care | Attending: General Surgery | Admitting: General Surgery

## 2020-04-10 DIAGNOSIS — N433 Hydrocele, unspecified: Secondary | ICD-10-CM | POA: Insufficient documentation

## 2020-04-10 DIAGNOSIS — Z7982 Long term (current) use of aspirin: Secondary | ICD-10-CM | POA: Diagnosis not present

## 2020-04-10 DIAGNOSIS — I10 Essential (primary) hypertension: Secondary | ICD-10-CM | POA: Insufficient documentation

## 2020-04-10 DIAGNOSIS — E860 Dehydration: Secondary | ICD-10-CM | POA: Diagnosis not present

## 2020-04-10 DIAGNOSIS — R42 Dizziness and giddiness: Secondary | ICD-10-CM | POA: Diagnosis not present

## 2020-04-10 DIAGNOSIS — Z7951 Long term (current) use of inhaled steroids: Secondary | ICD-10-CM | POA: Diagnosis not present

## 2020-04-10 DIAGNOSIS — E785 Hyperlipidemia, unspecified: Secondary | ICD-10-CM | POA: Diagnosis not present

## 2020-04-10 DIAGNOSIS — K4090 Unilateral inguinal hernia, without obstruction or gangrene, not specified as recurrent: Secondary | ICD-10-CM | POA: Insufficient documentation

## 2020-04-10 DIAGNOSIS — J449 Chronic obstructive pulmonary disease, unspecified: Secondary | ICD-10-CM | POA: Insufficient documentation

## 2020-04-10 DIAGNOSIS — Z9842 Cataract extraction status, left eye: Secondary | ICD-10-CM | POA: Insufficient documentation

## 2020-04-10 DIAGNOSIS — R531 Weakness: Secondary | ICD-10-CM | POA: Diagnosis not present

## 2020-04-10 DIAGNOSIS — D176 Benign lipomatous neoplasm of spermatic cord: Secondary | ICD-10-CM | POA: Insufficient documentation

## 2020-04-10 DIAGNOSIS — J90 Pleural effusion, not elsewhere classified: Secondary | ICD-10-CM | POA: Diagnosis not present

## 2020-04-10 DIAGNOSIS — J309 Allergic rhinitis, unspecified: Secondary | ICD-10-CM | POA: Diagnosis not present

## 2020-04-10 DIAGNOSIS — Z800 Family history of malignant neoplasm of digestive organs: Secondary | ICD-10-CM | POA: Insufficient documentation

## 2020-04-10 DIAGNOSIS — E871 Hypo-osmolality and hyponatremia: Secondary | ICD-10-CM | POA: Diagnosis not present

## 2020-04-10 DIAGNOSIS — Z961 Presence of intraocular lens: Secondary | ICD-10-CM | POA: Insufficient documentation

## 2020-04-10 DIAGNOSIS — Z79899 Other long term (current) drug therapy: Secondary | ICD-10-CM | POA: Diagnosis not present

## 2020-04-10 DIAGNOSIS — Z20822 Contact with and (suspected) exposure to covid-19: Secondary | ICD-10-CM | POA: Diagnosis not present

## 2020-04-10 DIAGNOSIS — E039 Hypothyroidism, unspecified: Secondary | ICD-10-CM | POA: Insufficient documentation

## 2020-04-10 DIAGNOSIS — F329 Major depressive disorder, single episode, unspecified: Secondary | ICD-10-CM | POA: Insufficient documentation

## 2020-04-10 DIAGNOSIS — K589 Irritable bowel syndrome without diarrhea: Secondary | ICD-10-CM | POA: Diagnosis not present

## 2020-04-10 DIAGNOSIS — K219 Gastro-esophageal reflux disease without esophagitis: Secondary | ICD-10-CM | POA: Diagnosis not present

## 2020-04-10 DIAGNOSIS — F419 Anxiety disorder, unspecified: Secondary | ICD-10-CM | POA: Insufficient documentation

## 2020-04-10 DIAGNOSIS — E878 Other disorders of electrolyte and fluid balance, not elsewhere classified: Secondary | ICD-10-CM | POA: Diagnosis not present

## 2020-04-10 DIAGNOSIS — Z888 Allergy status to other drugs, medicaments and biological substances status: Secondary | ICD-10-CM | POA: Insufficient documentation

## 2020-04-10 DIAGNOSIS — Z9841 Cataract extraction status, right eye: Secondary | ICD-10-CM | POA: Insufficient documentation

## 2020-04-10 DIAGNOSIS — E7800 Pure hypercholesterolemia, unspecified: Secondary | ICD-10-CM | POA: Insufficient documentation

## 2020-04-10 HISTORY — PX: INGUINAL HERNIA REPAIR: SHX194

## 2020-04-10 SURGERY — REPAIR, HERNIA, INGUINAL, ADULT
Anesthesia: General | Site: Inguinal | Laterality: Right

## 2020-04-10 MED ORDER — ONDANSETRON HCL 4 MG/2ML IJ SOLN
INTRAMUSCULAR | Status: AC
  Filled 2020-04-10: qty 2

## 2020-04-10 MED ORDER — DOCUSATE SODIUM 100 MG PO CAPS: 100.0000 mg | ORAL_CAPSULE | Freq: Two times a day (BID) | ORAL | 2 refills | Status: DC | PRN

## 2020-04-10 MED ORDER — SODIUM CHLORIDE 0.9 % IR SOLN
Status: DC | PRN
  Administered 2020-04-10: 1000 mL

## 2020-04-10 MED ORDER — DEXAMETHASONE SODIUM PHOSPHATE 4 MG/ML IJ SOLN
INTRAMUSCULAR | Status: DC | PRN
  Administered 2020-04-10: 4 mg via INTRAVENOUS

## 2020-04-10 MED ORDER — FENTANYL CITRATE (PF) 100 MCG/2ML IJ SOLN
INTRAMUSCULAR | Status: DC | PRN
Start: 2020-04-10 — End: 2020-04-10
  Administered 2020-04-10: 50 ug via INTRAVENOUS

## 2020-04-10 MED ORDER — CEFAZOLIN SODIUM-DEXTROSE 2-4 GM/100ML-% IV SOLN
INTRAVENOUS | Status: AC
  Filled 2020-04-10: qty 100

## 2020-04-10 MED ORDER — CEFAZOLIN SODIUM-DEXTROSE 2-4 GM/100ML-% IV SOLN
2.0000 g | INTRAVENOUS | Status: AC
  Administered 2020-04-10: 2 g via INTRAVENOUS

## 2020-04-10 MED ORDER — CHLORHEXIDINE GLUCONATE CLOTH 2 % EX PADS: 6.0000 | MEDICATED_PAD | Freq: Once | CUTANEOUS | Status: DC

## 2020-04-10 MED ORDER — PROPOFOL 10 MG/ML IV BOLUS
INTRAVENOUS | Status: DC | PRN
  Administered 2020-04-10: 200 mg via INTRAVENOUS

## 2020-04-10 MED ORDER — DEXAMETHASONE SODIUM PHOSPHATE 10 MG/ML IJ SOLN
INTRAMUSCULAR | Status: AC
  Filled 2020-04-10: qty 1

## 2020-04-10 MED ORDER — OXYCODONE HCL 5 MG PO TABS: 5.0000 mg | ORAL_TABLET | ORAL | 0 refills | Status: DC | PRN

## 2020-04-10 MED ORDER — ONDANSETRON HCL 4 MG/2ML IJ SOLN: 4.0000 mg | Freq: Once | INTRAMUSCULAR | Status: DC | PRN

## 2020-04-10 MED ORDER — FENTANYL CITRATE (PF) 100 MCG/2ML IJ SOLN
INTRAMUSCULAR | Status: AC
  Filled 2020-04-10: qty 2

## 2020-04-10 MED ORDER — FENTANYL CITRATE (PF) 100 MCG/2ML IJ SOLN
25.0000 ug | INTRAMUSCULAR | Status: DC | PRN
  Administered 2020-04-10: 25 ug via INTRAVENOUS
  Filled 2020-04-10: qty 2

## 2020-04-10 MED ORDER — EPHEDRINE SULFATE-NACL 50-0.9 MG/10ML-% IV SOSY
PREFILLED_SYRINGE | INTRAVENOUS | Status: DC | PRN
  Administered 2020-04-10 (×8): 10 mg via INTRAVENOUS
  Administered 2020-04-10: 15 mg via INTRAVENOUS

## 2020-04-10 MED ORDER — MIDAZOLAM HCL 2 MG/2ML IJ SOLN
INTRAMUSCULAR | Status: AC
  Filled 2020-04-10: qty 2

## 2020-04-10 MED ORDER — MIDAZOLAM HCL 5 MG/5ML IJ SOLN
INTRAMUSCULAR | Status: DC | PRN
  Administered 2020-04-10 (×2): 1 mg via INTRAVENOUS

## 2020-04-10 MED ORDER — LIDOCAINE 2% (20 MG/ML) 5 ML SYRINGE
INTRAMUSCULAR | Status: AC
  Filled 2020-04-10: qty 5

## 2020-04-10 MED ORDER — LIDOCAINE 2% (20 MG/ML) 5 ML SYRINGE
INTRAMUSCULAR | Status: DC | PRN
  Administered 2020-04-10: 60 mg via INTRAVENOUS

## 2020-04-10 MED ORDER — EPHEDRINE 5 MG/ML INJ
INTRAVENOUS | Status: AC
  Filled 2020-04-10: qty 20

## 2020-04-10 MED ORDER — CHLORHEXIDINE GLUCONATE 0.12 % MT SOLN
15.0000 mL | Freq: Once | OROMUCOSAL | Status: AC
  Administered 2020-04-10: 15 mL via OROMUCOSAL

## 2020-04-10 MED ORDER — BUPIVACAINE LIPOSOME 1.3 % IJ SUSP
INTRAMUSCULAR | Status: DC | PRN
  Administered 2020-04-10: 20 mL

## 2020-04-10 MED ORDER — PHENYLEPHRINE 40 MCG/ML (10ML) SYRINGE FOR IV PUSH (FOR BLOOD PRESSURE SUPPORT)
PREFILLED_SYRINGE | INTRAVENOUS | Status: DC | PRN
  Administered 2020-04-10 (×6): 80 ug via INTRAVENOUS

## 2020-04-10 MED ORDER — LACTATED RINGERS IV SOLN: INTRAVENOUS | Status: DC

## 2020-04-10 MED ORDER — GLYCOPYRROLATE PF 0.2 MG/ML IJ SOSY
PREFILLED_SYRINGE | INTRAMUSCULAR | Status: AC
  Filled 2020-04-10: qty 1

## 2020-04-10 MED ORDER — GLYCOPYRROLATE 0.2 MG/ML IJ SOLN
INTRAMUSCULAR | Status: DC | PRN
  Administered 2020-04-10: .2 mg via INTRAVENOUS

## 2020-04-10 MED ORDER — ONDANSETRON HCL 4 MG PO TABS: 4.0000 mg | ORAL_TABLET | Freq: Three times a day (TID) | ORAL | 1 refills | Status: DC | PRN

## 2020-04-10 MED ORDER — ONDANSETRON HCL 4 MG/2ML IJ SOLN
INTRAMUSCULAR | Status: DC | PRN
  Administered 2020-04-10: 4 mg via INTRAVENOUS

## 2020-04-10 MED ORDER — KETOROLAC TROMETHAMINE 30 MG/ML IJ SOLN
INTRAMUSCULAR | Status: DC | PRN
  Administered 2020-04-10: 30 mg via INTRAVENOUS

## 2020-04-10 MED ORDER — ORAL CARE MOUTH RINSE: 15.0000 mL | Freq: Once | OROMUCOSAL | Status: AC

## 2020-04-10 SURGICAL SUPPLY — 46 items
ADH SKN CLS APL DERMABOND .7 (GAUZE/BANDAGES/DRESSINGS) ×1
CLOTH BEACON ORANGE TIMEOUT ST (SAFETY) ×2 IMPLANT
COVER LIGHT HANDLE STERIS (MISCELLANEOUS) ×4 IMPLANT
COVER WAND RF STERILE (DRAPES) ×2 IMPLANT
DERMABOND ADVANCED (GAUZE/BANDAGES/DRESSINGS) ×1
DERMABOND ADVANCED .7 DNX12 (GAUZE/BANDAGES/DRESSINGS) ×1 IMPLANT
DRAIN PENROSE 0.5X18 (DRAIN) ×2 IMPLANT
ELECT REM PT RETURN 9FT ADLT (ELECTROSURGICAL) ×2
ELECTRODE REM PT RTRN 9FT ADLT (ELECTROSURGICAL) ×1 IMPLANT
GAUZE 4X4 16PLY RFD (DISPOSABLE) ×1 IMPLANT
GAUZE SPONGE 4X4 12PLY STRL (GAUZE/BANDAGES/DRESSINGS) ×2 IMPLANT
GLOVE BIO SURGEON STRL SZ 6.5 (GLOVE) ×2 IMPLANT
GLOVE BIO SURGEON STRL SZ7 (GLOVE) ×4 IMPLANT
GLOVE BIOGEL PI IND STRL 6.5 (GLOVE) ×1 IMPLANT
GLOVE BIOGEL PI IND STRL 7.0 (GLOVE) ×3 IMPLANT
GLOVE BIOGEL PI IND STRL 7.5 (GLOVE) IMPLANT
GLOVE BIOGEL PI INDICATOR 6.5 (GLOVE) ×1
GLOVE BIOGEL PI INDICATOR 7.0 (GLOVE) ×4
GLOVE BIOGEL PI INDICATOR 7.5 (GLOVE) ×1
GOWN STRL REUS W/TWL LRG LVL3 (GOWN DISPOSABLE) ×7 IMPLANT
GOWN STRL REUS W/TWL XL LVL3 (GOWN DISPOSABLE) ×1 IMPLANT
INST SET MINOR GENERAL (KITS) ×2 IMPLANT
KIT TURNOVER KIT A (KITS) ×2 IMPLANT
MANIFOLD NEPTUNE II (INSTRUMENTS) ×2 IMPLANT
MESH HERNIA 1.6X1.9 PLUG LRG (Mesh General) IMPLANT
MESH HERNIA PLUG LRG (Mesh General) ×2 IMPLANT
NDL HYPO 18GX1.5 BLUNT FILL (NEEDLE) ×1 IMPLANT
NEEDLE HYPO 18GX1.5 BLUNT FILL (NEEDLE) ×2 IMPLANT
NEEDLE HYPO 22GX1.5 SAFETY (NEEDLE) ×2 IMPLANT
NS IRRIG 1000ML POUR BTL (IV SOLUTION) ×2 IMPLANT
PACK MINOR (CUSTOM PROCEDURE TRAY) ×2 IMPLANT
PAD ARMBOARD 7.5X6 YLW CONV (MISCELLANEOUS) ×2 IMPLANT
PENCIL SMOKE EVACUATOR (MISCELLANEOUS) ×2 IMPLANT
SET BASIN LINEN APH (SET/KITS/TRAYS/PACK) ×2 IMPLANT
SOL PREP PROV IODINE SCRUB 4OZ (MISCELLANEOUS) ×2 IMPLANT
SUT MNCRL AB 4-0 PS2 18 (SUTURE) ×2 IMPLANT
SUT NOVA NAB GS-22 2 2-0 T-19 (SUTURE) ×4 IMPLANT
SUT SILK 3 0 (SUTURE)
SUT SILK 3-0 18XBRD TIE 12 (SUTURE) IMPLANT
SUT VIC AB 2-0 CT1 27 (SUTURE) ×2
SUT VIC AB 2-0 CT1 TAPERPNT 27 (SUTURE) ×1 IMPLANT
SUT VIC AB 3-0 SH 27 (SUTURE) ×2
SUT VIC AB 3-0 SH 27X BRD (SUTURE) ×1 IMPLANT
SUT VICRYL AB 3 0 TIES (SUTURE) IMPLANT
SYR 20ML LL LF (SYRINGE) ×2 IMPLANT
SYR 30ML LL (SYRINGE) ×2 IMPLANT

## 2020-04-10 NOTE — Op Note (Signed)
[facility] Operative Note  04/10/20  Preoperative Diagnosis: Right inguinal hernia    Postoperative Diagnosis: Right inguinal hernia, cord lipoma, small noncommunicating hydrocele    Procedure(s) Performed: Right inguinal hernia repair with mesh, excision of cord lipoma    Surgeon: Lanell Matar. Constance Haw, MD   Assistants: No qualified resident was available   Anesthesia: General endotracheal   Anesthesiologist: Dr. Briant Cedar, MD    Specimens:  None    Estimated Blood Loss: Minimal   Blood Replacement: None    Complications: None   Wound Class: Clean   Operative Indications: Mr. Jeffrey Gordon is a 64 yo who has had a right inguinal hernia for years and this has been getting larger and going into the scrotum. We discussed the risk of hernia repair including bleeding, infection, mesh use, risk of recurrence, injury to the cord structures or nerve injury, and he has opted to proceed.   Findings: Large indirect hernia with cord lipoma, noncommunicating small hydrocele of right testicle    Procedure: The patient was taken to the operating room and placed supine. General endotracheal anesthesia was induced. Intravenous antibiotics were administered per protocol.  A time out was preformed verifying the correct patient, procedure, site, positioning and implants.  The right groin and scrotum were prepared and draped in the usual sterile fashion.   An incision was made in a natural skin crease between the pubic tubercle and the anterior superior iliac spine.  The incision was deepened with electrocautery through the subcutaneous tissue without obviously seeing Scarpa's and Camper's fascia. The aponeurosis of the external oblique was encountered but was so attenuated that I actually did not initially appreciate it and incised it with cautery.  After realizing this, the external oblique was cleared from the conjoint tendon area and the remaining aponeurosis was incised to the external  ring.  The ilioinguinal nerve was identified and was protected throughout the dissection.  The flaps of the external oblique were further developed cephalad and inferiorly.    The cord was identified and it was gently dissented free at the pubic tubercle and encircled with a Penrose drain.  There was a large hernia sac and the testicle was pulled into the field.  At first I thought we might have a communicating hydrocele . Attention was then directed at the anteromedial aspect of the cord, where an indirect hernia sac was identified.  The sac was carefully dissected free from the cord down to the level of the internal ring.  The vas and testicular vessels were identified and protected from harm.  Once the sac was dissected free from the cords, the Penrose was placed around the cord which was retracted inferiorly out of the field of view. The sac was then determined to not be communicating with the tunica vaginalis, so there was a small noncommunicating hydrocele that was noted.  The testicle was pulled back into the scrotum, and the hernia was reduced into the internal ring without difficulty but given the size of the hernia and the laxity of the ring, I needed 2 plugs.  Two Large Perfix Plug was placed into the defect and filled the space.  I secured those to the ring and together with 2-0 Novafil suture.  Attention was then turned to the floor of the canal, which was grossly weakened without any defined defect or sac.  The Perfix Mesh Patch was sutured to the inguinal ligament inferiorly starting at the pubic tubercle using 2-0 Novafil interrupted sutures.  The mesh was sutured  superiorly to the conjoint tendon using 2-0 Novafil interrupted sutures.  Care was taken to ensure the mesh was placed in a relaxed fashion to avoid excessive tension and no neurovascular structures were caught in the repair.  Laterally the tails of the mesh were crossed and the internal ring was recreated, allowing for passage of cords  without tension.   Hemostasis was adequate.  The Penrose was removed.  The external oblique aponeurosis was closed with a 2-0 Vicryl suture in a running fashion, taking care to not catch the ilioinguinal nerve in the suture line.  Scarpa's fashion was closed with 3-0 Vicryl interrupted sutures. The skin was closed with a subcuticular 4-0 Monocryl suture.  Dermabond was applied.   The testis was gently pulled down into its anatomic position in the scrotum.  The patient tolerated the procedure well and was taken to the PACU in stable condition. All counts were correct at the end of the case.       Curlene Labrum, MD [facility] [address] [place], [place] [address] [phone] (office)

## 2020-04-10 NOTE — Addendum Note (Signed)
Addendum  created 04/10/20 1222 by Lieutenant Diego, CRNA   Charge Capture section accepted

## 2020-04-10 NOTE — Anesthesia Procedure Notes (Signed)
Procedure Name: LMA Insertion Date/Time: 04/10/2020 7:35 AM Performed by: Lieutenant Diego, CRNA Pre-anesthesia Checklist: Patient identified, Emergency Drugs available, Suction available and Patient being monitored Patient Re-evaluated:Patient Re-evaluated prior to induction Oxygen Delivery Method: Circle system utilized Preoxygenation: Pre-oxygenation with 100% oxygen Induction Type: IV induction Ventilation: Mask ventilation without difficulty LMA: LMA inserted LMA Size: 5.0 Number of attempts: 1 Airway Equipment and Method: Bite block Placement Confirmation: positive ETCO2 and breath sounds checked- equal and bilateral Tube secured with: Tape Dental Injury: Teeth and Oropharynx as per pre-operative assessment

## 2020-04-10 NOTE — Interval H&P Note (Signed)
History and Physical Interval Note:  04/10/2020 7:21 AM  Jeffrey Gordon  has presented today for surgery, with the diagnosis of Right Inguial hernia.  The various methods of treatment have been discussed with the patient and family. After consideration of risks, benefits and other options for treatment, the patient has consented to  Procedure(s): HERNIA REPAIR INGUINAL ADULT (Right) as a surgical intervention.  The patient's history has been reviewed, patient examined, no change in status, stable for surgery.  I have reviewed the patient's chart and labs.  Questions were answered to the patient's satisfaction.    Marked patient. Questions answered.  Virl Cagey

## 2020-04-10 NOTE — Transfer of Care (Signed)
Immediate Anesthesia Transfer of Care Note  Patient: Jeffrey Gordon  Procedure(s) Performed: HERNIA REPAIR INGUINAL ADULT (Right Inguinal)  Patient Location: PACU  Anesthesia Type:General  Level of Consciousness: awake  Airway & Oxygen Therapy: Patient Spontanous Breathing and Patient connected to face mask oxygen  Post-op Assessment: Report given to RN and Post -op Vital signs reviewed and stable  Post vital signs: Reviewed and stable  Last Vitals:  Vitals Value Taken Time  BP 138/84 04/10/20 0948  Temp    Pulse 79 04/10/20 0950  Resp 15 04/10/20 0950  SpO2 89 % 04/10/20 0950  Vitals shown include unvalidated device data.  Last Pain:  Vitals:   04/10/20 0701  TempSrc: Oral  PainSc: 0-No pain      Patients Stated Pain Goal: 5 (64/15/83 0940)  Complications: No complications documented.

## 2020-04-10 NOTE — Anesthesia Preprocedure Evaluation (Signed)
Anesthesia Evaluation  Patient identified by MRN, date of birth, ID band Patient awake    Reviewed: Allergy & Precautions, H&P , NPO status , Patient's Chart, lab work & pertinent test results, reviewed documented beta blocker date and time   Airway Mallampati: II  TM Distance: >3 FB Neck ROM: full    Dental no notable dental hx.    Pulmonary COPD,  COPD inhaler,    Pulmonary exam normal breath sounds clear to auscultation       Cardiovascular Exercise Tolerance: Good hypertension, negative cardio ROS   Rhythm:regular Rate:Normal     Neuro/Psych PSYCHIATRIC DISORDERS Anxiety Depression negative neurological ROS     GI/Hepatic negative GI ROS, Neg liver ROS, GERD  ,  Endo/Other  negative endocrine ROSHypothyroidism   Renal/GU negative Renal ROS  negative genitourinary   Musculoskeletal   Abdominal   Peds  Hematology negative hematology ROS (+)   Anesthesia Other Findings   Reproductive/Obstetrics negative OB ROS                             Anesthesia Physical Anesthesia Plan  ASA: III  Anesthesia Plan: General   Post-op Pain Management:    Induction:   PONV Risk Score and Plan: 2 and Ondansetron  Airway Management Planned:   Additional Equipment:   Intra-op Plan:   Post-operative Plan:   Informed Consent: I have reviewed the patients History and Physical, chart, labs and discussed the procedure including the risks, benefits and alternatives for the proposed anesthesia with the patient or authorized representative who has indicated his/her understanding and acceptance.     Dental Advisory Given  Plan Discussed with: CRNA  Anesthesia Plan Comments:         Anesthesia Quick Evaluation

## 2020-04-10 NOTE — Discharge Instructions (Signed)
Discharge Instructions Hernia:  Common Complaints: Pain at the incision site is common. This will improve with time. Take your pain medications as described below. Some nausea is common and poor appetite. The main goal is to stay hydrated the first few days after surgery.  Numbness at the incision or the thigh is common.  You may have extensive swelling and bruising in your groin and scrotum, elevate the scrotum with towels and keep ice on the area for the first 48 hours.  If you start to have burning or tingling pain in your groin, this is from a nerve being pinched. Please call and we can prescribe you a different type of pain medication for nerve pain.   Diet/ Activity: Diet as tolerated. You may not have an appetite, but it is important to stay hydrated. Drink 64 ounces of water a day. Your appetite will return with time.  Shower per your regular routine daily.  Do not take hot showers. Take warm showers that are less than 10 minutes. Rest and listen to your body, but do not remain in bed all day.  Walk everyday for at least 15-20 minutes. Deep cough and move around every 1-2 hours in the first few days after surgery.  Do not pick at the dermabond glue on your incision sites.  This glue film will remain in place for 1-2 weeks and will start to peel off.  Do not place lotions or balms on your incision unless instructed to specifically by Dr. Constance Haw.  Do not lift > 10 lbs, perform excessive bending, pushing, pulling, squatting for 6-8 weeks after surgery.   Pain Expectations and Narcotics: -After surgery you will have pain associated with your incisions and this is normal. The pain is muscular and nerve pain, and will get better with time. -You are encouraged and expected to take non narcotic medications like tylenol and ibuprofen (when able) to treat pain as multiple modalities can aid with pain treatment. -Narcotics are only used when pain is severe or there is breakthrough pain. -You are  not expected to have a pain score of 0 after surgery, as we cannot prevent pain. A pain score of 3-4 that allows you to be functional, move, walk, and tolerate some activity is the goal. The pain will continue to improve over the days after surgery and is dependent on your surgery. -Due to [place] law, we are only able to give a certain amount of pain medication to treat post operative pain, and we only give additional narcotics on a patient by patient basis.  -For most laparoscopic surgery, studies have shown that the majority of patients only need 10-15 narcotic pills, and for open surgeries most patients only need 15-20.   -Having appropriate expectations of pain and knowledge of pain management with non narcotics is important as we do not want anyone to become addicted to narcotic pain medication.  -Using ice packs in the first 48 hours and heating pads after 48 hours, wearing an abdominal binder (when recommended), and using over the counter medications are all ways to help with pain management.   -Simple acts like meditation and mindfulness practices after surgery can also help with pain control and research has proven the benefit of these practices.  Medication: Take tylenol and ibuprofen as needed for pain control, alternating every 4-6 hours.  Example:  Tylenol 1000mg  @ 6am, 12noon, 6pm, 85midnight (Do not exceed 4000mg  of tylenol a day). Ibuprofen 800mg  @ 9am, 3pm, 9pm, 3am (Do not exceed 3600mg   of ibuprofen a day).  Take Roxicodone for breakthrough pain every 4 hours.  Take Colace for constipation related to narcotic pain medication. If you do not have a bowel movement in 2 days, take Miralax over the counter.  Drink plenty of water to also prevent constipation.   Contact Information: If you have questions or concerns, please call our office, [phone], Monday- Thursday 8AM-5PM and Friday 8AM-12Noon.  If it is after hours or on the weekend, please call [facility], [phone], and  ask to speak to the surgeon on call for Dr. Constance Haw at [facility].    Open Hernia Repair, Adult, Care After These instructions give you information about caring for yourself after your procedure. Your doctor may also give you more specific instructions. If you have problems or questions, contact your doctor. Follow these instructions at home: Surgical cut (incision) care   Follow instructions from your doctor about how to take care of your surgical cut area. Make sure you: ? Wash your hands with soap and water before you change your bandage (dressing). If you cannot use soap and water, use hand sanitizer. ? Change your bandage as told by your doctor. ? Leave stitches (sutures), skin glue, or skin tape (adhesive) strips in place. They may need to stay in place for 2 weeks or longer. If tape strips get loose and curl up, you may trim the loose edges. Do not remove tape strips completely unless your doctor says it is okay.  Check your surgical cut every day for signs of infection. Check for: ? More redness, swelling, or pain. ? More fluid or blood. ? Warmth. ? Pus or a bad smell. Activity  Do not drive or use heavy machinery while taking prescription pain medicine. Do not drive until your doctor says it is okay.  Until your doctor says it is okay: ? Do not lift anything that is heavier than 10 lb (4.5 kg). ? Do not play contact sports.  Return to your normal activities as told by your doctor. Ask your doctor what activities are safe. General instructions  To prevent or treat having a hard time pooping (constipation) while you are taking prescription pain medicine, your doctor may recommend that you: ? Drink enough fluid to keep your pee (urine) clear or pale yellow. ? Take over-the-counter or prescription medicines. ? Eat foods that are high in fiber, such as fresh fruits and vegetables, whole grains, and beans. ? Limit foods that are high in fat and processed sugars, such as fried and  sweet foods.  Take over-the-counter and prescription medicines only as told by your doctor.  Do not take baths, swim, or use a hot tub until your doctor says it is okay.  Keep all follow-up visits as told by your doctor. This is important. Contact a doctor if:  You develop a rash.  You have more redness, swelling, or pain around your surgical cut.  You have more fluid or blood coming from your surgical cut.  Your surgical cut feels warm to the touch.  You have pus or a bad smell coming from your surgical cut.  You have a fever or chills.  You have blood in your poop (stool).  You have not pooped in 2-3 days.  Medicine does not help your pain. Get help right away if:  You have chest pain or you are short of breath.  You feel light-headed.  You feel weak and dizzy (feel faint).  You have very bad pain.  You  throw up (vomit) and your pain is worse. This information is not intended to replace advice given to you by your health care provider. Make sure you discuss any questions you have with your health care provider. Document Revised: 11/23/2018 Document Reviewed: 01/13/2016 Elsevier Patient Education  [place].

## 2020-04-10 NOTE — Progress Notes (Signed)
[facility]  Updated his wife that surgery completed, large hernia. Will need to ice area and elevate scrotum. Will need to urinate before discharge.  Rx sent to [facility] Will see in a few weeks. No heavy lifting > 10 lbs, excessive bending, pushing, pulling, or squatting for 6-8 weeks after surgery.    Curlene Labrum, MD [facility] [address] [place], [place] [address] [phone] (office)

## 2020-04-10 NOTE — Anesthesia Postprocedure Evaluation (Signed)
Anesthesia Post Note  Patient: Jeffrey Gordon  Procedure(s) Performed: HERNIA REPAIR INGUINAL ADULT (Right Inguinal)  Patient location during evaluation: PACU Anesthesia Type: General Level of consciousness: awake Pain management: pain level controlled Vital Signs Assessment: post-procedure vital signs reviewed and stable Respiratory status: spontaneous breathing Cardiovascular status: blood pressure returned to baseline Postop Assessment: no headache Anesthetic complications: no   No complications documented.   Last Vitals:  Vitals:   04/10/20 1115 04/10/20 1127  BP: (!) 134/95 117/88  Pulse: 77 85  Resp: 16 18  Temp:  36.6 C  SpO2: 94% 93%    Last Pain:  Vitals:   04/10/20 1127  TempSrc: Oral  PainSc: [place]

## 2020-04-13 ENCOUNTER — Other Ambulatory Visit: Payer: Self-pay

## 2020-04-13 ENCOUNTER — Emergency Department (HOSPITAL_COMMUNITY): Payer: Medicare Other

## 2020-04-13 ENCOUNTER — Telehealth: Payer: Self-pay | Admitting: Family Medicine

## 2020-04-13 ENCOUNTER — Encounter (HOSPITAL_COMMUNITY): Payer: Self-pay

## 2020-04-13 ENCOUNTER — Inpatient Hospital Stay (HOSPITAL_COMMUNITY)
Admission: EM | Admit: 2020-04-13 | Discharge: 2020-04-17 | DRG: 351 | Disposition: A | Discharge status: Home Health | Payer: Medicare Other | Attending: Family Medicine | Admitting: Family Medicine

## 2020-04-13 ENCOUNTER — Inpatient Hospital Stay (HOSPITAL_COMMUNITY): Payer: Medicare Other

## 2020-04-13 DIAGNOSIS — J449 Chronic obstructive pulmonary disease, unspecified: Secondary | ICD-10-CM | POA: Diagnosis present

## 2020-04-13 DIAGNOSIS — E039 Hypothyroidism, unspecified: Secondary | ICD-10-CM | POA: Diagnosis not present

## 2020-04-13 DIAGNOSIS — Z79890 Hormone replacement therapy (postmenopausal): Secondary | ICD-10-CM | POA: Diagnosis not present

## 2020-04-13 DIAGNOSIS — J309 Allergic rhinitis, unspecified: Secondary | ICD-10-CM | POA: Diagnosis present

## 2020-04-13 DIAGNOSIS — F329 Major depressive disorder, single episode, unspecified: Secondary | ICD-10-CM | POA: Diagnosis present

## 2020-04-13 DIAGNOSIS — K589 Irritable bowel syndrome without diarrhea: Secondary | ICD-10-CM | POA: Diagnosis present

## 2020-04-13 DIAGNOSIS — R112 Nausea with vomiting, unspecified: Secondary | ICD-10-CM | POA: Diagnosis not present

## 2020-04-13 DIAGNOSIS — Z20822 Contact with and (suspected) exposure to covid-19: Secondary | ICD-10-CM | POA: Diagnosis present

## 2020-04-13 DIAGNOSIS — E860 Dehydration: Secondary | ICD-10-CM | POA: Diagnosis present

## 2020-04-13 DIAGNOSIS — E871 Hypo-osmolality and hyponatremia: Secondary | ICD-10-CM | POA: Diagnosis not present

## 2020-04-13 DIAGNOSIS — Z79899 Other long term (current) drug therapy: Secondary | ICD-10-CM | POA: Diagnosis not present

## 2020-04-13 DIAGNOSIS — K219 Gastro-esophageal reflux disease without esophagitis: Secondary | ICD-10-CM | POA: Diagnosis present

## 2020-04-13 DIAGNOSIS — E878 Other disorders of electrolyte and fluid balance, not elsewhere classified: Secondary | ICD-10-CM | POA: Diagnosis present

## 2020-04-13 DIAGNOSIS — I700 Atherosclerosis of aorta: Secondary | ICD-10-CM | POA: Diagnosis not present

## 2020-04-13 DIAGNOSIS — Z888 Allergy status to other drugs, medicaments and biological substances status: Secondary | ICD-10-CM | POA: Diagnosis not present

## 2020-04-13 DIAGNOSIS — R531 Weakness: Secondary | ICD-10-CM | POA: Diagnosis not present

## 2020-04-13 DIAGNOSIS — E785 Hyperlipidemia, unspecified: Secondary | ICD-10-CM | POA: Diagnosis present

## 2020-04-13 DIAGNOSIS — N400 Enlarged prostate without lower urinary tract symptoms: Secondary | ICD-10-CM | POA: Diagnosis present

## 2020-04-13 DIAGNOSIS — L7622 Postprocedural hemorrhage and hematoma of skin and subcutaneous tissue following other procedure: Secondary | ICD-10-CM | POA: Diagnosis not present

## 2020-04-13 DIAGNOSIS — N433 Hydrocele, unspecified: Secondary | ICD-10-CM | POA: Diagnosis present

## 2020-04-13 DIAGNOSIS — D176 Benign lipomatous neoplasm of spermatic cord: Secondary | ICD-10-CM | POA: Diagnosis present

## 2020-04-13 DIAGNOSIS — Z7982 Long term (current) use of aspirin: Secondary | ICD-10-CM | POA: Diagnosis not present

## 2020-04-13 DIAGNOSIS — F32A Depression, unspecified: Secondary | ICD-10-CM | POA: Diagnosis present

## 2020-04-13 DIAGNOSIS — D62 Acute posthemorrhagic anemia: Secondary | ICD-10-CM

## 2020-04-13 DIAGNOSIS — I10 Essential (primary) hypertension: Secondary | ICD-10-CM | POA: Diagnosis present

## 2020-04-13 DIAGNOSIS — J90 Pleural effusion, not elsewhere classified: Secondary | ICD-10-CM | POA: Diagnosis not present

## 2020-04-13 DIAGNOSIS — Z7951 Long term (current) use of inhaled steroids: Secondary | ICD-10-CM

## 2020-04-13 DIAGNOSIS — K429 Umbilical hernia without obstruction or gangrene: Secondary | ICD-10-CM | POA: Diagnosis not present

## 2020-04-13 DIAGNOSIS — F419 Anxiety disorder, unspecified: Secondary | ICD-10-CM | POA: Diagnosis present

## 2020-04-13 DIAGNOSIS — K4090 Unilateral inguinal hernia, without obstruction or gangrene, not specified as recurrent: Secondary | ICD-10-CM | POA: Diagnosis not present

## 2020-04-13 DIAGNOSIS — R109 Unspecified abdominal pain: Secondary | ICD-10-CM | POA: Diagnosis not present

## 2020-04-13 DIAGNOSIS — R42 Dizziness and giddiness: Secondary | ICD-10-CM | POA: Diagnosis not present

## 2020-04-13 LAB — IRON AND TIBC
Iron: 27 ug/dL — ABNORMAL LOW (ref 45–182)
Saturation Ratios: 10 % — ABNORMAL LOW (ref 17.9–39.5)
TIBC: 262 ug/dL (ref 250–450)
UIBC: 235 ug/dL

## 2020-04-13 LAB — CBC WITH DIFFERENTIAL/PLATELET
Abs Immature Granulocytes: 0.27 K/uL — ABNORMAL HIGH (ref 0.00–0.07)
Basophils Absolute: 0 K/uL (ref 0.0–0.1)
Basophils Relative: 0 %
Eosinophils Absolute: 0 K/uL (ref 0.0–0.5)
Eosinophils Relative: 0 %
HCT: 23.8 % — ABNORMAL LOW (ref 39.0–52.0)
Hemoglobin: 8.5 g/dL — ABNORMAL LOW (ref 13.0–17.0)
Immature Granulocytes: 2 %
Lymphocytes Relative: 7 %
Lymphs Abs: 0.9 K/uL (ref 0.7–4.0)
MCH: 30.2 pg (ref 26.0–34.0)
MCHC: 35.7 g/dL (ref 30.0–36.0)
MCV: 84.7 fL (ref 80.0–100.0)
Monocytes Absolute: 1.7 K/uL — ABNORMAL HIGH (ref 0.1–1.0)
Monocytes Relative: 12 %
Neutro Abs: 10.4 K/uL — ABNORMAL HIGH (ref 1.7–7.7)
Neutrophils Relative %: 79 %
Platelets: 194 K/uL (ref 150–400)
RBC: 2.81 MIL/uL — ABNORMAL LOW (ref 4.22–5.81)
RDW: 13.5 % (ref 11.5–15.5)
WBC: 13.4 K/uL — ABNORMAL HIGH (ref 4.0–10.5)
nRBC: 0 % (ref 0.0–0.2)

## 2020-04-13 LAB — BASIC METABOLIC PANEL WITH GFR
Anion gap: 8 (ref 5–15)
Anion gap: 9 (ref 5–15)
BUN: 9 mg/dL (ref 8–23)
BUN: 9 mg/dL (ref 8–23)
CO2: 21 mmol/L — ABNORMAL LOW (ref 22–32)
CO2: 21 mmol/L — ABNORMAL LOW (ref 22–32)
Calcium: 7.7 mg/dL — ABNORMAL LOW (ref 8.9–10.3)
Calcium: 7.8 mg/dL — ABNORMAL LOW (ref 8.9–10.3)
Chloride: 81 mmol/L — ABNORMAL LOW (ref 98–111)
Chloride: 82 mmol/L — ABNORMAL LOW (ref 98–111)
Creatinine, Ser: 0.66 mg/dL (ref 0.61–1.24)
Creatinine, Ser: 0.66 mg/dL (ref 0.61–1.24)
GFR calc Af Amer: >60 mL/min
GFR calc Af Amer: >60 mL/min
GFR calc non Af Amer: >60 mL/min
GFR calc non Af Amer: >60 mL/min
Glucose, Bld: 104 mg/dL — ABNORMAL HIGH (ref 70–99)
Glucose, Bld: 102 mg/dL — ABNORMAL HIGH (ref 70–99)
Potassium: 3.8 mmol/L (ref 3.5–5.1)
Potassium: 3.6 mmol/L (ref 3.5–5.1)
Sodium: 110 mmol/L — CL (ref 135–145)
Sodium: 112 mmol/L — CL (ref 135–145)

## 2020-04-13 LAB — HEMOGLOBIN AND HEMATOCRIT, BLOOD
HCT: 23.4 % — ABNORMAL LOW (ref 39.0–52.0)
HCT: 23.5 % — ABNORMAL LOW (ref 39.0–52.0)
Hemoglobin: 8.1 g/dL — ABNORMAL LOW (ref 13.0–17.0)
Hemoglobin: 8.4 g/dL — ABNORMAL LOW (ref 13.0–17.0)

## 2020-04-13 LAB — BASIC METABOLIC PANEL
Anion gap: 9 (ref 5–15)
Anion gap: 11 (ref 5–15)
BUN: 9 mg/dL (ref 8–23)
BUN: 8 mg/dL (ref 8–23)
CO2: 20 mmol/L — ABNORMAL LOW (ref 22–32)
CO2: 20 mmol/L — ABNORMAL LOW (ref 22–32)
Calcium: 7.9 mg/dL — ABNORMAL LOW (ref 8.9–10.3)
Calcium: 7.9 mg/dL — ABNORMAL LOW (ref 8.9–10.3)
Chloride: 84 mmol/L — ABNORMAL LOW (ref 98–111)
Chloride: 81 mmol/L — ABNORMAL LOW (ref 98–111)
Creatinine, Ser: 0.65 mg/dL (ref 0.61–1.24)
Creatinine, Ser: 0.7 mg/dL (ref 0.61–1.24)
GFR calc Af Amer: >60 mL/min (ref >60)
GFR calc Af Amer: >60 mL/min (ref >60)
GFR calc non Af Amer: >60 mL/min (ref >60)
GFR calc non Af Amer: >60 mL/min (ref >60)
Glucose, Bld: 116 mg/dL — ABNORMAL HIGH (ref 70–99)
Glucose, Bld: 104 mg/dL — ABNORMAL HIGH (ref 70–99)
Potassium: 3.6 mmol/L (ref 3.5–5.1)
Potassium: 3.4 mmol/L — ABNORMAL LOW (ref 3.5–5.1)
Sodium: 113 mmol/L — CL (ref 135–145)
Sodium: 112 mmol/L — CL (ref 135–145)

## 2020-04-13 LAB — I-STAT CHEM 8, ED
BUN: 8 mg/dL (ref 8–23)
Calcium, Ion: 1.09 mmol/L — ABNORMAL LOW (ref 1.15–1.40)
Chloride: 78 mmol/L — ABNORMAL LOW (ref 98–111)
Creatinine, Ser: 0.7 mg/dL (ref 0.61–1.24)
Glucose, Bld: 114 mg/dL — ABNORMAL HIGH (ref 70–99)
HCT: 22 % — ABNORMAL LOW (ref 39.0–52.0)
Hemoglobin: 7.5 g/dL — ABNORMAL LOW (ref 13.0–17.0)
Potassium: 3.8 mmol/L (ref 3.5–5.1)
Sodium: 111 mmol/L — CL (ref 135–145)
TCO2: 25 mmol/L (ref 22–32)

## 2020-04-13 LAB — COMPREHENSIVE METABOLIC PANEL
ALT: 21 U/L (ref 0–44)
AST: 34 U/L (ref 15–41)
Albumin: 3.5 g/dL (ref 3.5–5.0)
Alkaline Phosphatase: 25 U/L — ABNORMAL LOW (ref 38–126)
Anion gap: 9 (ref 5–15)
BUN: 10 mg/dL (ref 8–23)
CO2: 21 mmol/L — ABNORMAL LOW (ref 22–32)
Calcium: 8 mg/dL — ABNORMAL LOW (ref 8.9–10.3)
Chloride: 79 mmol/L — ABNORMAL LOW (ref 98–111)
Creatinine, Ser: 0.7 mg/dL (ref 0.61–1.24)
GFR calc Af Amer: >60 mL/min (ref >60)
GFR calc non Af Amer: >60 mL/min (ref >60)
Glucose, Bld: 114 mg/dL — ABNORMAL HIGH (ref 70–99)
Potassium: 3.8 mmol/L (ref 3.5–5.1)
Sodium: 109 mmol/L — CL (ref 135–145)
Total Bilirubin: 1.3 mg/dL — ABNORMAL HIGH (ref 0.3–1.2)
Total Protein: 5.7 g/dL — ABNORMAL LOW (ref 6.5–8.1)

## 2020-04-13 LAB — URINALYSIS, ROUTINE W REFLEX MICROSCOPIC
Bacteria, UA: NONE SEEN
Bilirubin Urine: NEGATIVE
Glucose, UA: NEGATIVE mg/dL
Hgb urine dipstick: NEGATIVE
Ketones, ur: 20 mg/dL — AB
Leukocytes,Ua: NEGATIVE
Nitrite: NEGATIVE
Protein, ur: 30 mg/dL — AB
Specific Gravity, Urine: 1.024 (ref 1.005–1.030)
pH: 6 (ref 5.0–8.0)

## 2020-04-13 LAB — RETICULOCYTES
Immature Retic Fract: 17.9 % — ABNORMAL HIGH (ref 2.3–15.9)
RBC.: 2.65 MIL/uL — ABNORMAL LOW (ref 4.22–5.81)
Retic Count, Absolute: 62 10*3/uL (ref 19.0–186.0)
Retic Ct Pct: 2.3 % (ref 0.4–3.1)

## 2020-04-13 LAB — VITAMIN B12: Vitamin B-12: 279 pg/mL (ref 180–914)

## 2020-04-13 LAB — TSH: TSH: 1.247 u[IU]/mL (ref 0.350–4.500)

## 2020-04-13 LAB — CORTISOL: Cortisol, Plasma: 20.2 ug/dL

## 2020-04-13 LAB — OSMOLALITY: Osmolality: 233 mosm/kg — CL (ref 275–295)

## 2020-04-13 LAB — HIV ANTIBODY (ROUTINE TESTING W REFLEX): HIV Screen 4th Generation wRfx: NONREACTIVE

## 2020-04-13 LAB — FERRITIN: Ferritin: 181 ng/mL (ref 24–336)

## 2020-04-13 LAB — SARS CORONAVIRUS 2 BY RT PCR (HOSPITAL ORDER, PERFORMED IN ~~LOC~~ HOSPITAL LAB): SARS Coronavirus 2: NEGATIVE

## 2020-04-13 LAB — OSMOLALITY, URINE: Osmolality, Ur: 463 mosm/kg (ref 300–900)

## 2020-04-13 LAB — SODIUM, URINE, RANDOM: Sodium, Ur: 11 mmol/L

## 2020-04-13 LAB — LACTIC ACID, PLASMA: Lactic Acid, Venous: 0.8 mmol/L (ref 0.5–1.9)

## 2020-04-13 LAB — FOLATE: Folate: 12.7 ng/mL (ref >5.9)

## 2020-04-13 MED ORDER — ACETAMINOPHEN 650 MG RE SUPP: 650.0000 mg | Freq: Four times a day (QID) | RECTAL | Status: DC | PRN

## 2020-04-13 MED ORDER — MOMETASONE FURO-FORMOTEROL FUM 200-5 MCG/ACT IN AERO
2.0000 | INHALATION_SPRAY | Freq: Two times a day (BID) | RESPIRATORY_TRACT | Status: DC
  Administered 2020-04-14 – 2020-04-17 (×7): 2 via RESPIRATORY_TRACT
  Filled 2020-04-13: qty 8.8

## 2020-04-13 MED ORDER — SODIUM CHLORIDE 0.9 % IV SOLN: Freq: Once | INTRAVENOUS | Status: AC

## 2020-04-13 MED ORDER — IPRATROPIUM-ALBUTEROL 0.5-2.5 (3) MG/3ML IN SOLN: 3.0000 mL | Freq: Four times a day (QID) | RESPIRATORY_TRACT | Status: DC | PRN

## 2020-04-13 MED ORDER — LEVOTHYROXINE SODIUM 88 MCG PO TABS
88.0000 ug | ORAL_TABLET | Freq: Every day | ORAL | Status: DC
  Administered 2020-04-14 – 2020-04-17 (×4): 88 ug via ORAL
  Filled 2020-04-13 (×5): qty 1

## 2020-04-13 MED ORDER — ACETAMINOPHEN 325 MG PO TABS: 650.0000 mg | ORAL_TABLET | Freq: Four times a day (QID) | ORAL | Status: DC | PRN

## 2020-04-13 MED ORDER — ONDANSETRON HCL 4 MG PO TABS
4.0000 mg | ORAL_TABLET | Freq: Four times a day (QID) | ORAL | Status: DC | PRN
  Administered 2020-04-13 – 2020-04-15 (×3): 4 mg via ORAL
  Filled 2020-04-13 (×3): qty 1

## 2020-04-13 MED ORDER — IOHEXOL 300 MG/ML  SOLN
100.0000 mL | Freq: Once | INTRAMUSCULAR | Status: AC | PRN
  Administered 2020-04-13: 100 mL via INTRAVENOUS

## 2020-04-13 MED ORDER — OXYCODONE HCL 5 MG PO TABS
5.0000 mg | ORAL_TABLET | Freq: Four times a day (QID) | ORAL | Status: DC | PRN
  Administered 2020-04-13 – 2020-04-15 (×3): 5 mg via ORAL
  Filled 2020-04-13 (×3): qty 1

## 2020-04-13 MED ORDER — LATANOPROST 0.005 % OP SOLN
1.0000 [drp] | Freq: Every day | OPHTHALMIC | Status: DC
  Administered 2020-04-14 – 2020-04-16 (×3): 1 [drp] via OPHTHALMIC
  Filled 2020-04-13 (×3): qty 2.5

## 2020-04-13 MED ORDER — ONDANSETRON HCL 4 MG/2ML IJ SOLN
4.0000 mg | Freq: Four times a day (QID) | INTRAMUSCULAR | Status: DC | PRN
  Administered 2020-04-14: 4 mg via INTRAVENOUS
  Filled 2020-04-13: qty 2

## 2020-04-13 NOTE — ED Notes (Signed)
Date and time results received: 04/13/20 2236  Test: Na Critical Value: 112  Name of Provider Notified: Phineas Inches, MD  Orders Received? Or Actions Taken?: acknowledged

## 2020-04-13 NOTE — ED Provider Notes (Signed)
[facility] EMERGENCY DEPARTMENT Provider Note   CSN: [id number] Arrival date & time: 04/13/20  4665     History Chief Complaint  Patient presents with  . Weakness    Jeffrey Gordon is a 64 y.o. male history of COPD, GERD, hypertension, high cholesterol, hypothyroidism.  Patient reports that he underwent right inguinal hernia repair on Friday, 04/10/2020.  He reports that since time of surgery he has had increasing fatigue generalized weakness nausea nonbloody/nonbilious emesis.  He reports mild lower abdominal pain unchanged since surgery, aching worse with palpation improved with rest, pain does not radiate.  He reports swelling to his groin and scrotum which has been somewhat worsened since discharge..  Patient's primary concern is his fatigue which has been gradually worsening for the past 3 days he reports he is unable to eat and has not had a bowel movement since Friday morning.  Denies fever/chills, headache, chest pain/shortness of breath, fall/injury or any additional concerns.  HPI     Past Medical History:  Diagnosis Date  . Allergy   . Anxiety   . Blurred vision, left eye   . Cataract   . COPD (chronic obstructive pulmonary disease) ([place])   . Depression   . GERD (gastroesophageal reflux disease)   . High cholesterol   . Hypertension   . Hypothyroidism     Patient Active Problem List   Diagnosis Date Noted  . Hyponatremia 04/13/2020  . Right inguinal hernia 03/17/2020  . 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 PREMATURE EJACULATION 10/21/2008  . ERECTILE DYSFUNCTION 11/06/2006  . HYPERLIPIDEMIA 07/04/2006  . DEPRESSION 07/04/2006  . HYPERTENSION 07/04/2006  . ALLERGIC RHINITIS 07/04/2006  . ESOPHAGITIS NOS 07/04/2006  . GERD 07/04/2006  . IBS 07/04/2006  . BENIGN PROSTATIC HYPERTROPHY 07/04/2006  . DYSPHAGIA, UNSPECIFIED 07/04/2006  . FRACTURE, FINGER 07/04/2006    Past Surgical History:  Procedure Laterality Date  . CATARACT EXTRACTION W/  INTRAOCULAR LENS  IMPLANT, BILATERAL     2002 (left); 2012 (right)  . COLONOSCOPY    . EYE SURGERY    . [place]   left  . INGUINAL HERNIA REPAIR Right 04/10/2020   Procedure: HERNIA REPAIR INGUINAL ADULT;  Surgeon: Virl Cagey, MD;  Location: [facility];  Service: General;  Laterality: Right;  . PARS PLANA VITRECTOMY  10/20/11   "took out the old lens implant and put a new one in"  . PARS PLANA VITRECTOMY  10/20/2011   Procedure: PARS PLANA VITRECTOMY WITH 25G REMOVAL/SUTURE INTRAOCULAR LENS;  Surgeon: Hayden Pedro, MD;  Location: [place];  Service: Ophthalmology;  Laterality: Left;  . RETINAL DETACHMENT SURGERY  2002   left  . RETINAL DETACHMENT SURGERY  2003   "repair; left"       Family History  Problem Relation Age of Onset  . Colon cancer Father   . Esophageal cancer Neg Hx   . Rectal cancer Neg Hx   . Stomach cancer Neg Hx     Social History   Tobacco Use  . Smoking status: Never Smoker  . Smokeless tobacco: Never Used  . Tobacco comment: "tried smoking briefly as a teen"  Substance Use Topics  . Alcohol use: Not Currently    Comment: 10/20/11 "last alcohol ` 2010"  . Drug use: No    Home Medications Prior to Admission medications   Medication Sig Start Date End Date Taking? Authorizing Provider  aspirin EC 81 MG tablet Take 81 mg by mouth daily.  [provider]  brimonidine-timolol (COMBIGAN) 0.2-0.5 % ophthalmic solution Place 1 drop into the right eye in the morning and at bedtime.     [provider]  CO ENZYME Q-10 PO Take 1 capsule by mouth daily.     [provider]  docusate sodium (COLACE) 100 MG capsule Take 1 capsule (100 mg total) by mouth 2 (two) times daily as needed for mild constipation (related to narcotic medication). 04/10/20 04/10/21  Virl Cagey, MD  dorzolamide (TRUSOPT) 2 % ophthalmic solution  11/18/19   [provider]  doxazosin (CARDURA) 4 MG tablet Take 4 mg by mouth at  bedtime.     [provider]  FLUoxetine (PROZAC) 40 MG capsule Take 40 mg by mouth daily.    [provider]  latanoprost (XALATAN) 0.005 % ophthalmic solution Place 1 drop into both eyes at bedtime.  02/20/20   [provider]  levothyroxine (SYNTHROID, LEVOTHROID) 88 MCG tablet Take 88 mcg by mouth daily before breakfast.     [provider]  losartan (COZAAR) 100 MG tablet Take 100 mg by mouth daily.    [provider]  Multiple Vitamins-Minerals (ICAPS AREDS 2) CAPS Take 2 capsules by mouth daily. Patient not taking: Reported on 03/31/2020    [provider]  Multiple Vitamins-Minerals (PRESERVISION AREDS 2 PO) Take 1 capsule by mouth in the morning and at bedtime.    [provider]  omeprazole (PRILOSEC) 20 MG capsule Take 20 mg by mouth daily.    [provider]  ondansetron (ZOFRAN) 4 MG tablet Take 1 tablet (4 mg total) by mouth every 8 (eight) hours as needed. 04/10/20 04/10/21  Virl Cagey, MD  oxyCODONE (ROXICODONE) 5 MG immediate release tablet Take 1 tablet (5 mg total) by mouth every 4 (four) hours as needed for severe pain or breakthrough pain. 04/10/20 04/10/21  Virl Cagey, MD  prednisoLONE acetate (PRED FORTE) 1 % ophthalmic suspension  11/19/19   [provider]  PROAIR HFA 108 (90 Base) MCG/ACT inhaler INHALE 1 TO 2 PUFFS BY MOUTH EVERY 4 TO 6 HOURS PRF SHORTNESS OF BREATH/WHEEZING Patient not taking: No sig reported 10/01/18   [provider]  rosuvastatin (CRESTOR) 10 MG tablet Take 10 mg by mouth daily.    [provider]  sildenafil (VIAGRA) 100 MG tablet Take 100 mg by mouth daily as needed. Patient not taking: Reported on 03/31/2020    [provider]  SYMBICORT 160-4.5 MCG/ACT inhaler INHALE 2 PUFFS INTO THE LUNGS TWICE DAILY 04/08/20   Mannam, Hart Robinsons, MD    Allergies    Durezol [difluprednate] and Advair diskus [fluticasone-salmeterol]  Review of Systems    Review of Systems Ten systems are reviewed and are negative for acute change except as noted in the HPI  Physical Exam Updated Vital Signs BP 134/74   Pulse 85   Temp 98.2 F (36.8 C) (Oral)   Resp (!) 21   Ht 6\' 3"  (1.905 m)   Wt 107.5 kg   SpO2 95%   BMI 29.62 kg/m   Physical Exam Constitutional:      General: He is not in acute distress.    Appearance: Normal appearance. He is well-developed. He is not diaphoretic.     Comments: Tired.  HENT:     Head: Normocephalic and atraumatic.  Eyes:     General: Vision grossly intact. Gaze aligned appropriately.     Pupils: Pupils are equal, round, and reactive to light.  Neck:     Trachea: Trachea and phonation normal.  Pulmonary:     Effort: Pulmonary effort is normal. No respiratory distress.  Abdominal:     General: There is no distension.     Palpations: Abdomen is soft.     Tenderness: There is no abdominal tenderness. There is no guarding or rebound.  Genitourinary:    Comments: Examination chaperoned by Alva Garnet. Large amount of swelling extending from the groin down into the scrotum, discoloration purple/black, no purulent drainage or dehiscence of surgical site.  No significant tenderness on exam. Musculoskeletal:        General: Normal range of motion.     Cervical back: Normal range of motion.  Skin:    General: Skin is warm and dry.  Neurological:     Mental Status: He is lethargic.     GCS: GCS eye subscore is 4. GCS verbal subscore is 5. GCS motor subscore is 6.     Comments: Speech is clear and goal oriented, follows commands Major Cranial nerves without deficit, no facial droop Moves extremities without ataxia, coordination intact  Psychiatric:        Behavior: Behavior normal.     ED Results / Procedures / Treatments   Labs (all labs ordered are listed, but only abnormal results are displayed) Labs Reviewed  CBC WITH DIFFERENTIAL/PLATELET - Abnormal; Notable for the following components:       Result Value   WBC 13.4 (*)    RBC 2.81 (*)    Hemoglobin 8.5 (*)    HCT 23.8 (*)    Neutro Abs 10.4 (*)    Monocytes Absolute 1.7 (*)    Abs Immature Granulocytes 0.27 (*)    All other components within normal limits  COMPREHENSIVE METABOLIC PANEL - Abnormal; Notable for the following components:   Sodium 109 (*)    Chloride 79 (*)    CO2 21 (*)    Glucose, Bld 114 (*)    Calcium 8.0 (*)    Total Protein 5.7 (*)    Alkaline Phosphatase 25 (*)    Total Bilirubin 1.3 (*)    All other components within normal limits  URINALYSIS, ROUTINE W REFLEX MICROSCOPIC - Abnormal; Notable for the following components:   Ketones, ur 20 (*)    Protein, ur 30 (*)    All other components within normal limits  RETICULOCYTES - Abnormal; Notable for the following components:   RBC. 2.65 (*)    Immature Retic Fract 17.9 (*)    All other components within normal limits  I-STAT CHEM 8, ED - Abnormal; Notable for the following components:   Sodium 111 (*)    Chloride 78 (*)    Glucose, Bld 114 (*)    Calcium, Ion 1.09 (*)    Hemoglobin 7.5 (*)    HCT 22.0 (*)    All other components within normal limits  SARS CORONAVIRUS 2 BY RT PCR (HOSPITAL ORDER, [facility])  CULTURE, BLOOD (ROUTINE X 2)  CULTURE, BLOOD (ROUTINE X 2)  LACTIC ACID, PLASMA  OSMOLALITY, URINE  OSMOLALITY  SODIUM, URINE, RANDOM  TSH  CORTISOL  BASIC METABOLIC PANEL  BASIC METABOLIC PANEL  BASIC METABOLIC PANEL  BASIC METABOLIC PANEL  BASIC METABOLIC PANEL  BASIC METABOLIC PANEL  VITAMIN J28  FOLATE  IRON AND TIBC  FERRITIN  OCCULT BLOOD X 1 CARD TO LAB, STOOL  HEMOGLOBIN AND HEMATOCRIT, BLOOD  HEMOGLOBIN AND HEMATOCRIT, BLOOD  HEMOGLOBIN AND HEMATOCRIT, BLOOD  EKG None  Radiology DG Chest Portable 1 View  Result Date: 04/13/2020 CLINICAL DATA:  Cough, nausea, weakness, dizziness EXAM: PORTABLE CHEST 1 VIEW COMPARISON:  11/20/2019 chest radiograph. FINDINGS: Stable cardiomediastinal  silhouette with normal heart size. No pneumothorax. New small left pleural effusion. No right pleural effusion. Mild indistinct patchy opacities in upper right lung. Mild left basilar scarring versus atelectasis. IMPRESSION: 1. Mild indistinct patchy opacities in the upper right lung, cannot exclude pneumonia. Suggest short-term PA and lateral chest radiograph follow-up. 2. New small left pleural effusion. Mild left basilar scarring versus atelectasis. Electronically Signed   By: Ilona Sorrel M.D.   On: 04/13/2020 10:18    Procedures .Critical Care Performed by: Deliah Boston, PA-C Authorized by: Deliah Boston, PA-C   Critical care provider statement:    Critical care time (minutes):  32   Critical care was time spent personally by me on the following activities:  Discussions with consultants, evaluation of patient's response to treatment, examination of patient, ordering and performing treatments and interventions, ordering and review of laboratory studies, ordering and review of radiographic studies, pulse oximetry, re-evaluation of patient's condition, obtaining history from patient or surrogate and review of old charts   (including critical care time)  Medications Ordered in ED Medications  0.9 %  sodium chloride infusion ( Intravenous New Bag/Given 04/13/20 1430)    ED Course  I have reviewed the triage vital signs and the nursing notes.  Pertinent labs & imaging results that were available during my care of the patient were reviewed by me and considered in my medical decision making (see chart for details).    MDM Rules/Calculators/A&P                          Additional history obtained from: 1. Nursing notes from this visit. 2. Review of electronic medical record. ----------------------------- 64 year old male history as above arrived tired appearing but no acute distress vital signs stable on room air.  Primary complaint is fatigue also some mild abdominal pain  around his lower abdomen where he recently had surgery, no rebound guarding or peritoneal signs.  No evidence of infection at the incision site.  He has significant swelling of the scrotum bruising but no significant tenderness there.  Basic labs were ordered in triage.  Does not meet sepsis/SIRS criteria on initial evaluation.  Ordered ultrasound of the scrotum for evaluation of the swelling.  I then discussed the case with Dr. Constance Haw who advised that swelling and discoloration are normal post inguinal hernia repair, advises she will be down later to evaluate patient. - I reviewed and interpreted labs which include: CBC shows leukocytosis of 13.4, suspect secondary to recent surgery, no recent to compare.  Hemoglobin 8.5, none in the last 8 years to compare. CMP shows hyponatremia of 109, hypochloremia of 79, normal creatinine, no emergent LFT elevations, no gap. Urinalysis shows ketones and protein no evidence of infection. Covid test negative. Repeat Chem-8 ordered to recheck accuracy of sodium level, hyponatremia 111.  CXR:  IMPRESSION:  1. Mild indistinct patchy opacities in the upper right lung, cannot  exclude pneumonia. Suggest short-term PA and lateral chest  radiograph follow-up.  2. New small left pleural effusion. Mild left basilar scarring  versus atelectasis.  - Patient reevaluated resting comfortably no acute distress appears tired, mental status intact.  No history of seizures.  100 mL/h normal saline infusion ordered for hyponatremia, patient will need admission to hospitalist service.  Patient was seen and evaluated by Dr. Laverta Baltimore during this visit agrees with work-up, medications and admission at this time.  I also rediscussed the case with Dr. Constance Haw and advised patient was being admitted for hyponatremia, Dr. Constance Haw advises she will be coming down to evaluate patient.  Discussed the case with Dr. Manuella Ghazi and patient was accepted to hospitalist service.  Note: Portions of this  report may have been transcribed using voice recognition software. Every effort was made to ensure accuracy; however, inadvertent computerized transcription errors may still be present. Final Clinical Impression(s) / ED Diagnoses Final diagnoses:  Hyponatremia    Rx / DC Orders ED Discharge Orders    None       Gari Crown 04/13/20 1450    Long, Wonda [facility], MD 04/15/20 [phone]

## 2020-04-13 NOTE — H&P (Signed)
History and Physical    Jeffrey Gordon JJO:[id number] DOB: 02-Nov-1955 DOA: 04/13/2020  PCP: Celene Squibb, MD   Patient coming from: Home  Chief Complaint: Abdominal pain/N/V/weakness/fatigue  HPI: Jeffrey Gordon is a 64 y.o. male with medical history significant for COPD, hypothyroidism, GERD, hypertension, and anxiety/depression who has undergone recent right inguinal hernia repair on 04/10/2020.  He reports that since his surgery he has had increasing fatigue with weakness as well as nausea and vomiting.  He continues to have mild lower abdominal pain that has remained unchanged since the surgery.  He also notes some swelling to his groin and scrotal region that has also worsened since the surgery.  He is currently unable to eat and has not had a bowel movement since Friday morning.  He denies any fevers or chills and has not had any chest pain or shortness of breath.  He denies any falls or recent injury.   ED Course: Vital signs are stable and patient is currently afebrile.  He does have leukocytosis of 13,400 and sodium is noted to be very low at 109.  No recent lab work obtained for comparison.  His hemoglobin is also 8.5 which was noted to be 14 on 8/20.  He appears to be quite lethargic on examination.  He is noted to have hiccups and is noted significant swelling and bruising over his thighs and scrotal region.  Review of Systems: All others reviewed and otherwise negative.  Past Medical History:  Diagnosis Date  . Allergy   . Anxiety   . Blurred vision, left eye   . Cataract   . COPD (chronic obstructive pulmonary disease) ([place])   . Depression   . GERD (gastroesophageal reflux disease)   . High cholesterol   . Hypertension   . Hypothyroidism     Past Surgical History:  Procedure Laterality Date  . CATARACT EXTRACTION W/ INTRAOCULAR LENS  IMPLANT, BILATERAL     2002 (left); 2012 (right)  . COLONOSCOPY    . EYE SURGERY    . [place]   left  .  INGUINAL HERNIA REPAIR Right 04/10/2020   Procedure: HERNIA REPAIR INGUINAL ADULT;  Surgeon: Virl Cagey, MD;  Location: [facility];  Service: General;  Laterality: Right;  . PARS PLANA VITRECTOMY  10/20/11   "took out the old lens implant and put a new one in"  . PARS PLANA VITRECTOMY  10/20/2011   Procedure: PARS PLANA VITRECTOMY WITH 25G REMOVAL/SUTURE INTRAOCULAR LENS;  Surgeon: Hayden Pedro, MD;  Location: [facility];  Service: Ophthalmology;  Laterality: Left;  . RETINAL DETACHMENT SURGERY  2002   left  . RETINAL DETACHMENT SURGERY  2003   "repair; left"     reports that he has never smoked. He has never used smokeless tobacco. He reports previous alcohol use. He reports that he does not use drugs.  Allergies  Allergen Reactions  . Durezol [Difluprednate] Other (See Comments)    "Pressure increased in my eye"  . Advair Diskus [Fluticasone-Salmeterol] Other (See Comments)    Issues sleeping    Family History  Problem Relation Age of Onset  . Colon cancer Father   . Esophageal cancer Neg Hx   . Rectal cancer Neg Hx   . Stomach cancer Neg Hx     Prior to Admission medications   Medication Sig Start Date End Date Taking? Authorizing Provider  aspirin EC 81 MG tablet Take 81 mg by mouth daily.    [provider]  brimonidine-timolol (COMBIGAN) 0.2-0.5 % ophthalmic solution Place 1 drop into the right eye in the morning and at bedtime.     [provider]  CO ENZYME Q-10 PO Take 1 capsule by mouth daily.     [provider]  docusate sodium (COLACE) 100 MG capsule Take 1 capsule (100 mg total) by mouth 2 (two) times daily as needed for mild constipation (related to narcotic medication). 04/10/20 04/10/21  Virl Cagey, MD  dorzolamide (TRUSOPT) 2 % ophthalmic solution  11/18/19   [provider]  doxazosin (CARDURA) 4 MG tablet Take 4 mg by mouth at bedtime.     [provider]  FLUoxetine (PROZAC) 40 MG capsule Take 40 mg by mouth  daily.    [provider]  latanoprost (XALATAN) 0.005 % ophthalmic solution Place 1 drop into both eyes at bedtime.  02/20/20   [provider]  levothyroxine (SYNTHROID, LEVOTHROID) 88 MCG tablet Take 88 mcg by mouth daily before breakfast.     [provider]  losartan (COZAAR) 100 MG tablet Take 100 mg by mouth daily.    [provider]  Multiple Vitamins-Minerals (ICAPS AREDS 2) CAPS Take 2 capsules by mouth daily. Patient not taking: Reported on 03/31/2020    [provider]  Multiple Vitamins-Minerals (PRESERVISION AREDS 2 PO) Take 1 capsule by mouth in the morning and at bedtime.    [provider]  omeprazole (PRILOSEC) 20 MG capsule Take 20 mg by mouth daily.    [provider]  ondansetron (ZOFRAN) 4 MG tablet Take 1 tablet (4 mg total) by mouth every 8 (eight) hours as needed. 04/10/20 04/10/21  Virl Cagey, MD  oxyCODONE (ROXICODONE) 5 MG immediate release tablet Take 1 tablet (5 mg total) by mouth every 4 (four) hours as needed for severe pain or breakthrough pain. 04/10/20 04/10/21  Virl Cagey, MD  prednisoLONE acetate (PRED FORTE) 1 % ophthalmic suspension  11/19/19   [provider]  PROAIR HFA 108 (90 Base) MCG/ACT inhaler INHALE 1 TO 2 PUFFS BY MOUTH EVERY 4 TO 6 HOURS PRF SHORTNESS OF BREATH/WHEEZING Patient not taking: No sig reported 10/01/18   [provider]  rosuvastatin (CRESTOR) 10 MG tablet Take 10 mg by mouth daily.    [provider]  sildenafil (VIAGRA) 100 MG tablet Take 100 mg by mouth daily as needed. Patient not taking: Reported on 03/31/2020    [provider]  SYMBICORT 160-4.5 MCG/ACT inhaler INHALE 2 PUFFS INTO THE LUNGS TWICE DAILY 04/08/20   Marshell Garfinkel, MD    Physical Exam: Vitals:   04/13/20 0948 04/13/20 1330 04/13/20 1400 04/13/20 1430  BP:  (!) 141/81 140/79 134/74  Pulse:  81 83 85  Resp:  (!) 22 (!) 22 (!) 21  Temp:      TempSrc:        SpO2:  96% 96% 95%  Weight: 107.5 kg     Height: 6\' 3"  (1.905 m)       Constitutional: NAD, calm, comfortable Vitals:   04/13/20 0948 04/13/20 1330 04/13/20 1400 04/13/20 1430  BP:  (!) 141/81 140/79 134/74  Pulse:  81 83 85  Resp:  (!) 22 (!) 22 (!) 21  Temp:      TempSrc:      SpO2:  96% 96% 95%  Weight: 107.5 kg     Height: 6\' 3"  (1.905 m)      Eyes: lids and conjunctivae normal ENMT: Mucous membranes are  moist.  Neck: normal, supple Respiratory: clear to auscultation bilaterally. Normal respiratory effort. No accessory muscle use.  Cardiovascular: Regular rate and rhythm, no murmurs. No extremity edema. Abdomen: no tenderness, no distention. Bowel sounds positive.  Musculoskeletal:  No joint deformity upper and lower extremities.   Skin: no rashes, lesions, ulcers.  Psychiatric: Normal judgment and insight. Alert and oriented x 3. Normal mood.   Labs on Admission: I have personally reviewed following labs and imaging studies  CBC: Recent Labs  Lab 04/13/20 1244 04/13/20 1351  WBC 13.4*  --   NEUTROABS 10.4*  --   HGB 8.5* 7.5*  HCT 23.8* 22.0*  MCV 84.7  --   PLT 194  --    Basic Metabolic Panel: Recent Labs  Lab 04/13/20 1244 04/13/20 1351  NA 109* 111*  K 3.8 3.8  CL 79* 78*  CO2 21*  --   GLUCOSE 114* 114*  BUN 10 8  CREATININE 0.70 0.70  CALCIUM 8.0*  --    GFR: Estimated Creatinine Clearance: 122 mL/min (by C-G formula based on SCr of 0.7 mg/dL). Liver Function Tests: Recent Labs  Lab 04/13/20 1244  AST 34  ALT 21  ALKPHOS 25*  BILITOT 1.3*  PROT 5.7*  ALBUMIN 3.5   No results for input(s): LIPASE, AMYLASE in the last 168 hours. No results for input(s): AMMONIA in the last 168 hours. Coagulation Profile: No results for input(s): INR, PROTIME in the last 168 hours. Cardiac Enzymes: No results for input(s): CKTOTAL, CKMB, CKMBINDEX, TROPONINI in the last 168 hours. BNP (last 3 results) No results for input(s): PROBNP in the last 8760  hours. HbA1C: No results for input(s): HGBA1C in the last 72 hours. CBG: No results for input(s): GLUCAP in the last 168 hours. Lipid Profile: No results for input(s): CHOL, HDL, LDLCALC, TRIG, CHOLHDL, LDLDIRECT in the last 72 hours. Thyroid Function Tests: No results for input(s): TSH, T4TOTAL, FREET4, T3FREE, THYROIDAB in the last 72 hours. Anemia Panel: Recent Labs    04/13/20 1413  RETICCTPCT 2.3   Urine analysis:    Component Value Date/Time   COLORURINE YELLOW 04/13/2020 1120   APPEARANCEUR CLEAR 04/13/2020 1120   LABSPEC 1.024 04/13/2020 1120   PHURINE 6.0 04/13/2020 1120   GLUCOSEU NEGATIVE 04/13/2020 1120   HGBUR NEGATIVE 04/13/2020 1120   HGBUR negative 04/24/2008 0820   BILIRUBINUR NEGATIVE 04/13/2020 1120   KETONESUR 20 (A) 04/13/2020 1120   PROTEINUR 30 (A) 04/13/2020 1120   UROBILINOGEN 0.2 04/24/2008 0820   NITRITE NEGATIVE 04/13/2020 1120   LEUKOCYTESUR NEGATIVE 04/13/2020 1120    Radiological Exams on Admission: DG Chest Portable 1 View  Result Date: 04/13/2020 CLINICAL DATA:  Cough, nausea, weakness, dizziness EXAM: PORTABLE CHEST 1 VIEW COMPARISON:  11/20/2019 chest radiograph. FINDINGS: Stable cardiomediastinal silhouette with normal heart size. No pneumothorax. New small left pleural effusion. No right pleural effusion. Mild indistinct patchy opacities in upper right lung. Mild left basilar scarring versus atelectasis. IMPRESSION: 1. Mild indistinct patchy opacities in the upper right lung, cannot exclude pneumonia. Suggest short-term PA and lateral chest radiograph follow-up. 2. New small left pleural effusion. Mild left basilar scarring versus atelectasis. Electronically Signed   By: Ilona Sorrel M.D.   On: 04/13/2020 10:18    EKG: Independently reviewed. NSR 85bpm.  Assessment/Plan Active Problems:   Hyponatremia    Severe symptomatic hyponatremia -Likely related to severe dehydration -Noted to have some lethargy and will require stepdown unit  monitoring -Plan to transfuse normal saline at 75 mL/h and  monitor sodium carefully every 2 hours with goal rate of correction no greater than 8 mEq per 24-hour period -Appreciate nephrology evaluation -Plan to check TSH, cortisol, urine and serum osmolarities for further workup -Plan to hold Prozac and losartan  Acute anemia related to blood loss in scrotal region and thigh -Noted to have ecchymosis of her scrotal and thigh region -Plan to elevate scrotal region and place ice -Recent right inguinal hernia repair noted on 8/27, appreciate general surgery consultation -Continue to monitor repeat hemoglobin and hematocrit every 4 hours -Transfuse for hemoglobin less than 7  History of COPD -Currently stable -DuoNebs as needed for shortness of breath or wheezing  History of anxiety/depression -Plan to hold Prozac  History of hypertension -Plan to hold losartan -Plan to hold Cardura  History of hypothyroidism -Continue levothyroxine -Check TSH as noted above  History of GERD -PPI  DVT prophylaxis: SCDs Code Status: Full Family Communication: Discussed with wife at bedside. Disposition Plan:Admit for treatment of hyponatremia Consults called:Nephrology Admission status: Inpatient, [place] [facility]  If 7PM-7AM, please contact night-coverage [email or web address]  04/13/2020, 2:42 PM

## 2020-04-13 NOTE — Progress Notes (Signed)
[facility]  CT done and reviewed with Dr. Thornton Papas. No hernia recurrence. Two plugs in place and some post operative fluid/ hematoma and small foci of gas in the area. Hematoma into the scrotum. None tender in the inguinal or scrotal area. No obstruction or ileus.  Diet as tolerated.  May need blood given extent of hematoma, trend H&H Getting resuscitation for hyponatremia prob related to volume loss and dehydration Foley for now given swelling and BPH Will monitor for any signs of infection.  Curlene Labrum, MD [facility] [address] [place], [place] [address] [phone] (office)

## 2020-04-13 NOTE — ED Notes (Signed)
CRITICAL VALUE ALERT  Critical Value:  Sodium 109  Date & Time Notified: 04/13/20 & 1338 hrs  Provider Notified: Dr. Laverta Baltimore  Orders Received/Actions taken: N/A

## 2020-04-13 NOTE — Telephone Encounter (Signed)
Pt's wife called and states that the pt is not feeling well at all that over the past few days he has gotten worse. (pt had hernia repair on 04/10/2020) Pt is not having any issues with the surgical site except some expected swelling that is slowly resolving but is having a lot of body aches, cough and chest congestion. He is not running any fever. I recommended that she contact his PCP for evaluation. Pt's wife verbalized understanding.

## 2020-04-13 NOTE — ED Notes (Signed)
CRITICAL VALUE ALERT  Critical Value:  Sodium 110  Date & Time Notied:  04/13/20 & 1801 hrs  Provider Notified: Dr. Manuella Ghazi  Orders Received/Actions taken: N/A

## 2020-04-13 NOTE — ED Triage Notes (Addendum)
Pt reports had r inguinal surgery Friday and since Saturday has had weakness, n/v, abd pain, and cough.    Reports no bm since Early Friday morning before his surgery.

## 2020-04-13 NOTE — Consult Note (Addendum)
Reason for Consult: Hyponatremia Referring Physician: Manuella Ghazi, DO  ERYX ZANE is an 64 y.o. male with a PMH significant for HTN, Depression, GERD, HLD, hypothyroidism, and recent inguinal hernia repair on 04/10/20 who presented to [facility] ED with a  3 day history of abdominal pain, nausea, vomiting, anorexia, constipation, weakness, and fatigue.  In the ED he was noted to have stable vital signs, labwork was remarkable for WBC of 13.4, Hgb 8.5, chloride of 79, and sodium of 109.  Of note, he has been taking prozac as well as losartan despite his episodes of N/V.  He denies any lower extremity edema, or increased water intake (has not taken much at home and his wife reports he stayed in bed since the surgery).  The trend in Scr is seen below.  Unfortunately we do not have any recent sodium levels prior to this admission.   Trend in Serum Sodium: Sodium  Date/Time Value Ref Range Status  04/13/2020 01:51 PM 111 (LL) 135 - 145 mmol/L Final  04/13/2020 12:44 PM 109 (LL) 135 - 145 mmol/L Final  10/20/2011 09:22 AM 135 135 - 145 mEq/L Final  11/14/2008 10:34 PM 141 135 - 145 meq/L Final  04/24/2008 11:50 PM 140 135 - 145 meq/L Final  12/03/2007 08:22 PM 143 135 - 145 meq/L Final  06/04/2007 10:25 PM 139 135 - 145 meq/L Final  11/20/2006 07:34 PM 139 135 - 145 meq/L Final    PMH:   Past Medical History:  Diagnosis Date  . Allergy   . Anxiety   . Blurred vision, left eye   . Cataract   . COPD (chronic obstructive pulmonary disease) ([place])   . Depression   . GERD (gastroesophageal reflux disease)   . High cholesterol   . Hypertension   . Hypothyroidism     PSH:   Past Surgical History:  Procedure Laterality Date  . CATARACT EXTRACTION W/ INTRAOCULAR LENS  IMPLANT, BILATERAL     2002 (left); 2012 (right)  . COLONOSCOPY    . EYE SURGERY    . [place]   left  . INGUINAL HERNIA REPAIR Right 04/10/2020   Procedure: HERNIA REPAIR INGUINAL ADULT;  Surgeon: Virl Cagey, MD;  Location: [facility];  Service: General;  Laterality: Right;  . PARS PLANA VITRECTOMY  10/20/11   "took out the old lens implant and put a new one in"  . PARS PLANA VITRECTOMY  10/20/2011   Procedure: PARS PLANA VITRECTOMY WITH 25G REMOVAL/SUTURE INTRAOCULAR LENS;  Surgeon: Hayden Pedro, MD;  Location: [place];  Service: Ophthalmology;  Laterality: Left;  . RETINAL DETACHMENT SURGERY  2002   left  . RETINAL DETACHMENT SURGERY  2003   "repair; left"    Allergies:  Allergies  Allergen Reactions  . Durezol [Difluprednate] Other (See Comments)    "Pressure increased in my eye"  . Advair Diskus [Fluticasone-Salmeterol] Other (See Comments)    Issues sleeping    Medications:   Prior to Admission medications   Medication Sig Start Date End Date Taking? Authorizing Provider  aspirin EC 81 MG tablet Take 81 mg by mouth daily.    [provider]  brimonidine-timolol (COMBIGAN) 0.2-0.5 % ophthalmic solution Place 1 drop into the right eye in the morning and at bedtime.     [provider]  CO ENZYME Q-10 PO Take 1 capsule by mouth daily.     [provider]  docusate sodium (COLACE) 100 MG capsule Take 1 capsule (100  mg total) by mouth 2 (two) times daily as needed for mild constipation (related to narcotic medication). 04/10/20 04/10/21  Virl Cagey, MD  dorzolamide (TRUSOPT) 2 % ophthalmic solution  11/18/19   [provider]  doxazosin (CARDURA) 4 MG tablet Take 4 mg by mouth at bedtime.     [provider]  FLUoxetine (PROZAC) 40 MG capsule Take 40 mg by mouth daily.    [provider]  latanoprost (XALATAN) 0.005 % ophthalmic solution Place 1 drop into both eyes at bedtime.  02/20/20   [provider]  levothyroxine (SYNTHROID, LEVOTHROID) 88 MCG tablet Take 88 mcg by mouth daily before breakfast.     [provider]  losartan (COZAAR) 100 MG tablet Take 100 mg by mouth daily.    [provider]   Multiple Vitamins-Minerals (ICAPS AREDS 2) CAPS Take 2 capsules by mouth daily. Patient not taking: Reported on 03/31/2020    [provider]  Multiple Vitamins-Minerals (PRESERVISION AREDS 2 PO) Take 1 capsule by mouth in the morning and at bedtime.    [provider]  omeprazole (PRILOSEC) 20 MG capsule Take 20 mg by mouth daily.    [provider]  ondansetron (ZOFRAN) 4 MG tablet Take 1 tablet (4 mg total) by mouth every 8 (eight) hours as needed. 04/10/20 04/10/21  Virl Cagey, MD  oxyCODONE (ROXICODONE) 5 MG immediate release tablet Take 1 tablet (5 mg total) by mouth every 4 (four) hours as needed for severe pain or breakthrough pain. 04/10/20 04/10/21  Virl Cagey, MD  prednisoLONE acetate (PRED FORTE) 1 % ophthalmic suspension  11/19/19   [provider]  PROAIR HFA 108 (90 Base) MCG/ACT inhaler INHALE 1 TO 2 PUFFS BY MOUTH EVERY 4 TO 6 HOURS PRF SHORTNESS OF BREATH/WHEEZING Patient not taking: No sig reported 10/01/18   [provider]  rosuvastatin (CRESTOR) 10 MG tablet Take 10 mg by mouth daily.    [provider]  sildenafil (VIAGRA) 100 MG tablet Take 100 mg by mouth daily as needed. Patient not taking: Reported on 03/31/2020    [provider]  SYMBICORT 160-4.5 MCG/ACT inhaler INHALE 2 PUFFS INTO THE LUNGS TWICE DAILY 04/08/20   Marshell Garfinkel, MD    Discontinued Meds:  There are no discontinued medications.  Social History:  reports that he has never smoked. He has never used smokeless tobacco. He reports previous alcohol use. He reports that he does not use drugs.  Family History:   Family History  Problem Relation Age of Onset  . Colon cancer Father   . Esophageal cancer Neg Hx   . Rectal cancer Neg Hx   . Stomach cancer Neg Hx     Pertinent items are noted in HPI.  Blood pressure 136/72, pulse 78, temperature 98.2 F (36.8 C), temperature source Oral, resp. rate 19, height 6\' 3"  (1.905 m), weight  107.5 kg, SpO2 95 %. General appearance: fatigued, mild distress, pale and slowed mentation Head: Normocephalic, without obvious abnormality, atraumatic Resp: clear to auscultation bilaterally Cardio: regular rate and rhythm, S1, S2 normal, no murmur, click, rub or gallop GI: distended, hypoactive BS, soft, mildly tender to palpation, no guarding or rebound Male genitalia: normal, scrotal swelling with large ecchymoses of groins and scrotum Extremities: extremities normal, atraumatic, no cyanosis or edema  Labs: Basic Metabolic Panel: Recent Labs  Lab 04/13/20 1244 04/13/20 1351  NA 109* 111*  K 3.8 3.8  CL 79* 78*  CO2 21*  --  GLUCOSE 114* 114*  BUN 10 8  CREATININE 0.70 0.70  ALBUMIN 3.5  --   CALCIUM 8.0*  --    Liver Function Tests: Recent Labs  Lab 04/13/20 1244  AST 34  ALT 21  ALKPHOS 25*  BILITOT 1.3*  PROT 5.7*  ALBUMIN 3.5   No results for input(s): LIPASE, AMYLASE in the last 168 hours. No results for input(s): AMMONIA in the last 168 hours. CBC: Recent Labs  Lab 04/13/20 1244 04/13/20 1351  WBC 13.4*  --   NEUTROABS 10.4*  --   HGB 8.5* 7.5*  HCT 23.8* 22.0*  MCV 84.7  --   PLT 194  --    PT/INR: @labrcntip (inr:5) Cardiac Enzymes: No results for input(s): CKTOTAL, CKMB, CKMBINDEX, TROPONINI in the last 168 hours. CBG: No results for input(s): GLUCAP in the last 168 hours.  Iron Studies:  Recent Labs  Lab 04/13/20 1413  IRON 27*  TIBC 262  FERRITIN 181    Xrays/Other Studies: DG Chest Portable 1 View  Result Date: 04/13/2020 CLINICAL DATA:  Cough, nausea, weakness, dizziness EXAM: PORTABLE CHEST 1 VIEW COMPARISON:  11/20/2019 chest radiograph. FINDINGS: Stable cardiomediastinal silhouette with normal heart size. No pneumothorax. New small left pleural effusion. No right pleural effusion. Mild indistinct patchy opacities in upper right lung. Mild left basilar scarring versus atelectasis. IMPRESSION: 1. Mild indistinct patchy opacities  in the upper right lung, cannot exclude pneumonia. Suggest short-term PA and lateral chest radiograph follow-up. 2. New small left pleural effusion. Mild left basilar scarring versus atelectasis. Electronically Signed   By: Ilona Sorrel M.D.   On: 04/13/2020 10:18     Assessment/Plan: 1.  Severe symptomatic Hyponatremia- most consistent with hypovolemic hyponatremia (volume depletion as well as ABLA in setting of SSRI and ARB therapy).   1. Agree with starting IVF's and follow serum sodium levels closely.  Recommend admission to SDU/ICU given lethargy and somnolence.   2. Repeat sodium level was 111 without any intervention.   3. Hold prozac and losartan for now 4. Goal sodium level is 117 over the next 24 hours 5. Need to follow sodium levels in 2 hours after starting IV NS.  If it drops again would stop iv ns and start 3% saline at 15 mL/hr.   6. TSH normal. Awaiting cortisol, urine Na, urine Osm, and serum osm. 2. ABLA- hgb dropped from 14.2 to 7.5 with large ecchymoses of scrotum and groin.  Transfuse prn.  Await surgery to evaluate 3. Constipation and abdominal pain- recommend imaging to r/o ileus or obstruction 4. Inguinal hernia repair- with hematoma/ecchymoses per surgery 5. HTN- hold losartan for now 6. Hypothyroidism- TSH WNL 7. GERD- on PPI   Donetta Potts 04/13/2020, 3:54 PM

## 2020-04-13 NOTE — Consult Note (Signed)
[facility]  Patient s/p right inguinal hernia repair with mesh for a large inguinal hernia, 2 large plugs and patches placed. He says he was bruised and swollen on Friday when he finally made it home and was able to urinate at home. He says that he did start having vomiting on Friday. He did not call over the weekend but they decided to call this AM when he was so weak and worsening.  He came in feeling weak and has been found to have hyponatremia and H&H drop to 8.  He has been having nausea and vomiting. He has been urinating but had no BM.  He has minimal pain in the groin and has been taking ibuprofen and tylenol and only a little bit of narcotics.  BP 136/72   Pulse 78   Temp 98.2 F (36.8 C) (Oral)   Resp 19   Ht 6\' 3"  (1.905 m)   Wt 107.5 kg   SpO2 95%   BMI 29.62 kg/m   Mildly distressed Normal work breathing [place]upp[place] Soft, distended, right inguinal incision c/d/i with no erythema, extensive bruising in the right and left groin, swollen scrotum and down into the right thigh     CBC    Component Value Date/Time   WBC 13.4 (H) 04/13/2020 1244   RBC 2.65 (L) 04/13/2020 1413   RBC 2.81 (L) 04/13/2020 1244   HGB 7.5 (L) 04/13/2020 1351   HCT 22.0 (L) 04/13/2020 1351   PLT 194 04/13/2020 1244   MCV 84.7 04/13/2020 1244   MCH 30.2 04/13/2020 1244   MCHC 35.7 04/13/2020 1244   RDW 13.5 04/13/2020 1244   LYMPHSABS 0.9 04/13/2020 1244   MONOABS 1.7 (H) 04/13/2020 1244   EOSABS 0.0 04/13/2020 1244   BASOSABS 0.0 04/13/2020 1244   CMP     Component Value Date/Time   NA 111 (LL) 04/13/2020 1351   K 3.8 04/13/2020 1351   CL 78 (L) 04/13/2020 1351   CO2 21 (L) 04/13/2020 1244   GLUCOSE 114 (H) 04/13/2020 1351   BUN 8 04/13/2020 1351   CREATININE 0.70 04/13/2020 1351   CALCIUM 8.0 (L) 04/13/2020 1244   PROT 5.7 (L) 04/13/2020 1244   ALBUMIN 3.5 04/13/2020 1244   AST 34 04/13/2020 1244   ALT 21 04/13/2020 1244   ALKPHOS 25 (L) 04/13/2020 1244    BILITOT 1.3 (H) 04/13/2020 1244   GFRNONAA >60 04/13/2020 1244   GFRAA >60 04/13/2020 1244   Patient s/p right inguinal hernia repair with plugs and mesh for a large hernia. He has extensive bruising and swelling /hematoma in the groin area consistent with his H&H drop. He is also hyponatremic from not eating and drinking and vomiting and blood loss I would predict.  No abdominal pain or groin pain. No obvious signs of recurrence of obstructions but is at risk for this given the presentation he could have pulled a stitch and bled/ had a recurrence. No signs of infection at the incision site itself. Dermabond intact.  Getting resuscitation. May have ileus from bleeding and hyponatremia but want to rule out an obstruction CT a/p ordered stat no IV contrast Scrotum elevated and ice to the area.  Discussed with Dr. Manuella Ghazi.  Curlene Labrum, MD [facility] [address] [place], [place] [address] [phone] (office)

## 2020-04-14 DIAGNOSIS — I10 Essential (primary) hypertension: Secondary | ICD-10-CM | POA: Diagnosis present

## 2020-04-14 DIAGNOSIS — F32A Depression, unspecified: Secondary | ICD-10-CM | POA: Diagnosis present

## 2020-04-14 DIAGNOSIS — D62 Acute posthemorrhagic anemia: Secondary | ICD-10-CM | POA: Diagnosis present

## 2020-04-14 LAB — CBC
HCT: 23.5 % — ABNORMAL LOW (ref 39.0–52.0)
Hemoglobin: 8.4 g/dL — ABNORMAL LOW (ref 13.0–17.0)
MCH: 30.1 pg (ref 26.0–34.0)
MCHC: 35.7 g/dL (ref 30.0–36.0)
MCV: 84.2 fL (ref 80.0–100.0)
Platelets: 216 10*3/uL (ref 150–400)
RBC: 2.79 MIL/uL — ABNORMAL LOW (ref 4.22–5.81)
RDW: 13.4 % (ref 11.5–15.5)
WBC: 11.3 10*3/uL — ABNORMAL HIGH (ref 4.0–10.5)
nRBC: 0 % (ref 0.0–0.2)

## 2020-04-14 LAB — BASIC METABOLIC PANEL
Anion gap: 10 (ref 5–15)
Anion gap: 9 (ref 5–15)
BUN: 8 mg/dL (ref 8–23)
BUN: 7 mg/dL — ABNORMAL LOW (ref 8–23)
CO2: 20 mmol/L — ABNORMAL LOW (ref 22–32)
CO2: 21 mmol/L — ABNORMAL LOW (ref 22–32)
Calcium: 7.9 mg/dL — ABNORMAL LOW (ref 8.9–10.3)
Calcium: 7.9 mg/dL — ABNORMAL LOW (ref 8.9–10.3)
Chloride: 83 mmol/L — ABNORMAL LOW (ref 98–111)
Chloride: 82 mmol/L — ABNORMAL LOW (ref 98–111)
Creatinine, Ser: 0.58 mg/dL — ABNORMAL LOW (ref 0.61–1.24)
Creatinine, Ser: 0.65 mg/dL (ref 0.61–1.24)
GFR calc Af Amer: >60 mL/min (ref >60)
GFR calc Af Amer: >60 mL/min (ref >60)
GFR calc non Af Amer: >60 mL/min (ref >60)
GFR calc non Af Amer: >60 mL/min (ref >60)
Glucose, Bld: 99 mg/dL (ref 70–99)
Glucose, Bld: 104 mg/dL — ABNORMAL HIGH (ref 70–99)
Potassium: 3.5 mmol/L (ref 3.5–5.1)
Potassium: 3.6 mmol/L (ref 3.5–5.1)
Sodium: 113 mmol/L — CL (ref 135–145)
Sodium: 112 mmol/L — CL (ref 135–145)

## 2020-04-14 LAB — HEMOGLOBIN AND HEMATOCRIT, BLOOD
HCT: 22.8 % — ABNORMAL LOW (ref 39.0–52.0)
HCT: 23.5 % — ABNORMAL LOW (ref 39.0–52.0)
HCT: 22.7 % — ABNORMAL LOW (ref 39.0–52.0)
HCT: 22.1 % — ABNORMAL LOW (ref 39.0–52.0)
Hemoglobin: 8 g/dL — ABNORMAL LOW (ref 13.0–17.0)
Hemoglobin: 8.3 g/dL — ABNORMAL LOW (ref 13.0–17.0)
Hemoglobin: 8 g/dL — ABNORMAL LOW (ref 13.0–17.0)
Hemoglobin: 7.9 g/dL — ABNORMAL LOW (ref 13.0–17.0)

## 2020-04-14 LAB — OSMOLALITY: Osmolality: 240 mOsm/kg — CL (ref 275–295)

## 2020-04-14 LAB — BASIC METABOLIC PANEL WITH GFR
Anion gap: 9 (ref 5–15)
Anion gap: 11 (ref 5–15)
BUN: 8 mg/dL (ref 8–23)
BUN: 8 mg/dL (ref 8–23)
CO2: 19 mmol/L — ABNORMAL LOW (ref 22–32)
CO2: 20 mmol/L — ABNORMAL LOW (ref 22–32)
Calcium: 7.8 mg/dL — ABNORMAL LOW (ref 8.9–10.3)
Calcium: 7.7 mg/dL — ABNORMAL LOW (ref 8.9–10.3)
Chloride: 83 mmol/L — ABNORMAL LOW (ref 98–111)
Chloride: 81 mmol/L — ABNORMAL LOW (ref 98–111)
Creatinine, Ser: 0.63 mg/dL (ref 0.61–1.24)
Creatinine, Ser: 0.65 mg/dL (ref 0.61–1.24)
GFR calc Af Amer: >60 mL/min
GFR calc Af Amer: >60 mL/min
GFR calc non Af Amer: >60 mL/min
GFR calc non Af Amer: >60 mL/min
Glucose, Bld: 92 mg/dL (ref 70–99)
Glucose, Bld: 99 mg/dL (ref 70–99)
Potassium: 3.4 mmol/L — ABNORMAL LOW (ref 3.5–5.1)
Potassium: 3.6 mmol/L (ref 3.5–5.1)
Sodium: 111 mmol/L — CL (ref 135–145)
Sodium: 112 mmol/L — CL (ref 135–145)

## 2020-04-14 LAB — SODIUM
Sodium: 113 mmol/L — CL (ref 135–145)
Sodium: 112 mmol/L — CL (ref 135–145)
Sodium: 112 mmol/L — CL (ref 135–145)
Sodium: 113 mmol/L — CL (ref 135–145)
Sodium: 113 mmol/L — CL (ref 135–145)

## 2020-04-14 LAB — MRSA PCR SCREENING: MRSA by PCR: NEGATIVE

## 2020-04-14 LAB — SODIUM, URINE, RANDOM: Sodium, Ur: 17 mmol/L

## 2020-04-14 LAB — MAGNESIUM: Magnesium: 1.9 mg/dL (ref 1.7–2.4)

## 2020-04-14 MED ORDER — CHLORHEXIDINE GLUCONATE CLOTH 2 % EX PADS
6.0000 | MEDICATED_PAD | Freq: Every day | CUTANEOUS | Status: DC
  Administered 2020-04-14 – 2020-04-16 (×3): 6 via TOPICAL

## 2020-04-14 MED ORDER — MELATONIN 3 MG PO TABS
6.0000 mg | ORAL_TABLET | Freq: Every evening | ORAL | Status: DC | PRN
  Administered 2020-04-14 – 2020-04-16 (×3): 6 mg via ORAL
  Filled 2020-04-14 (×3): qty 2

## 2020-04-14 MED ORDER — SODIUM CHLORIDE 3 % IV SOLN
INTRAVENOUS | Status: DC
  Filled 2020-04-14 (×2): qty 500

## 2020-04-14 MED ORDER — SODIUM CHLORIDE 3 % IV SOLN
INTRAVENOUS | Status: DC
  Filled 2020-04-14: qty 500

## 2020-04-14 MED ORDER — PROCHLORPERAZINE 25 MG RE SUPP
25.0000 mg | Freq: Once | RECTAL | Status: AC
  Administered 2020-04-14: 25 mg via RECTAL
  Filled 2020-04-14: qty 1

## 2020-04-14 MED ORDER — SODIUM CHLORIDE 0.9 % IV SOLN: INTRAVENOUS | Status: DC

## 2020-04-14 MED ORDER — POLYETHYLENE GLYCOL 3350 17 G PO PACK
17.0000 g | PACK | Freq: Two times a day (BID) | ORAL | Status: DC
  Administered 2020-04-14 – 2020-04-17 (×6): 17 g via ORAL
  Filled 2020-04-14 (×6): qty 1

## 2020-04-14 MED ORDER — SENNOSIDES-DOCUSATE SODIUM 8.6-50 MG PO TABS
2.0000 | ORAL_TABLET | Freq: Two times a day (BID) | ORAL | Status: DC
  Administered 2020-04-14 – 2020-04-17 (×6): 2 via ORAL
  Filled 2020-04-14 (×7): qty 2

## 2020-04-14 NOTE — Progress Notes (Addendum)
Patient ID: Jeffrey Gordon, male   DOB: 1956-02-12, 64 y.o.   MRN: [id number]  Cross cover  Despite IV fluids, patient serum sodium continues to remain same.  Patient remains euvolemic.  At baseline, patient has dementia hence, no objective way to determine if patient has an acute encephalopathy.  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  Nephrology consult will need to be considered. Consider tolvaptan if no improvement.  Would defer to day team for further management.

## 2020-04-14 NOTE — ED Notes (Signed)
CRITICAL VALUE ALERT  Critical Value:  Sodium 113  Date & Time Notied:  04/14/20 & 1024hrs  Provider Notified: Dr. Joesph Fillers  Orders Received/Actions taken: Notified

## 2020-04-14 NOTE — ED Notes (Signed)
Date and time results received: 04/14/20 0249  Test: Sodium Critical Value: 111  Name of Provider Notified: Acheampong, MD  Orders Received? Or Actions Taken?: acknowledged

## 2020-04-14 NOTE — Progress Notes (Signed)
[facility] Progress Note     Subjective: Na not responded but did not get his fluids overnight as intended. No further signs of bleeding H&H stable. Says he wants something for sleep tonight.   Objective: Vital signs in last 24 hours: Temp:  [98.2 F (36.8 C)] 98.2 F (36.8 C) (08/30 0947) Pulse Rate:  [78-98] 90 (08/31 0600) Resp:  [15-22] 19 (08/31 0600) BP: (127-154)/(58-127) 151/79 (08/31 0600) SpO2:  [92 %-98 %] 95 % (08/31 0843) Weight:  [107.5 kg] 107.5 kg (08/30 0948)    Intake/Output from previous day: 08/30 0701 - 08/31 0700 In: -  Out: 750 [Urine:750] Intake/Output this shift: Total I/O In: 1603.8 [I.V.:1603.8] Out: -   General appearance: alert, cooperative and no distress Resp: upper respiratory congestion/ rattle GI: soft, milldy distended, non tender, groin and thigh with ecchymosis from hematoma down into scrotum, evolving with some yellowing, no signs of erythema or drainage concerning for infection, swollen but no recurrence or tenderness appreciated   Lab Results:  Recent Labs    04/13/20 1244 04/13/20 1351 04/14/20 0533 04/14/20 0726  WBC 13.4*  --  11.3*  --   HGB 8.5*   < > 8.4* 8.3*  HCT 23.8*   < > 23.5* 23.5*  PLT 194  --  216  --    < > = values in this interval not displayed.   BMET Recent Labs    04/14/20 0533 04/14/20 0726  NA 113* 112*  K 3.5 3.6  CL 83* 82*  CO2 20* 21*  GLUCOSE 99 104*  BUN 8 7*  CREATININE 0.58* 0.65  CALCIUM 7.9* 7.9*   PT/INR No results for input(s): LABPROT, INR in the last 72 hours.  Studies/Results: CT ABDOMEN PELVIS W CONTRAST  Result Date: 04/13/2020 CLINICAL DATA:  Extraperitoneal RIGHT inguinal hernia repair 3 days ago utilizing (2) Perfix plug and mesh repair RIGHT inguinal canal, now with RIGHT scrotal swelling and hematoma EXAM: CT ABDOMEN AND PELVIS WITH CONTRAST TECHNIQUE: Multidetector CT imaging of the abdomen and pelvis was performed using the standard protocol  following bolus administration of intravenous contrast. Sagittal and coronal MPR images reconstructed from axial data set. CONTRAST:  141mL OMNIPAQUE IOHEXOL 300 MG/ML SOLN IV. No oral contrast. COMPARISON:  None FINDINGS: Lower chest: Bibasilar atelectasis and minimal pleural effusions Hepatobiliary: Gallbladder and liver normal appearance Pancreas: Normal appearance Spleen: Normal appearance Adrenals/Urinary Tract: Adrenal glands, kidneys, ureters, and bladder normal appearance. Foley catheter within urinary bladder. Stomach/Bowel: Normal appendix. Mildly prominent stool in cecum. Multiple radiodensities within stool in RIGHT colon, uncertain etiology. Stomach and bowel loops otherwise normal appearance. Vascular/Lymphatic: Atherosclerotic calcifications aorta. Aorta normal caliber. No adenopathy. Reproductive: Unremarkable prostate gland and seminal vesicles. Other: Small LEFT inguinal hernia containing fat. Small umbilical hernia containing fat. No free intraperitoneal air or fluid. Scattered subcutaneous edema at the perineum and thighs as well as the lateral aspects of the pelvis. Irregular high attenuation material within RIGHT scrotal sac extending into distal RIGHT inguinal canal, 8.2 x 8.1 by greater than 6.4 cm, consistent with hematoma. Two adjacent fluid collections containing tiny foci of gas are identified in the RIGHT inguinal canal, may represent the 2 plugs utilize for hernia repair. Surrounding infiltrative changes. Hemorrhage extends from below the plugs into the inguinal canals. Tiny foci of gas are seen within the RIGHT scrotum and within the RIGHT scrotal wall. No recurrent hernia. Musculoskeletal: Osseous structures unremarkable. IMPRESSION: 8.2 x 8.1 x 6.4 cm diameter hematoma in RIGHT scrotal sac extending into  distal inguinal canal. Testes are not delineated; if there is clinical concern for testicular torsion, ultrasound would be recommended. Two adjacent fluid collections containing tiny  foci of gas are identified in the RIGHT inguinal canal, may represent the 2 plugs utilized for hernia repair though cannot exclude other postoperative collections; if patient develops fever or leukocytosis and there is clinical concern for infection, would recommend repeat CT to exclude abscess and/or fasciitis. No recurrent hernia. Small LEFT inguinal and umbilical hernias containing fat. Bibasilar atelectasis and minimal pleural effusions. Aortic Atherosclerosis (ICD10-I70.0). Electronically Signed   By: Lavonia Dana M.D.   On: 04/13/2020 17:05   DG Chest Portable 1 View  Result Date: 04/13/2020 CLINICAL DATA:  Cough, nausea, weakness, dizziness EXAM: PORTABLE CHEST 1 VIEW COMPARISON:  11/20/2019 chest radiograph. FINDINGS: Stable cardiomediastinal silhouette with normal heart size. No pneumothorax. New small left pleural effusion. No right pleural effusion. Mild indistinct patchy opacities in upper right lung. Mild left basilar scarring versus atelectasis. IMPRESSION: 1. Mild indistinct patchy opacities in the upper right lung, cannot exclude pneumonia. Suggest short-term PA and lateral chest radiograph follow-up. 2. New small left pleural effusion. Mild left basilar scarring versus atelectasis. Electronically Signed   By: Ilona Sorrel M.D.   On: 04/13/2020 10:18    Anti-infectives: Anti-infectives (From admission, onward)   None      Assessment/Plan: Mr. Dollard is a 64 yo with a history of R inguinal hernia repair with plug and mesh. CT yesterday confirmed hernia repair intact and plugs in place but hematoma down into scrotum. No ileus or obstruction.  -Diet as tolerated -H&H stabilized -Na improving slowly but was not on a continuous gtt in the ED per Dr. Marval Regal, may need hypertonic saline if does not improve -Ecchymosis evolving, no signs of infection at this time   Discussed with Dr. Denton Brick and Dr. Marval Regal. Patient requesting sleep aid. Will see what Dr. Denton Brick thinks given Na.   Hopefully can get hospital bed to make him more comfortable today.   LOS: 1 day    Virl Cagey 04/14/2020

## 2020-04-14 NOTE — ED Notes (Signed)
CRITICAL VALUE ALERT  Critical Value:  Sodium 112  Date & Time Notied:  04/14/20 & 7322  Provider Notified: Dr. Joesph Fillers  Orders Received/Actions taken: Notified

## 2020-04-14 NOTE — Progress Notes (Signed)
CRITICAL VALUE ALERT  Critical Value:  Na 113  Date & Time Notied:  8/31 @ 6701.  Provider Notified: Courage, MD.  Orders Received/Actions taken: Increase rate of 3% Saline to 20 ml/hr.

## 2020-04-14 NOTE — Progress Notes (Signed)
Patient ID: Jeffrey Gordon, male   DOB: 1956-03-05, 64 y.o.   MRN: [id number] S: Feels "miserable" this am.  Not able to sleep and is very uncomfortable in the gurney in ED. O:BP (!) 151/79   Pulse 90   Temp 98.2 F (36.8 C) (Oral)   Resp 19   Ht 6\' 3"  (1.905 m)   Wt 107.5 kg   SpO2 95%   BMI 29.62 kg/m   Intake/Output Summary (Last 24 hours) at 04/14/2020 0954 Last data filed at 04/14/2020 0932 Gross per 24 hour  Intake 1603.83 ml  Output 750 ml  Net 853.83 ml   Intake/Output: I/O last 3 completed shifts: In: -  Out: 750 [Urine:750]  Intake/Output this shift:  Total I/O In: 1603.8 [I.V.:1603.8] Out: -  Weight change:  Gen: NAD CVS: RRR, no rub Resp: cta Abd: +BS, soft, NT/ND GU: large ecchymoses of groin and scrotum Ext: no edema  Recent Labs  Lab 04/13/20 1244 04/13/20 1351 04/13/20 1812 04/13/20 2017 04/13/20 2204 04/14/20 0102 04/14/20 0324 04/14/20 0533 04/14/20 0726  NA 109*   < > 112* 113* 112* 111* 112* 113* 112*  K 3.8   < > 3.6 3.6 3.4* 3.4* 3.6 3.5 3.6  CL 79*   < > 82* 84* 81* 83* 81* 83* 82*  CO2 21*   < > 21* 20* 20* 19* 20* 20* 21*  GLUCOSE 114*   < > 102* 116* 104* 92 99 99 104*  BUN 10   < > 9 9 8 8 8 8  7*  CREATININE 0.70   < > 0.66 0.65 0.70 0.63 0.65 0.58* 0.65  ALBUMIN 3.5  --   --   --   --   --   --   --   --   CALCIUM 8.0*   < > 7.8* 7.9* 7.9* 7.8* 7.7* 7.9* 7.9*  AST 34  --   --   --   --   --   --   --   --   ALT 21  --   --   --   --   --   --   --   --    < > = values in this interval not displayed.   Liver Function Tests: Recent Labs  Lab 04/13/20 1244  AST 34  ALT 21  ALKPHOS 25*  BILITOT 1.3*  PROT 5.7*  ALBUMIN 3.5   No results for input(s): LIPASE, AMYLASE in the last 168 hours. No results for input(s): AMMONIA in the last 168 hours. CBC: Recent Labs  Lab 04/13/20 1244 04/13/20 1351 04/14/20 0324 04/14/20 0533 04/14/20 0726  WBC 13.4*  --   --  11.3*  --   NEUTROABS 10.4*  --   --   --   --   HGB 8.5*   <  > 8.0* 8.4* 8.3*  HCT 23.8*   < > 22.8* 23.5* 23.5*  MCV 84.7  --   --  84.2  --   PLT 194  --   --  216  --    < > = values in this interval not displayed.   Cardiac Enzymes: No results for input(s): CKTOTAL, CKMB, CKMBINDEX, TROPONINI in the last 168 hours. CBG: No results for input(s): GLUCAP in the last 168 hours.  Iron Studies:  Recent Labs    04/13/20 1413  IRON 27*  TIBC 262  FERRITIN 181   Studies/Results: CT ABDOMEN PELVIS W CONTRAST  Result Date: 04/13/2020 CLINICAL  DATA:  Extraperitoneal RIGHT inguinal hernia repair 3 days ago utilizing (2) Perfix plug and mesh repair RIGHT inguinal canal, now with RIGHT scrotal swelling and hematoma EXAM: CT ABDOMEN AND PELVIS WITH CONTRAST TECHNIQUE: Multidetector CT imaging of the abdomen and pelvis was performed using the standard protocol following bolus administration of intravenous contrast. Sagittal and coronal MPR images reconstructed from axial data set. CONTRAST:  170mL OMNIPAQUE IOHEXOL 300 MG/ML SOLN IV. No oral contrast. COMPARISON:  None FINDINGS: Lower chest: Bibasilar atelectasis and minimal pleural effusions Hepatobiliary: Gallbladder and liver normal appearance Pancreas: Normal appearance Spleen: Normal appearance Adrenals/Urinary Tract: Adrenal glands, kidneys, ureters, and bladder normal appearance. Foley catheter within urinary bladder. Stomach/Bowel: Normal appendix. Mildly prominent stool in cecum. Multiple radiodensities within stool in RIGHT colon, uncertain etiology. Stomach and bowel loops otherwise normal appearance. Vascular/Lymphatic: Atherosclerotic calcifications aorta. Aorta normal caliber. No adenopathy. Reproductive: Unremarkable prostate gland and seminal vesicles. Other: Small LEFT inguinal hernia containing fat. Small umbilical hernia containing fat. No free intraperitoneal air or fluid. Scattered subcutaneous edema at the perineum and thighs as well as the lateral aspects of the pelvis. Irregular high  attenuation material within RIGHT scrotal sac extending into distal RIGHT inguinal canal, 8.2 x 8.1 by greater than 6.4 cm, consistent with hematoma. Two adjacent fluid collections containing tiny foci of gas are identified in the RIGHT inguinal canal, may represent the 2 plugs utilize for hernia repair. Surrounding infiltrative changes. Hemorrhage extends from below the plugs into the inguinal canals. Tiny foci of gas are seen within the RIGHT scrotum and within the RIGHT scrotal wall. No recurrent hernia. Musculoskeletal: Osseous structures unremarkable. IMPRESSION: 8.2 x 8.1 x 6.4 cm diameter hematoma in RIGHT scrotal sac extending into distal inguinal canal. Testes are not delineated; if there is clinical concern for testicular torsion, ultrasound would be recommended. Two adjacent fluid collections containing tiny foci of gas are identified in the RIGHT inguinal canal, may represent the 2 plugs utilized for hernia repair though cannot exclude other postoperative collections; if patient develops fever or leukocytosis and there is clinical concern for infection, would recommend repeat CT to exclude abscess and/or fasciitis. No recurrent hernia. Small LEFT inguinal and umbilical hernias containing fat. Bibasilar atelectasis and minimal pleural effusions. Aortic Atherosclerosis (ICD10-I70.0). Electronically Signed   By: Lavonia Dana M.D.   On: 04/13/2020 17:05   DG Chest Portable 1 View  Result Date: 04/13/2020 CLINICAL DATA:  Cough, nausea, weakness, dizziness EXAM: PORTABLE CHEST 1 VIEW COMPARISON:  11/20/2019 chest radiograph. FINDINGS: Stable cardiomediastinal silhouette with normal heart size. No pneumothorax. New small left pleural effusion. No right pleural effusion. Mild indistinct patchy opacities in upper right lung. Mild left basilar scarring versus atelectasis. IMPRESSION: 1. Mild indistinct patchy opacities in the upper right lung, cannot exclude pneumonia. Suggest short-term PA and lateral chest  radiograph follow-up. 2. New small left pleural effusion. Mild left basilar scarring versus atelectasis. Electronically Signed   By: Ilona Sorrel M.D.   On: 04/13/2020 10:18   . latanoprost  1 drop Both Eyes QHS  . levothyroxine  88 mcg Oral Q0600  . mometasone-formoterol  2 puff Inhalation BID    BMET    Component Value Date/Time   NA 112 (LL) 04/14/2020 0726   K 3.6 04/14/2020 0726   CL 82 (L) 04/14/2020 0726   CO2 21 (L) 04/14/2020 0726   GLUCOSE 104 (H) 04/14/2020 0726   BUN 7 (L) 04/14/2020 0726   CREATININE 0.65 04/14/2020 0726   CALCIUM 7.9 (L) 04/14/2020 6767  GFRNONAA >60 04/14/2020 0726   GFRAA >60 04/14/2020 0726   CBC    Component Value Date/Time   WBC 11.3 (H) 04/14/2020 0533   RBC 2.79 (L) 04/14/2020 0533   HGB 8.3 (L) 04/14/2020 0726   HCT 23.5 (L) 04/14/2020 0726   PLT 216 04/14/2020 0533   MCV 84.2 04/14/2020 0533   MCH 30.1 04/14/2020 0533   MCHC 35.7 04/14/2020 0533   RDW 13.4 04/14/2020 0533   LYMPHSABS 0.9 04/13/2020 1244   MONOABS 1.7 (H) 04/13/2020 1244   EOSABS 0.0 04/13/2020 1244   BASOSABS 0.0 04/13/2020 1244    Assessment/Plan: 1.  Severe symptomatic Hyponatremia- most consistent with hypovolemic hyponatremia (volume depletion as well as ABLA in setting of SSRI and ARB therapy).   1. Need to increase rate of IVF's (as he is only +850 ml/24 hours) and follow serum sodium levels closely.  Recommend admission to SDU/ICU given lethargy and somnolence.   2. Repeat sodium levels have been 112-113 and if no improvement will need to start 3% saline at 15 mL/hr and stop normal saline.   3. Hold prozac and losartan for now 4. Goal sodium level is 119 over the next 24 hours 5. Need to follow sodium levels every 2 hours for now.   6. TSH and cortisol normal. Low urine Na, elevated urine Osm compared to serum osm supports volume depletion as cause of hyponatremia. 2. ABLA- hgb dropped from 14.2 to 7.5 with large ecchymoses of scrotum and groin.  Transfuse  prn.  Await surgery to evaluate 3. Constipation and abdominal pain- CT scan without evidence of ileus or obstruction.  Will need laxatives and stool softeners per primary. 4. Inguinal hernia repair- with hematoma/ecchymoses per surgery 5. HTN- hold losartan for now 6. Hypothyroidism- TSH WNL 7. GERD- on PPI  Donetta Potts, MD [employer] [phone]

## 2020-04-14 NOTE — Progress Notes (Signed)
Patient Demographics:    Jeffrey Gordon, is a 64 y.o. male, DOB - 1956/08/11, KLK:[id number]  Admit date - 04/13/2020   Admitting Physician Pratik Darleen Crocker, DO  Outpatient Primary MD for the patient is Celene Squibb, MD  LOS - 1   Chief Complaint  Patient presents with  . Weakness        Subjective:    Jeffrey Gordon today has no fevers, no emesis,  No chest pain,   -Nausea persist, -Lethargic -Wife at bedside, questions answered  Assessment  & Plan :    Principal Problem:   Hyponatremia Active Problems:   Postoperative anemia due to acute blood loss   HTN (hypertension)   Depression  Brief Summary:-  64 y.o. male with medical history significant for COPD, hypothyroidism, GERD, hypertension, and anxiety/depression who has undergone recent right inguinal hernia repair on 04/10/2020 admitted on 2020-04-13 with acute severe symptomatic hyponatremia and acute blood loss anemia -Admitted with a sodium of 109 and hgb of 8.5 -Hyponatremia persisted despite Normal saline infusions patient has been transferred to stepdown unit for 3% saline infusion  A/p 1)Severe symptomatic hypovolemic hyponatremia--- suspect volume depletion/dehydration related--- -Patient has lethargy and somnolence -TSH and cortisol WNL -Prozac and losartan discontinued -Failed normal saline, okay to start 3% saline at 37ml/ hour with frequent sodium levels checking  2) acute symptomatic blood loss anemia--post right groin/hernia surgery on 04/10/20--- this is  postoperative acute blood loss -Work-up consistent with iron deficiency with serum iron of 27 and iron saturation of 10, ferritin is not low, B12 and folate are not low -Hgb was 14 on 04/03/20 -Hemoglobin down to 8.5 on admission--- Hgb now 7.5 --patient with significant ecchymoses/bruising/hematoma due to right groin and suprapubic area -Monitor H&H closely especially with IV  hydration, transfuse as clinically indicated or if hemoglobin less than 7  3)H/o HTN--losartan and Cardura currently on hold  4)H/o COPD--no acute exacerbation at this time  5) hypothyroidism--stable, TSH is 1.2,  continue levothyroxine  6) depression anxiety--- Prozac on hold due to hyponatremia  7) status post right inguinal hernia repair-- 04/10/20--postop management per general surgeon Dr. Constance Haw  8)Constipation--- Senokot and MiraLAX as ordered  Disposition/Need for in-Hospital Stay- patient unable to be discharged at this time due to --severe symptomatic hyponatremia requiring IV 3% saline drip-frequent BMP checks  Status is: Inpatient  Remains inpatient appropriate because:severe symptomatic hyponatremia requiring IV 3% saline drip-frequent BMP checks   Disposition: The patient is from: Home              Anticipated d/c is to: Home              Anticipated d/c date is: > 3 days              Patient currently is not medically stable to d/c. Barriers: Not Clinically Stable- severe symptomatic hyponatremia requiring IV 3% saline drip-frequent BMP checks   Code Status : full  Family Communication:   (patient is alert, awake and coherent)  Discussed with wife at bedside  Consults  :  Nephrology/Gen surg  DVT Prophylaxis  :   - SCDs  Lab Results  Component Value Date   PLT 216 04/14/2020    Inpatient Medications  Scheduled Meds: . Chlorhexidine  Gluconate Cloth  6 each Topical Daily  . latanoprost  1 drop Both Eyes QHS  . levothyroxine  88 mcg Oral Q0600  . mometasone-formoterol  2 puff Inhalation BID   Continuous Infusions: . sodium chloride (hypertonic)     PRN Meds:.acetaminophen **OR** acetaminophen, ipratropium-albuterol, ondansetron **OR** ondansetron (ZOFRAN) IV, oxyCODONE    Anti-infectives (From admission, onward)   None        Objective:   Vitals:   04/14/20 0843 04/14/20 1445 04/14/20 1500 04/14/20 1515  BP:  (!) 142/75  (!) 148/79  Pulse:   85  91  Resp:  17  19  Temp:      TempSrc:      SpO2: 95% 98% 98% 98%  Weight:      Height:        Wt Readings from Last 3 Encounters:  04/13/20 107.5 kg  04/08/20 105.7 kg  03/17/20 108.4 kg     Intake/Output Summary (Last 24 hours) at 04/14/2020 1533 Last data filed at 04/14/2020 0932 Gross per 24 hour  Intake 1603.83 ml  Output --  Net 1603.83 ml     Physical Exam  Gen:-  somewhat lethargic HEENT:- [place].AT, No sclera icterus Neck-Supple Neck,No JVD,.  Lungs-diminished in bases, no wheezing CV- S1, S2 normal, regular  Abd-  +ve B.Sounds, Abd Soft, No tenderness,    Extremity/Skin:- No  edema, pedal pulses present  Psych-affect is flat, oriented x3 Neuro-denies weakness no new focal deficits, no tremors GU-Foley in situ, -- significant scrotal, supra pubic and right groin area swelling and ecchymosis   Data Review:   Micro Results Recent Results (from the past 240 hour(s))  SARS CORONAVIRUS 2 (TAT 6-24 HRS) Nasopharyngeal Nasopharyngeal Swab     Status: None   Collection Time: 04/08/20  3:17 PM   Specimen: Nasopharyngeal Swab  Result Value Ref Range Status   SARS Coronavirus 2 NEGATIVE NEGATIVE Final    Comment: (NOTE) SARS-CoV-2 target nucleic acids are NOT DETECTED.  The SARS-CoV-2 RNA is generally detectable in upper and lower respiratory specimens during the acute phase of infection. Negative results do not preclude SARS-CoV-2 infection, do not rule out co-infections with other pathogens, and should not be used as the sole basis for treatment or other patient management decisions. Negative results must be combined with clinical observations, patient history, and epidemiological information. The expected result is Negative.  Fact Sheet for Patients: SugarRoll.be  Fact Sheet for Healthcare Providers: https://www.woods-mathews.com/  This test is not yet approved or cleared by the [place] FDA and  has been  authorized for detection and/or diagnosis of SARS-CoV-2 by FDA under an Emergency Use Authorization (EUA). This EUA will remain  in effect (meaning this test can be used) for the duration of the COVID-19 declaration under Se ction 564(b)(1) of the Act, 21 U.S.C. section 360bbb-3(b)(1), unless the authorization is terminated or revoked sooner.  Performed at [facility], [place] [address] [place], [place] [address]   SARS Coronavirus 2 by RT PCR (hospital order, performed in [facility] hospital lab) Nasopharyngeal Nasopharyngeal Swab     Status: None   Collection Time: 04/13/20  9:52 AM   Specimen: Nasopharyngeal Swab  Result Value Ref Range Status   SARS Coronavirus 2 NEGATIVE NEGATIVE Final    Comment: (NOTE) SARS-CoV-2 target nucleic acids are NOT DETECTED.  The SARS-CoV-2 RNA is generally detectable in upper and lower respiratory specimens during the acute phase of infection. The lowest concentration of SARS-CoV-2 viral copies this assay can  detect is 250 copies / mL. A negative result does not preclude SARS-CoV-2 infection and should not be used as the sole basis for treatment or other patient management decisions.  A negative result may occur with improper specimen collection / handling, submission of specimen other than nasopharyngeal swab, presence of viral mutation(s) within the areas targeted by this assay, and inadequate number of viral copies (<250 copies / mL). A negative result must be combined with clinical observations, patient history, and epidemiological information.  Fact Sheet for Patients:   StrictlyIdeas.no  Fact Sheet for Healthcare Providers: BankingDealers.co.za  This test is not yet approved or  cleared by the [place] FDA and has been authorized for detection and/or diagnosis of SARS-CoV-2 by FDA under an Emergency Use Authorization (EUA).  This EUA will remain in effect (meaning this test can be  used) for the duration of the COVID-19 declaration under Section 564(b)(1) of the Act, 21 U.S.C. section 360bbb-3(b)(1), unless the authorization is terminated or revoked sooner.  Performed at [facility], [address] [place], [place] [address]   Blood culture (routine x 2)     Status: None (Preliminary result)   Collection Time: 04/13/20 12:45 PM   Specimen: BLOOD  Result Value Ref Range Status   Specimen Description BLOOD RIGHT ANTECUBITAL  Final   Special Requests   Final    BOTTLES DRAWN AEROBIC AND ANAEROBIC Blood Culture adequate volume   Culture   Final    NO GROWTH < 24 HOURS Performed at [facility], [address], [place], [place] [address]    Report Status PENDING  Incomplete  Blood culture (routine x 2)     Status: None (Preliminary result)   Collection Time: 04/13/20 12:46 PM   Specimen: BLOOD RIGHT HAND  Result Value Ref Range Status   Specimen Description BLOOD RIGHT HAND  Final   Special Requests   Final    BOTTLES DRAWN AEROBIC AND ANAEROBIC Blood Culture adequate volume   Culture   Final    NO GROWTH < 24 HOURS Performed at [facility], [address], [place], [place] [address]    Report Status PENDING  Incomplete    Radiology Reports CT ABDOMEN PELVIS W CONTRAST  Result Date: 04/13/2020 CLINICAL DATA:  Extraperitoneal RIGHT inguinal hernia repair 3 days ago utilizing (2) Perfix plug and mesh repair RIGHT inguinal canal, now with RIGHT scrotal swelling and hematoma EXAM: CT ABDOMEN AND PELVIS WITH CONTRAST TECHNIQUE: Multidetector CT imaging of the abdomen and pelvis was performed using the standard protocol following bolus administration of intravenous contrast. Sagittal and coronal MPR images reconstructed from axial data set. CONTRAST:  136mL OMNIPAQUE IOHEXOL 300 MG/ML SOLN IV. No oral contrast. COMPARISON:  None FINDINGS: Lower chest: Bibasilar atelectasis and minimal pleural effusions Hepatobiliary: Gallbladder and liver normal appearance Pancreas:  Normal appearance Spleen: Normal appearance Adrenals/Urinary Tract: Adrenal glands, kidneys, ureters, and bladder normal appearance. Foley catheter within urinary bladder. Stomach/Bowel: Normal appendix. Mildly prominent stool in cecum. Multiple radiodensities within stool in RIGHT colon, uncertain etiology. Stomach and bowel loops otherwise normal appearance. Vascular/Lymphatic: Atherosclerotic calcifications aorta. Aorta normal caliber. No adenopathy. Reproductive: Unremarkable prostate gland and seminal vesicles. Other: Small LEFT inguinal hernia containing fat. Small umbilical hernia containing fat. No free intraperitoneal air or fluid. Scattered subcutaneous edema at the perineum and thighs as well as the lateral aspects of the pelvis. Irregular high attenuation material within RIGHT scrotal sac extending into distal RIGHT inguinal canal, 8.2 x 8.1 by greater than 6.4 cm, consistent  with hematoma. Two adjacent fluid collections containing tiny foci of gas are identified in the RIGHT inguinal canal, may represent the 2 plugs utilize for hernia repair. Surrounding infiltrative changes. Hemorrhage extends from below the plugs into the inguinal canals. Tiny foci of gas are seen within the RIGHT scrotum and within the RIGHT scrotal wall. No recurrent hernia. Musculoskeletal: Osseous structures unremarkable. IMPRESSION: 8.2 x 8.1 x 6.4 cm diameter hematoma in RIGHT scrotal sac extending into distal inguinal canal. Testes are not delineated; if there is clinical concern for testicular torsion, ultrasound would be recommended. Two adjacent fluid collections containing tiny foci of gas are identified in the RIGHT inguinal canal, may represent the 2 plugs utilized for hernia repair though cannot exclude other postoperative collections; if patient develops fever or leukocytosis and there is clinical concern for infection, would recommend repeat CT to exclude abscess and/or fasciitis. No recurrent hernia. Small LEFT  inguinal and umbilical hernias containing fat. Bibasilar atelectasis and minimal pleural effusions. Aortic Atherosclerosis (ICD10-I70.0). Electronically Signed   By: Lavonia Dana M.D.   On: 04/13/2020 17:05   DG Chest Portable 1 View  Result Date: 04/13/2020 CLINICAL DATA:  Cough, nausea, weakness, dizziness EXAM: PORTABLE CHEST 1 VIEW COMPARISON:  11/20/2019 chest radiograph. FINDINGS: Stable cardiomediastinal silhouette with normal heart size. No pneumothorax. New small left pleural effusion. No right pleural effusion. Mild indistinct patchy opacities in upper right lung. Mild left basilar scarring versus atelectasis. IMPRESSION: 1. Mild indistinct patchy opacities in the upper right lung, cannot exclude pneumonia. Suggest short-term PA and lateral chest radiograph follow-up. 2. New small left pleural effusion. Mild left basilar scarring versus atelectasis. Electronically Signed   By: Ilona Sorrel M.D.   On: 04/13/2020 10:18     CBC Recent Labs  Lab 04/13/20 1244 04/13/20 1351 04/14/20 0324 04/14/20 0533 04/14/20 0726 04/14/20 0938 04/14/20 1344  WBC 13.4*  --   --  11.3*  --   --   --   HGB 8.5*   < > 8.0* 8.4* 8.3* 8.0* 7.9*  HCT 23.8*   < > 22.8* 23.5* 23.5* 22.7* 22.1*  PLT 194  --   --  216  --   --   --   MCV 84.7  --   --  84.2  --   --   --   MCH 30.2  --   --  30.1  --   --   --   MCHC 35.7  --   --  35.7  --   --   --   RDW 13.5  --   --  13.4  --   --   --   LYMPHSABS 0.9  --   --   --   --   --   --   MONOABS 1.7*  --   --   --   --   --   --   EOSABS 0.0  --   --   --   --   --   --   BASOSABS 0.0  --   --   --   --   --   --    < > = values in this interval not displayed.    Chemistries  Recent Labs  Lab 04/13/20 1244 04/13/20 1351 04/13/20 2204 04/13/20 2204 04/14/20 0102 04/14/20 0102 04/14/20 0324 04/14/20 0533 04/14/20 0726 04/14/20 0938 04/14/20 1344  NA 109*   < > 112*   < > 111*   < > 112* 113*  112* 113* 112*  K 3.8   < > 3.4*  --  3.4*  --  3.6  3.5 3.6  --   --   CL 79*   < > 81*  --  83*  --  81* 83* 82*  --   --   CO2 21*   < > 20*  --  19*  --  20* 20* 21*  --   --   GLUCOSE 114*   < > 104*  --  92  --  99 99 104*  --   --   BUN 10   < > 8  --  8  --  8 8 7*  --   --   CREATININE 0.70   < > 0.70  --  0.63  --  0.65 0.58* 0.65  --   --   CALCIUM 8.0*   < > 7.9*  --  7.8*  --  7.7* 7.9* 7.9*  --   --   MG  --   --   --   --   --   --   --  1.9  --   --   --   AST 34  --   --   --   --   --   --   --   --   --   --   ALT 21  --   --   --   --   --   --   --   --   --   --   ALKPHOS 25*  --   --   --   --   --   --   --   --   --   --   BILITOT 1.3*  --   --   --   --   --   --   --   --   --   --    < > = values in this interval not displayed.   ------------------------------------------------------------------------------------------------------------------ No results for input(s): CHOL, HDL, LDLCALC, TRIG, CHOLHDL, LDLDIRECT in the last 72 hours.  No results found for: HGBA1C ------------------------------------------------------------------------------------------------------------------ Recent Labs    04/13/20 1413  TSH 1.247   ------------------------------------------------------------------------------------------------------------------ Recent Labs    04/13/20 1413  VITAMINB12 279  FOLATE 12.7  FERRITIN 181  TIBC 262  IRON 27*  RETICCTPCT 2.3    Coagulation profile No results for input(s): INR, PROTIME in the last 168 hours.  No results for input(s): DDIMER in the last 72 hours.  Cardiac Enzymes No results for input(s): CKMB, TROPONINI, MYOGLOBIN in the last 168 hours.  Invalid input(s): CK ------------------------------------------------------------------------------------------------------------------ No results found for: BNP   Roxan Hockey M.D on 04/14/2020 at 3:33 PM  Go to [email or web address] - for contact info  [facility] - Office  [phone]

## 2020-04-14 NOTE — Plan of Care (Signed)
CRITICAL VALUE ALERT  Critical Value:  Na 112  Date & Time Notied:  04/14/20 1442  Provider Notified: Dr. Joesph Fillers   Orders Received/Actions taken: see new orders

## 2020-04-14 NOTE — Plan of Care (Signed)
CRITICAL VALUE ALERT  Critical Value:  Na 112  Date & Time Notied:  04/14/20 1610  Provider Notified: Dr. Joesph Fillers aware  Orders Received/Actions taken: Unchanged since previous. 3% being started. See [facility]

## 2020-04-14 NOTE — Progress Notes (Signed)
CRITICAL VALUE ALERT  Critical Value:  Serum Osmolality 240.  Date & Time Notied:  8/31 @ 2091.  Provider Notified: Marval Regal, MD. & Courage, MD.  Orders Received/Actions taken: No new orders. 3% saline is infusing.

## 2020-04-15 LAB — RENAL FUNCTION PANEL
Albumin: 3.3 g/dL — ABNORMAL LOW (ref 3.5–5.0)
Anion gap: 10 (ref 5–15)
BUN: 5 mg/dL — ABNORMAL LOW (ref 8–23)
CO2: 23 mmol/L (ref 22–32)
Calcium: 8.3 mg/dL — ABNORMAL LOW (ref 8.9–10.3)
Chloride: 83 mmol/L — ABNORMAL LOW (ref 98–111)
Creatinine, Ser: 0.52 mg/dL — ABNORMAL LOW (ref 0.61–1.24)
GFR calc Af Amer: >60 mL/min (ref >60)
GFR calc non Af Amer: >60 mL/min (ref >60)
Glucose, Bld: 123 mg/dL — ABNORMAL HIGH (ref 70–99)
Phosphorus: 1.8 mg/dL — ABNORMAL LOW (ref 2.5–4.6)
Potassium: 3.2 mmol/L — ABNORMAL LOW (ref 3.5–5.1)
Sodium: 116 mmol/L — CL (ref 135–145)

## 2020-04-15 LAB — CBC
HCT: 24.3 % — ABNORMAL LOW (ref 39.0–52.0)
Hemoglobin: 8.6 g/dL — ABNORMAL LOW (ref 13.0–17.0)
MCH: 30.6 pg (ref 26.0–34.0)
MCHC: 35.4 g/dL (ref 30.0–36.0)
MCV: 86.5 fL (ref 80.0–100.0)
Platelets: 241 10*3/uL (ref 150–400)
RBC: 2.81 MIL/uL — ABNORMAL LOW (ref 4.22–5.81)
RDW: 13.5 % (ref 11.5–15.5)
WBC: 12.4 10*3/uL — ABNORMAL HIGH (ref 4.0–10.5)
nRBC: 0 % (ref 0.0–0.2)

## 2020-04-15 LAB — SODIUM
Sodium: 115 mmol/L — CL (ref 135–145)
Sodium: 117 mmol/L — CL (ref 135–145)
Sodium: 123 mmol/L — ABNORMAL LOW (ref 135–145)

## 2020-04-15 MED ORDER — SODIUM CHLORIDE 3 % IV SOLN
INTRAVENOUS | Status: DC
  Filled 2020-04-15 (×2): qty 500

## 2020-04-15 MED ORDER — CHLORPROMAZINE HCL 25 MG/ML IJ SOLN
25.0000 mg | Freq: Once | INTRAMUSCULAR | Status: AC
  Administered 2020-04-15: 25 mg via INTRAMUSCULAR
  Filled 2020-04-15: qty 1

## 2020-04-15 MED ORDER — CHLORPROMAZINE HCL 25 MG/ML IJ SOLN
25.0000 mg | Freq: Once | INTRAMUSCULAR | Status: AC
  Administered 2020-04-15: 25 mg via INTRAMUSCULAR
  Filled 2020-04-15 (×2): qty 1

## 2020-04-15 MED ORDER — PHENOL 1.4 % MT LIQD
1.0000 | OROMUCOSAL | Status: DC | PRN
  Administered 2020-04-15: 1 via OROMUCOSAL
  Filled 2020-04-15: qty 177

## 2020-04-15 NOTE — Progress Notes (Signed)
[facility] Progress Note     Subjective: Doing better today. In a bed upstairs out of the ED. Feeling better. Hair combed and glasses on. Still with hiccups. Overall improved. H&H stable. Na finally coming up.   Objective: Vital signs in last 24 hours: Temp:  [97.7 F (36.5 C)-98.8 F (37.1 C)] 97.7 F (36.5 C) (09/01 0000) Pulse Rate:  [73-96] 90 (09/01 1000) Resp:  [14-23] 18 (09/01 1000) BP: (110-169)/(46-88) 113/46 (09/01 1000) SpO2:  [93 %-98 %] 96 % (09/01 1000) Last BM Date: 04/10/20  Intake/Output from previous day: 08/31 0701 - 09/01 0700 In: 2372.1 [I.V.:2372.1] Out: 3200 [Urine:3200] Intake/Output this shift: No intake/output data recorded.  General appearance: alert, cooperative and mild distress with hiccups Resp: normal work of breathing GI: soft, minimaly distended, nontender, right groin incision c/d/i with dermabond, no erythema, non tender, ecchymosis of the right and left groin, scrotum and right thigh all improving, some yellowing at the edges, scrotal swelling improving  Lab Results:  Recent Labs    04/14/20 0533 04/14/20 0726 04/14/20 1344 04/15/20 0432  WBC 11.3*  --   --  12.4*  HGB 8.4*   < > 7.9* 8.6*  HCT 23.5*   < > 22.1* 24.3*  PLT 216  --   --  241   < > = values in this interval not displayed.   BMET Recent Labs    04/14/20 0726 04/14/20 0938 04/14/20 2040 04/15/20 0432  NA 112*   < > 113* 116*  K 3.6  --   --  3.2*  CL 82*  --   --  83*  CO2 21*  --   --  23  GLUCOSE 104*  --   --  123*  BUN 7*  --   --  5*  CREATININE 0.65  --   --  0.52*  CALCIUM 7.9*  --   --  8.3*   < > = values in this interval not displayed.   PT/INR No results for input(s): LABPROT, INR in the last 72 hours.  Studies/Results: CT ABDOMEN PELVIS W CONTRAST  Result Date: 04/13/2020 CLINICAL DATA:  Extraperitoneal RIGHT inguinal hernia repair 3 days ago utilizing (2) Perfix plug and mesh repair RIGHT inguinal canal, now with RIGHT  scrotal swelling and hematoma EXAM: CT ABDOMEN AND PELVIS WITH CONTRAST TECHNIQUE: Multidetector CT imaging of the abdomen and pelvis was performed using the standard protocol following bolus administration of intravenous contrast. Sagittal and coronal MPR images reconstructed from axial data set. CONTRAST:  184mL OMNIPAQUE IOHEXOL 300 MG/ML SOLN IV. No oral contrast. COMPARISON:  None FINDINGS: Lower chest: Bibasilar atelectasis and minimal pleural effusions Hepatobiliary: Gallbladder and liver normal appearance Pancreas: Normal appearance Spleen: Normal appearance Adrenals/Urinary Tract: Adrenal glands, kidneys, ureters, and bladder normal appearance. Foley catheter within urinary bladder. Stomach/Bowel: Normal appendix. Mildly prominent stool in cecum. Multiple radiodensities within stool in RIGHT colon, uncertain etiology. Stomach and bowel loops otherwise normal appearance. Vascular/Lymphatic: Atherosclerotic calcifications aorta. Aorta normal caliber. No adenopathy. Reproductive: Unremarkable prostate gland and seminal vesicles. Other: Small LEFT inguinal hernia containing fat. Small umbilical hernia containing fat. No free intraperitoneal air or fluid. Scattered subcutaneous edema at the perineum and thighs as well as the lateral aspects of the pelvis. Irregular high attenuation material within RIGHT scrotal sac extending into distal RIGHT inguinal canal, 8.2 x 8.1 by greater than 6.4 cm, consistent with hematoma. Two adjacent fluid collections containing tiny foci of gas are identified in the RIGHT inguinal canal, may  represent the 2 plugs utilize for hernia repair. Surrounding infiltrative changes. Hemorrhage extends from below the plugs into the inguinal canals. Tiny foci of gas are seen within the RIGHT scrotum and within the RIGHT scrotal wall. No recurrent hernia. Musculoskeletal: Osseous structures unremarkable. IMPRESSION: 8.2 x 8.1 x 6.4 cm diameter hematoma in RIGHT scrotal sac extending into distal  inguinal canal. Testes are not delineated; if there is clinical concern for testicular torsion, ultrasound would be recommended. Two adjacent fluid collections containing tiny foci of gas are identified in the RIGHT inguinal canal, may represent the 2 plugs utilized for hernia repair though cannot exclude other postoperative collections; if patient develops fever or leukocytosis and there is clinical concern for infection, would recommend repeat CT to exclude abscess and/or fasciitis. No recurrent hernia. Small LEFT inguinal and umbilical hernias containing fat. Bibasilar atelectasis and minimal pleural effusions. Aortic Atherosclerosis (ICD10-I70.0). Electronically Signed   By: Lavonia Dana M.D.   On: 04/13/2020 17:05    Anti-infectives: Anti-infectives (From admission, onward)   None      Assessment/Plan: Mr. Jeffrey Gordon is a 64 yo who hyponatremia and acute post operative anemia from blood loss /hematoma in the the scrotum after right inguinal hernia repair with mesh 8/27. Doing better today. Still with hiccups. H&H stable.  Diet as tolerated, CT without ileus or obstruction, unsure of cause of hiccups ?  Will discuss more compazine today for hiccups H&H stable, ecchymosis evolving, no signs of infection at this time  Mrs. Jeffrey Gordon at bedside and all questions answered.    LOS: 2 days    Virl Cagey 04/15/2020

## 2020-04-15 NOTE — Progress Notes (Signed)
Patient Demographics:    Jeffrey Gordon, is a 63 y.o. male, DOB - Oct 20, 1955, OHF:[id number]  Admit date - 04/13/2020   Admitting Physician Pratik Darleen Crocker, DO  Outpatient Primary MD for the patient is Celene Squibb, MD  LOS - 2   Chief Complaint  Patient presents with  . Weakness        Subjective:    Jeffrey Gordon today has no fevers,   No chest pain,    Pt accidentally pulled out iv saline lock overnight and went hours w/o getting 3 % saline so his sodium is only 116 ---  -Patient is more awake , more coherent -Has nausea, no emesis but having lots of hiccups  Assessment  & Plan :    Principal Problem:   Hyponatremia Active Problems:   Postoperative anemia due to acute blood loss   HTN (hypertension)   Depression  Brief Summary:-  64 y.o. male with medical history significant for COPD, hypothyroidism, GERD, hypertension, and anxiety/depression who has undergone recent right inguinal hernia repair on 04/10/2020 admitted on 2020-04-13 with acute severe symptomatic hyponatremia and acute blood loss anemia -Admitted with a sodium of 109 and hgb of 8.5 -Hyponatremia persisted despite Normal saline infusions patient has been transferred to stepdown unit for 3% saline infusion  A/p 1)Severe symptomatic hypovolemic hyponatremia--- suspect volume depletion/dehydration related--- -Sodium finally trending up with 3% saline solution Na-- 112 >> 113 >> 116 -TSH and cortisol WNL -Prozac and losartan discontinued -Continue serial sodium checks and adjust IV fluids accordingly -Patient more coherent and more awake  2)Acute symptomatic blood loss anemia--post right groin/hernia surgery on 04/10/20--- this is  postoperative acute blood loss -Work-up consistent with iron deficiency with serum iron of 27 and iron saturation of 10, ferritin is not low, B12 and folate are not low -Hgb was 14 on  04/03/20 -Hemoglobin stable at 8.6 --patient with significant ecchymoses/bruising/hematoma due to right groin and suprapubic area -Monitor H&H closely especially with IV hydration, transfuse as clinically indicated or if hemoglobin less than 7  3)H/o HTN--losartan and Cardura currently on hold  4)H/o COPD--no acute exacerbation at this time  5) hypothyroidism--stable, TSH is 1.2,  continue levothyroxine  6) depression anxiety--- Prozac on hold due to hyponatremia  7) status post right inguinal hernia repair-- 04/10/20--postop management per general surgeon Dr. Constance Haw  8)Constipation--- Senokot and MiraLAX as ordered  Disposition/Need for in-Hospital Stay- patient unable to be discharged at this time due to --severe symptomatic hyponatremia requiring IV 3% saline drip-frequent BMP checks  Status is: Inpatient  Remains inpatient appropriate because:severe symptomatic hyponatremia requiring IV 3% saline drip-frequent BMP checks   Disposition: The patient is from: Home              Anticipated d/c is to: Home              Anticipated d/c date is: > 3 days              Patient currently is not medically stable to d/c. Barriers: Not Clinically Stable- severe symptomatic hyponatremia requiring IV 3% saline drip-frequent BMP checks   Code Status : full  Family Communication:   (patient is alert, awake and coherent)  Discussed with wife at bedside  Consults  :  Nephrology/Gen surg  DVT Prophylaxis  :   - SCDs  Lab Results  Component Value Date   PLT 241 04/15/2020    Inpatient Medications  Scheduled Meds: . Chlorhexidine Gluconate Cloth  6 each Topical Daily  . chlorproMAZINE (THORAZINE) injection  25 mg Intramuscular Once  . latanoprost  1 drop Both Eyes QHS  . levothyroxine  88 mcg Oral Q0600  . mometasone-formoterol  2 puff Inhalation BID  . polyethylene glycol  17 g Oral BID  . senna-docusate  2 tablet Oral BID   Continuous Infusions: . sodium chloride (hypertonic)  20 mL/hr at 04/15/20 0655   PRN Meds:.acetaminophen **OR** acetaminophen, ipratropium-albuterol, melatonin, ondansetron **OR** ondansetron (ZOFRAN) IV, oxyCODONE    Anti-infectives (From admission, onward)   None        Objective:   Vitals:   04/15/20 0700 04/15/20 0800 04/15/20 0900 04/15/20 1000  BP: (!) 147/71 (!) 154/88 (!) 142/51 (!) 113/46  Pulse: 83 79 93 90  Resp: 18 14 18 18   Temp:      TempSrc:      SpO2: 98% 97% 97% 96%  Weight:      Height:        Wt Readings from Last 3 Encounters:  04/13/20 107.5 kg  04/08/20 105.7 kg  03/17/20 108.4 kg     Intake/Output Summary (Last 24 hours) at 04/15/2020 1119 Last data filed at 04/15/2020 0655 Gross per 24 hour  Intake 768.24 ml  Output 3200 ml  Net -2431.76 ml     Physical Exam  Gen:-Awake, in no acute distress HEENT:- [place].AT, No sclera icterus Neck-Supple Neck,No JVD,.  Lungs-improving air movement, no wheezing CV- S1, S2 normal, regular  Abd-  +ve B.Sounds, Abd Soft, No tenderness,    Extremity/Skin:- No  edema, pedal pulses present  Psych-affect is flat, oriented x3 Neuro-generalized weakness ,no new focal deficits, no tremors GU-Foley in situ, -- significant scrotal, supra pubic and right groin area swelling and ecchymosis   Data Review:   Micro Results Recent Results (from the past 240 hour(s))  SARS CORONAVIRUS 2 (TAT 6-24 HRS) Nasopharyngeal Nasopharyngeal Swab     Status: None   Collection Time: 04/08/20  3:17 PM   Specimen: Nasopharyngeal Swab  Result Value Ref Range Status   SARS Coronavirus 2 NEGATIVE NEGATIVE Final    Comment: (NOTE) SARS-CoV-2 target nucleic acids are NOT DETECTED.  The SARS-CoV-2 RNA is generally detectable in upper and lower respiratory specimens during the acute phase of infection. Negative results do not preclude SARS-CoV-2 infection, do not rule out co-infections with other pathogens, and should not be used as the sole basis for treatment or other patient management  decisions. Negative results must be combined with clinical observations, patient history, and epidemiological information. The expected result is Negative.  Fact Sheet for Patients: SugarRoll.be  Fact Sheet for Healthcare Providers: https://www.woods-mathews.com/  This test is not yet approved or cleared by the [place] FDA and  has been authorized for detection and/or diagnosis of SARS-CoV-2 by FDA under an Emergency Use Authorization (EUA). This EUA will remain  in effect (meaning this test can be used) for the duration of the COVID-19 declaration under Se ction 564(b)(1) of the Act, 21 U.S.C. section 360bbb-3(b)(1), unless the authorization is terminated or revoked sooner.  Performed at [facility], [place] [address], [place], [place] [address]   SARS Coronavirus 2 by RT PCR (hospital order, performed in [facility] hospital lab) Nasopharyngeal Nasopharyngeal Swab     Status: None  Collection Time: 04/13/20  9:52 AM   Specimen: Nasopharyngeal Swab  Result Value Ref Range Status   SARS Coronavirus 2 NEGATIVE NEGATIVE Final    Comment: (NOTE) SARS-CoV-2 target nucleic acids are NOT DETECTED.  The SARS-CoV-2 RNA is generally detectable in upper and lower respiratory specimens during the acute phase of infection. The lowest concentration of SARS-CoV-2 viral copies this assay can detect is 250 copies / mL. A negative result does not preclude SARS-CoV-2 infection and should not be used as the sole basis for treatment or other patient management decisions.  A negative result may occur with improper specimen collection / handling, submission of specimen other than nasopharyngeal swab, presence of viral mutation(s) within the areas targeted by this assay, and inadequate number of viral copies (<250 copies / mL). A negative result must be combined with clinical observations, patient history, and epidemiological information.  Fact  Sheet for Patients:   StrictlyIdeas.no  Fact Sheet for Healthcare Providers: BankingDealers.co.za  This test is not yet approved or  cleared by the [place] FDA and has been authorized for detection and/or diagnosis of SARS-CoV-2 by FDA under an Emergency Use Authorization (EUA).  This EUA will remain in effect (meaning this test can be used) for the duration of the COVID-19 declaration under Section 564(b)(1) of the Act, 21 U.S.C. section 360bbb-3(b)(1), unless the authorization is terminated or revoked sooner.  Performed at [facility], [address] [place], [place] [address]   Blood culture (routine x 2)     Status: None (Preliminary result)   Collection Time: 04/13/20 12:45 PM   Specimen: BLOOD  Result Value Ref Range Status   Specimen Description BLOOD RIGHT ANTECUBITAL  Final   Special Requests   Final    BOTTLES DRAWN AEROBIC AND ANAEROBIC Blood Culture adequate volume   Culture   Final    NO GROWTH < 24 HOURS Performed at [facility], [address], [place], [place] [address]    Report Status PENDING  Incomplete  Blood culture (routine x 2)     Status: None (Preliminary result)   Collection Time: 04/13/20 12:46 PM   Specimen: BLOOD RIGHT HAND  Result Value Ref Range Status   Specimen Description BLOOD RIGHT HAND  Final   Special Requests   Final    BOTTLES DRAWN AEROBIC AND ANAEROBIC Blood Culture adequate volume   Culture   Final    NO GROWTH < 24 HOURS Performed at [facility], [address] [place], [place] [address]    Report Status PENDING  Incomplete  MRSA PCR Screening     Status: None   Collection Time: 04/14/20  2:51 PM   Specimen: Nasal Mucosa; Nasopharyngeal  Result Value Ref Range Status   MRSA by PCR NEGATIVE NEGATIVE Final    Comment:        The GeneXpert MRSA Assay (FDA approved for NASAL specimens only), is one component of a comprehensive MRSA colonization surveillance program. It is  not intended to diagnose MRSA infection nor to guide or monitor treatment for MRSA infections. Performed at [facility], [address], [place], [place] [address]     Radiology Reports CT ABDOMEN PELVIS W CONTRAST  Result Date: 04/13/2020 CLINICAL DATA:  Extraperitoneal RIGHT inguinal hernia repair 3 days ago utilizing (2) Perfix plug and mesh repair RIGHT inguinal canal, now with RIGHT scrotal swelling and hematoma EXAM: CT ABDOMEN AND PELVIS WITH CONTRAST TECHNIQUE: Multidetector CT imaging of the abdomen and pelvis was performed using the standard protocol following  bolus administration of intravenous contrast. Sagittal and coronal MPR images reconstructed from axial data set. CONTRAST:  175mL OMNIPAQUE IOHEXOL 300 MG/ML SOLN IV. No oral contrast. COMPARISON:  None FINDINGS: Lower chest: Bibasilar atelectasis and minimal pleural effusions Hepatobiliary: Gallbladder and liver normal appearance Pancreas: Normal appearance Spleen: Normal appearance Adrenals/Urinary Tract: Adrenal glands, kidneys, ureters, and bladder normal appearance. Foley catheter within urinary bladder. Stomach/Bowel: Normal appendix. Mildly prominent stool in cecum. Multiple radiodensities within stool in RIGHT colon, uncertain etiology. Stomach and bowel loops otherwise normal appearance. Vascular/Lymphatic: Atherosclerotic calcifications aorta. Aorta normal caliber. No adenopathy. Reproductive: Unremarkable prostate gland and seminal vesicles. Other: Small LEFT inguinal hernia containing fat. Small umbilical hernia containing fat. No free intraperitoneal air or fluid. Scattered subcutaneous edema at the perineum and thighs as well as the lateral aspects of the pelvis. Irregular high attenuation material within RIGHT scrotal sac extending into distal RIGHT inguinal canal, 8.2 x 8.1 by greater than 6.4 cm, consistent with hematoma. Two adjacent fluid collections containing tiny foci of gas are identified in the RIGHT inguinal  canal, may represent the 2 plugs utilize for hernia repair. Surrounding infiltrative changes. Hemorrhage extends from below the plugs into the inguinal canals. Tiny foci of gas are seen within the RIGHT scrotum and within the RIGHT scrotal wall. No recurrent hernia. Musculoskeletal: Osseous structures unremarkable. IMPRESSION: 8.2 x 8.1 x 6.4 cm diameter hematoma in RIGHT scrotal sac extending into distal inguinal canal. Testes are not delineated; if there is clinical concern for testicular torsion, ultrasound would be recommended. Two adjacent fluid collections containing tiny foci of gas are identified in the RIGHT inguinal canal, may represent the 2 plugs utilized for hernia repair though cannot exclude other postoperative collections; if patient develops fever or leukocytosis and there is clinical concern for infection, would recommend repeat CT to exclude abscess and/or fasciitis. No recurrent hernia. Small LEFT inguinal and umbilical hernias containing fat. Bibasilar atelectasis and minimal pleural effusions. Aortic Atherosclerosis (ICD10-I70.0). Electronically Signed   By: Lavonia Dana M.D.   On: 04/13/2020 17:05   DG Chest Portable 1 View  Result Date: 04/13/2020 CLINICAL DATA:  Cough, nausea, weakness, dizziness EXAM: PORTABLE CHEST 1 VIEW COMPARISON:  11/20/2019 chest radiograph. FINDINGS: Stable cardiomediastinal silhouette with normal heart size. No pneumothorax. New small left pleural effusion. No right pleural effusion. Mild indistinct patchy opacities in upper right lung. Mild left basilar scarring versus atelectasis. IMPRESSION: 1. Mild indistinct patchy opacities in the upper right lung, cannot exclude pneumonia. Suggest short-term PA and lateral chest radiograph follow-up. 2. New small left pleural effusion. Mild left basilar scarring versus atelectasis. Electronically Signed   By: Ilona Sorrel M.D.   On: 04/13/2020 10:18     CBC Recent Labs  Lab 04/13/20 1244 04/13/20 1351 04/14/20 0533  04/14/20 0726 04/14/20 0938 04/14/20 1344 04/15/20 0432  WBC 13.4*  --  11.3*  --   --   --  12.4*  HGB 8.5*   < > 8.4* 8.3* 8.0* 7.9* 8.6*  HCT 23.8*   < > 23.5* 23.5* 22.7* 22.1* 24.3*  PLT 194  --  216  --   --   --  241  MCV 84.7  --  84.2  --   --   --  86.5  MCH 30.2  --  30.1  --   --   --  30.6  MCHC 35.7  --  35.7  --   --   --  35.4  RDW 13.5  --  13.4  --   --   --  13.5  LYMPHSABS 0.9  --   --   --   --   --   --   MONOABS 1.7*  --   --   --   --   --   --   EOSABS 0.0  --   --   --   --   --   --   BASOSABS 0.0  --   --   --   --   --   --    < > = values in this interval not displayed.    Chemistries  Recent Labs  Lab 04/13/20 1244 04/13/20 1351 04/14/20 0102 04/14/20 0102 04/14/20 0324 04/14/20 0324 04/14/20 0533 04/14/20 0533 04/14/20 0726 04/14/20 0938 04/14/20 1344 04/14/20 1509 04/14/20 1640 04/14/20 2040 04/15/20 0432  NA 109*   < > 111*   < > 112*   < > 113*   < > 112*   < > 112* 112* 113* 113* 116*  K 3.8   < > 3.4*  --  3.6  --  3.5  --  3.6  --   --   --   --   --  3.2*  CL 79*   < > 83*  --  81*  --  83*  --  82*  --   --   --   --   --  83*  CO2 21*   < > 19*  --  20*  --  20*  --  21*  --   --   --   --   --  23  GLUCOSE 114*   < > 92  --  99  --  99  --  104*  --   --   --   --   --  123*  BUN 10   < > 8  --  8  --  8  --  7*  --   --   --   --   --  5*  CREATININE 0.70   < > 0.63  --  0.65  --  0.58*  --  0.65  --   --   --   --   --  0.52*  CALCIUM 8.0*   < > 7.8*  --  7.7*  --  7.9*  --  7.9*  --   --   --   --   --  8.3*  MG  --   --   --   --   --   --  1.9  --   --   --   --   --   --   --   --   AST 34  --   --   --   --   --   --   --   --   --   --   --   --   --   --   ALT 21  --   --   --   --   --   --   --   --   --   --   --   --   --   --   ALKPHOS 25*  --   --   --   --   --   --   --   --   --   --   --   --   --   --  BILITOT 1.3*  --   --   --   --   --   --   --   --   --   --   --   --   --   --    < > = values in  this interval not displayed.   ------------------------------------------------------------------------------------------------------------------ No results for input(s): CHOL, HDL, LDLCALC, TRIG, CHOLHDL, LDLDIRECT in the last 72 hours.  No results found for: HGBA1C ------------------------------------------------------------------------------------------------------------------ Recent Labs    04/13/20 1413  TSH 1.247   ------------------------------------------------------------------------------------------------------------------ Recent Labs    04/13/20 1413  VITAMINB12 279  FOLATE 12.7  FERRITIN 181  TIBC 262  IRON 27*  RETICCTPCT 2.3    Coagulation profile No results for input(s): INR, PROTIME in the last 168 hours.  No results for input(s): DDIMER in the last 72 hours.  Cardiac Enzymes No results for input(s): CKMB, TROPONINI, MYOGLOBIN in the last 168 hours.  Invalid input(s): CK ------------------------------------------------------------------------------------------------------------------ No results found for: BNP   Roxan Hockey M.D on 04/15/2020 at 11:19 AM  Go to [email or web address] - for contact info  [facility] - Office  [phone]

## 2020-04-15 NOTE — Progress Notes (Signed)
CRITICAL VALUE ALERT  Critical Value:  NA 116  Date & Time Notied:  04/15/2020 0600  Provider Notified: Acheampong  Orders Received/Actions taken: continue 3% saline

## 2020-04-15 NOTE — Progress Notes (Signed)
Upon morning rounds while waking pt up and checking pt noticed the patient was flipped on his side and had pulled out IV so pt had not been receiving 3% saline from whenever IV was pulled out. New IV restarted and 3% saline restarted. Morning sodium lab will most likely still be low. Awaiting results and will page with results

## 2020-04-15 NOTE — Progress Notes (Signed)
Patient ID: Jeffrey Gordon, male   DOB: 26-Oct-1955, 64 y.o.   MRN: [id number] S: Became confused overnight and pulled out his IV around 5 am so unclear how much 3% saline he received.  Feels better this morning. O:BP (!) 113/46   Pulse 90   Temp 97.7 F (36.5 C) (Oral)   Resp 18   Ht 6\' 3"  (1.905 m)   Wt 107.5 kg   SpO2 96%   BMI 29.62 kg/m   Intake/Output Summary (Last 24 hours) at 04/15/2020 1027 Last data filed at 04/15/2020 0655 Gross per 24 hour  Intake 768.24 ml  Output 3200 ml  Net -2431.76 ml   Intake/Output: I/O last 3 completed shifts: In: 2372.1 [I.V.:2372.1] Out: 3200 [Urine:3200]  Intake/Output this shift:  No intake/output data recorded. Weight change:  Gen: NAD, slowed mentation but more awake and alert CVS: no rub Resp: cta Abd: +BS, soft NT/ND Ext: no edema, resolving hematoma in groin and scrotum.  Recent Labs  Lab 04/13/20 1244 04/13/20 1351 04/13/20 2017 04/13/20 2017 04/13/20 2204 04/13/20 2204 04/14/20 0102 04/14/20 0102 04/14/20 0324 04/14/20 0324 04/14/20 0533 04/14/20 0533 04/14/20 0726 04/14/20 0938 04/14/20 1344 04/14/20 1509 04/14/20 1640 04/14/20 2040 04/15/20 0432  NA 109*   < > 113*   < > 112*   < > 111*   < > 112*   < > 113*   < > 112* 113* 112* 112* 113* 113* 116*  K 3.8   < > 3.6  --  3.4*  --  3.4*  --  3.6  --  3.5  --  3.6  --   --   --   --   --  3.2*  CL 79*   < > 84*  --  81*  --  83*  --  81*  --  83*  --  82*  --   --   --   --   --  83*  CO2 21*   < > 20*  --  20*  --  19*  --  20*  --  20*  --  21*  --   --   --   --   --  23  GLUCOSE 114*   < > 116*  --  104*  --  92  --  99  --  99  --  104*  --   --   --   --   --  123*  BUN 10   < > 9  --  8  --  8  --  8  --  8  --  7*  --   --   --   --   --  5*  CREATININE 0.70   < > 0.65  --  0.70  --  0.63  --  0.65  --  0.58*  --  0.65  --   --   --   --   --  0.52*  ALBUMIN 3.5  --   --   --   --   --   --   --   --   --   --   --   --   --   --   --   --   --  3.3*   CALCIUM 8.0*   < > 7.9*  --  7.9*  --  7.8*  --  7.7*  --  7.9*  --  7.9*  --   --   --   --   --  8.3*  PHOS  --   --   --   --   --   --   --   --   --   --   --   --   --   --   --   --   --   --  1.8*  AST 34  --   --   --   --   --   --   --   --   --   --   --   --   --   --   --   --   --   --   ALT 21  --   --   --   --   --   --   --   --   --   --   --   --   --   --   --   --   --   --    < > = values in this interval not displayed.   Liver Function Tests: Recent Labs  Lab 04/13/20 1244 04/15/20 0432  AST 34  --   ALT 21  --   ALKPHOS 25*  --   BILITOT 1.3*  --   PROT 5.7*  --   ALBUMIN 3.5 3.3*   No results for input(s): LIPASE, AMYLASE in the last 168 hours. No results for input(s): AMMONIA in the last 168 hours. CBC: Recent Labs  Lab 04/13/20 1244 04/13/20 1351 04/14/20 0533 04/14/20 0726 04/14/20 0938 04/14/20 1344 04/15/20 0432  WBC 13.4*  --  11.3*  --   --   --  12.4*  NEUTROABS 10.4*  --   --   --   --   --   --   HGB 8.5*   < > 8.4*   < > 8.0* 7.9* 8.6*  HCT 23.8*   < > 23.5*   < > 22.7* 22.1* 24.3*  MCV 84.7  --  84.2  --   --   --  86.5  PLT 194  --  216  --   --   --  241   < > = values in this interval not displayed.   Cardiac Enzymes: No results for input(s): CKTOTAL, CKMB, CKMBINDEX, TROPONINI in the last 168 hours. CBG: No results for input(s): GLUCAP in the last 168 hours.  Iron Studies:  Recent Labs    04/13/20 1413  IRON 27*  TIBC 262  FERRITIN 181   Studies/Results: CT ABDOMEN PELVIS W CONTRAST  Result Date: 04/13/2020 CLINICAL DATA:  Extraperitoneal RIGHT inguinal hernia repair 3 days ago utilizing (2) Perfix plug and mesh repair RIGHT inguinal canal, now with RIGHT scrotal swelling and hematoma EXAM: CT ABDOMEN AND PELVIS WITH CONTRAST TECHNIQUE: Multidetector CT imaging of the abdomen and pelvis was performed using the standard protocol following bolus administration of intravenous contrast. Sagittal and coronal MPR images  reconstructed from axial data set. CONTRAST:  127mL OMNIPAQUE IOHEXOL 300 MG/ML SOLN IV. No oral contrast. COMPARISON:  None FINDINGS: Lower chest: Bibasilar atelectasis and minimal pleural effusions Hepatobiliary: Gallbladder and liver normal appearance Pancreas: Normal appearance Spleen: Normal appearance Adrenals/Urinary Tract: Adrenal glands, kidneys, ureters, and bladder normal appearance. Foley catheter within urinary bladder. Stomach/Bowel: Normal appendix. Mildly prominent stool in cecum. Multiple radiodensities within stool in RIGHT colon, uncertain etiology. Stomach and bowel loops otherwise normal appearance. Vascular/Lymphatic: Atherosclerotic calcifications aorta. Aorta normal caliber. No adenopathy. Reproductive: Unremarkable prostate gland  and seminal vesicles. Other: Small LEFT inguinal hernia containing fat. Small umbilical hernia containing fat. No free intraperitoneal air or fluid. Scattered subcutaneous edema at the perineum and thighs as well as the lateral aspects of the pelvis. Irregular high attenuation material within RIGHT scrotal sac extending into distal RIGHT inguinal canal, 8.2 x 8.1 by greater than 6.4 cm, consistent with hematoma. Two adjacent fluid collections containing tiny foci of gas are identified in the RIGHT inguinal canal, may represent the 2 plugs utilize for hernia repair. Surrounding infiltrative changes. Hemorrhage extends from below the plugs into the inguinal canals. Tiny foci of gas are seen within the RIGHT scrotum and within the RIGHT scrotal wall. No recurrent hernia. Musculoskeletal: Osseous structures unremarkable. IMPRESSION: 8.2 x 8.1 x 6.4 cm diameter hematoma in RIGHT scrotal sac extending into distal inguinal canal. Testes are not delineated; if there is clinical concern for testicular torsion, ultrasound would be recommended. Two adjacent fluid collections containing tiny foci of gas are identified in the RIGHT inguinal canal, may represent the 2 plugs  utilized for hernia repair though cannot exclude other postoperative collections; if patient develops fever or leukocytosis and there is clinical concern for infection, would recommend repeat CT to exclude abscess and/or fasciitis. No recurrent hernia. Small LEFT inguinal and umbilical hernias containing fat. Bibasilar atelectasis and minimal pleural effusions. Aortic Atherosclerosis (ICD10-I70.0). Electronically Signed   By: Lavonia Dana M.D.   On: 04/13/2020 17:05   . Chlorhexidine Gluconate Cloth  6 each Topical Daily  . latanoprost  1 drop Both Eyes QHS  . levothyroxine  88 mcg Oral Q0600  . mometasone-formoterol  2 puff Inhalation BID  . polyethylene glycol  17 g Oral BID  . senna-docusate  2 tablet Oral BID    BMET    Component Value Date/Time   NA 116 (LL) 04/15/2020 0432   K 3.2 (L) 04/15/2020 0432   CL 83 (L) 04/15/2020 0432   CO2 23 04/15/2020 0432   GLUCOSE 123 (H) 04/15/2020 0432   BUN 5 (L) 04/15/2020 0432   CREATININE 0.52 (L) 04/15/2020 0432   CALCIUM 8.3 (L) 04/15/2020 0432   GFRNONAA >60 04/15/2020 0432   GFRAA >60 04/15/2020 0432   CBC    Component Value Date/Time   WBC 12.4 (H) 04/15/2020 0432   RBC 2.81 (L) 04/15/2020 0432   HGB 8.6 (L) 04/15/2020 0432   HCT 24.3 (L) 04/15/2020 0432   PLT 241 04/15/2020 0432   MCV 86.5 04/15/2020 0432   MCH 30.6 04/15/2020 0432   MCHC 35.4 04/15/2020 0432   RDW 13.5 04/15/2020 0432   LYMPHSABS 0.9 04/13/2020 1244   MONOABS 1.7 (H) 04/13/2020 1244   EOSABS 0.0 04/13/2020 1244   BASOSABS 0.0 04/13/2020 1244    Assessment/Plan: 1. Severe symptomatic Hyponatremia- most consistent with hypovolemic hyponatremia (volume depletion as well as ABLA in setting of SSRI and ARB therapy).  1. Na dropped to 112 so was started on 3% saline at 15 mL/hr but unfortunately he pulled out his IV sometime during the night.  Restarted later this morning but prior to resuming his sodium increased to 116.  2. Continue to hold prozac and  losartan for now 3. Goal sodium of 125 (over 24 hours) and then stop 3% saline 4. Need to follow sodium levels every 4 hours for now per order set (unfortunately none drawn in 6 hours).  5. TSH and cortisol normal. Low urine Na, elevated urine Osm compared to serum osm supports volume depletion as cause of  hyponatremia. 2. ABLA- hgb dropped from 14.2 to 7.5 with large ecchymoses of scrotum and groin. Transfuse prn. Await surgery to evaluate 3. Constipation and abdominal pain- CT scan without evidence of ileus or obstruction.  Will need laxatives and stool softeners per primary. 4. Inguinal hernia repair- with hematoma/ecchymoses per surgery 5. HTN- hold losartan for now 6. Hypothyroidism- TSH WNL 7. GERD- on PPI  Donetta Potts, MD [employer] [phone]

## 2020-04-16 ENCOUNTER — Encounter (HOSPITAL_COMMUNITY): Payer: Self-pay | Admitting: Internal Medicine

## 2020-04-16 ENCOUNTER — Other Ambulatory Visit: Payer: Self-pay

## 2020-04-16 LAB — BASIC METABOLIC PANEL
Anion gap: 9 (ref 5–15)
BUN: 12 mg/dL (ref 8–23)
CO2: 25 mmol/L (ref 22–32)
Calcium: 8.2 mg/dL — ABNORMAL LOW (ref 8.9–10.3)
Chloride: 93 mmol/L — ABNORMAL LOW (ref 98–111)
Creatinine, Ser: 0.72 mg/dL (ref 0.61–1.24)
GFR calc Af Amer: >60 mL/min (ref >60)
GFR calc non Af Amer: >60 mL/min (ref >60)
Glucose, Bld: 196 mg/dL — ABNORMAL HIGH (ref 70–99)
Potassium: 3.2 mmol/L — ABNORMAL LOW (ref 3.5–5.1)
Sodium: 127 mmol/L — ABNORMAL LOW (ref 135–145)

## 2020-04-16 LAB — RENAL FUNCTION PANEL
Albumin: 3.1 g/dL — ABNORMAL LOW (ref 3.5–5.0)
Anion gap: 10 (ref 5–15)
BUN: 8 mg/dL (ref 8–23)
CO2: 24 mmol/L (ref 22–32)
Calcium: 8.2 mg/dL — ABNORMAL LOW (ref 8.9–10.3)
Chloride: 93 mmol/L — ABNORMAL LOW (ref 98–111)
Creatinine, Ser: 0.66 mg/dL (ref 0.61–1.24)
GFR calc Af Amer: >60 mL/min (ref >60)
GFR calc non Af Amer: >60 mL/min (ref >60)
Glucose, Bld: 110 mg/dL — ABNORMAL HIGH (ref 70–99)
Phosphorus: 2.9 mg/dL (ref 2.5–4.6)
Potassium: 4 mmol/L (ref 3.5–5.1)
Sodium: 127 mmol/L — ABNORMAL LOW (ref 135–145)

## 2020-04-16 LAB — CBC
HCT: 26.2 % — ABNORMAL LOW (ref 39.0–52.0)
Hemoglobin: 8.8 g/dL — ABNORMAL LOW (ref 13.0–17.0)
MCH: 30 pg (ref 26.0–34.0)
MCHC: 33.6 g/dL (ref 30.0–36.0)
MCV: 89.4 fL (ref 80.0–100.0)
Platelets: 313 10*3/uL (ref 150–400)
RBC: 2.93 MIL/uL — ABNORMAL LOW (ref 4.22–5.81)
RDW: 14.4 % (ref 11.5–15.5)
WBC: 11.7 10*3/uL — ABNORMAL HIGH (ref 4.0–10.5)
nRBC: 0.2 % (ref 0.0–0.2)

## 2020-04-16 LAB — SODIUM
Sodium: 125 mmol/L — ABNORMAL LOW (ref 135–145)
Sodium: 126 mmol/L — ABNORMAL LOW (ref 135–145)

## 2020-04-16 LAB — MAGNESIUM: Magnesium: 2.2 mg/dL (ref 1.7–2.4)

## 2020-04-16 LAB — POTASSIUM: Potassium: 3.6 mmol/L (ref 3.5–5.1)

## 2020-04-16 LAB — PHOSPHORUS: Phosphorus: 2.3 mg/dL — ABNORMAL LOW (ref 2.5–4.6)

## 2020-04-16 MED ORDER — PANTOPRAZOLE SODIUM 40 MG PO TBEC
40.0000 mg | DELAYED_RELEASE_TABLET | Freq: Every day | ORAL | Status: DC
  Administered 2020-04-17: 40 mg via ORAL
  Filled 2020-04-16: qty 1

## 2020-04-16 MED ORDER — K PHOS MONO-SOD PHOS DI & MONO 155-852-130 MG PO TABS
500.0000 mg | ORAL_TABLET | Freq: Three times a day (TID) | ORAL | Status: AC
  Administered 2020-04-16 (×3): 500 mg via ORAL
  Filled 2020-04-16 (×3): qty 2

## 2020-04-16 MED ORDER — TAMSULOSIN HCL 0.4 MG PO CAPS: 0.4000 mg | ORAL_CAPSULE | Freq: Two times a day (BID) | ORAL | Status: DC

## 2020-04-16 MED ORDER — METOPROLOL TARTRATE 5 MG/5ML IV SOLN
INTRAVENOUS | Status: AC
  Filled 2020-04-16: qty 5

## 2020-04-16 MED ORDER — METOCLOPRAMIDE HCL 5 MG/ML IJ SOLN
10.0000 mg | Freq: Once | INTRAMUSCULAR | Status: AC
  Administered 2020-04-16: 10 mg via INTRAVENOUS
  Filled 2020-04-16: qty 2

## 2020-04-16 MED ORDER — POTASSIUM CHLORIDE CRYS ER 10 MEQ PO TBCR
10.0000 meq | EXTENDED_RELEASE_TABLET | Freq: Once | ORAL | Status: AC
  Administered 2020-04-16: 10 meq via ORAL
  Filled 2020-04-16: qty 1

## 2020-04-16 MED ORDER — MAGNESIUM SULFATE 2 GM/50ML IV SOLN
2.0000 g | Freq: Once | INTRAVENOUS | Status: AC
  Administered 2020-04-16: 2 g via INTRAVENOUS
  Filled 2020-04-16: qty 50

## 2020-04-16 MED ORDER — TAMSULOSIN HCL 0.4 MG PO CAPS
0.4000 mg | ORAL_CAPSULE | Freq: Two times a day (BID) | ORAL | Status: DC
  Administered 2020-04-16 – 2020-04-17 (×3): 0.4 mg via ORAL
  Filled 2020-04-16 (×3): qty 1

## 2020-04-16 MED ORDER — PANTOPRAZOLE SODIUM 40 MG IV SOLR: 40.0000 mg | INTRAVENOUS | Status: DC

## 2020-04-16 MED ORDER — METOPROLOL TARTRATE 5 MG/5ML IV SOLN
5.0000 mg | Freq: Once | INTRAVENOUS | Status: AC
  Administered 2020-04-16: 5 mg via INTRAVENOUS

## 2020-04-16 MED ORDER — PANTOPRAZOLE SODIUM 40 MG IV SOLR
40.0000 mg | Freq: Once | INTRAVENOUS | Status: AC
  Administered 2020-04-16: 40 mg via INTRAVENOUS
  Filled 2020-04-16: qty 40

## 2020-04-16 NOTE — Progress Notes (Signed)
[facility] night shift.  The staff reported that the patient became tachycardic in the 120s to 140s and sustained the heart rate for at least 3 minutes.  He denies any chest discomfort, was not dyspneic or complaining of palpitations.  His losartan and doxazosin were held on admission. His most recent blood pressure was 159/63 mmHg with a heart rate of 108.  He is satting 93% on room air.  04/16/2020 EKG Vent. rate 107 BPM PR interval * ms QRS duration 90 ms QT/QTc 358/477 ms P-R-T axes 93 32 82 Sinus tachycardia with 1st degree A-V block with Premature supraventricular complexes and with occasional Premature ventricular complexes Nonspecific ST and T wave abnormality Abnormal ECG  04/15/2020 morning labs. Renal function panel [[id number]] (Abnormal)   Collected: 04/15/20 0432   Updated: 04/15/20 0549   Specimen Type: Blood    Sodium 116Low Panic  mmol/L   Potassium 3.2Low mmol/L   Chloride 83Low mmol/L   CO2 23 mmol/L   Glucose, Bld 123High mg/dL   BUN 5Low mg/dL   Creatinine, Ser 0.52Low mg/dL   Calcium 8.3Low mg/dL   Phosphorus 1.8Low mg/dL   Albumin 3.3Low g/dL   GFR calc non Af Amer >60 mL/min   GFR calc Af Amer >60 mL/min   Anion gap 10  CBC [[id number]] (Abnormal)   Collected: 04/15/20 0432   Updated: 04/15/20 0545   Specimen Type: Blood    WBC 12.4High K/uL   RBC 2.81Low MIL/uL   Hemoglobin 8.6Low g/dL   HCT 24.3Low %   MCV 86.5 fL   MCH 30.6 pg   MCHC 35.4 g/dL   RDW 13.5 %   Platelets 241 K/uL   nRBC 0.0 %   Assessment: 1) sinus tachycardia with sinus arrhythmia. (Exacerbating factors, anemia and electrolyte disturbance) Plan: Metoprolol 5 mg IVP x1. Magnesium sulfate 2 g IVPB x1 Add on MG, PHO and K level. Replace electrolytes as needed.  2) Relative hypoxia. Saturating 93% on room air. Supplemental oxygen and incentive spirometry.  Tennis Must, MD.

## 2020-04-16 NOTE — Progress Notes (Signed)
Patient ID: Jeffrey Gordon, male   DOB: [birth year], 63 y.o.   MRN: [id number] S: developed tachycardia last night but improved this am. O:BP (!) 156/67   Pulse 82   Temp 98.3 F (36.8 C) (Oral)   Resp 18   Ht 6\' 3"  (1.905 m)   Wt 110.3 kg   SpO2 96%   BMI 30.39 kg/m   Intake/Output Summary (Last 24 hours) at 04/16/2020 1112 Last data filed at 04/16/2020 0834 Gross per 24 hour  Intake 682.48 ml  Output 6400 ml  Net -5717.52 ml   Intake/Output: I/O last 3 completed shifts: In: 789.2 [I.V.:766; IV Piggyback:23.2] Out: 9600 [Urine:9600]  Intake/Output this shift:  Total I/O In: 141.1 [I.V.:141.1] Out: -  Weight change:  Gen: NAD CVS: RRR no rub Resp: cta Abd: +BS, soft, NT/ND Ext: no edema GU- resolving hematoma/ecchymosis of groin and scrotum  Recent Labs  Lab 04/13/20 1244 04/13/20 1351 04/13/20 2204 04/13/20 2204 04/14/20 0102 04/14/20 0102 04/14/20 0324 04/14/20 0324 04/14/20 0533 04/14/20 0533 04/14/20 0726 04/14/20 0932 04/15/20 0432 04/15/20 1106 04/15/20 1544 04/15/20 2044 04/16/20 0112 04/16/20 0527 04/16/20 0905  NA 109*   < > 112*   < > 111*   < > 112*   < > 113*   < > 112*   < > 116* 115* 117* 123* 125* 127* 126*  K 3.8   < > 3.4*   < > 3.4*  --  3.6  --  3.5  --  3.6  --  3.2*  --   --   --  3.6 4.0  --   CL 79*   < > 81*  --  83*  --  81*  --  83*  --  82*  --  83*  --   --   --   --  93*  --   CO2 21*   < > 20*  --  19*  --  20*  --  20*  --  21*  --  23  --   --   --   --  24  --   GLUCOSE 114*   < > 104*  --  92  --  99  --  99  --  104*  --  123*  --   --   --   --  110*  --   BUN 10   < > 8  --  8  --  8  --  8  --  7*  --  5*  --   --   --   --  8  --   CREATININE 0.70   < > 0.70  --  0.63  --  0.65  --  0.58*  --  0.65  --  0.52*  --   --   --   --  0.66  --   ALBUMIN 3.5  --   --   --   --   --   --   --   --   --   --   --  3.3*  --   --   --   --  3.1*  --   CALCIUM 8.0*   < > 7.9*  --  7.8*  --  7.7*  --  7.9*  --  7.9*  --  8.3*  --    --   --   --  8.2*  --   PHOS  --   --   --   --   --   --   --   --   --   --   --   --  1.8*  --   --   --  2.3* 2.9  --   AST 34  --   --   --   --   --   --   --   --   --   --   --   --   --   --   --   --   --   --   ALT 21  --   --   --   --   --   --   --   --   --   --   --   --   --   --   --   --   --   --    < > = values in this interval not displayed.   Liver Function Tests: Recent Labs  Lab 04/13/20 1244 04/15/20 0432 04/16/20 0527  AST 34  --   --   ALT 21  --   --   ALKPHOS 25*  --   --   BILITOT 1.3*  --   --   PROT 5.7*  --   --   ALBUMIN 3.5 3.3* 3.1*   No results for input(s): LIPASE, AMYLASE in the last 168 hours. No results for input(s): AMMONIA in the last 168 hours. CBC: Recent Labs  Lab 04/13/20 1244 04/13/20 1351 04/14/20 0533 04/14/20 0726 04/14/20 0938 04/14/20 1344 04/15/20 0432  WBC 13.4*  --  11.3*  --   --   --  12.4*  NEUTROABS 10.4*  --   --   --   --   --   --   HGB 8.5*   < > 8.4*   < > 8.0* 7.9* 8.6*  HCT 23.8*   < > 23.5*   < > 22.7* 22.1* 24.3*  MCV 84.7  --  84.2  --   --   --  86.5  PLT 194  --  216  --   --   --  241   < > = values in this interval not displayed.   Cardiac Enzymes: No results for input(s): CKTOTAL, CKMB, CKMBINDEX, TROPONINI in the last 168 hours. CBG: No results for input(s): GLUCAP in the last 168 hours.  Iron Studies:  Recent Labs    04/13/20 1413  IRON 27*  TIBC 262  FERRITIN 181   Studies/Results: No results found. . Chlorhexidine Gluconate Cloth  6 each Topical Daily  . latanoprost  1 drop Both Eyes QHS  . levothyroxine  88 mcg Oral Q0600  . mometasone-formoterol  2 puff Inhalation BID  . phosphorus  500 mg Oral TID  . polyethylene glycol  17 g Oral BID  . senna-docusate  2 tablet Oral BID    BMET    Component Value Date/Time   NA 126 (L) 04/16/2020 0905   K 4.0 04/16/2020 0527   CL 93 (L) 04/16/2020 0527   CO2 24 04/16/2020 0527   GLUCOSE 110 (H) 04/16/2020 0527   BUN 8 04/16/2020  0527   CREATININE 0.66 04/16/2020 0527   CALCIUM 8.2 (L) 04/16/2020 0527   GFRNONAA >60 04/16/2020 0527   GFRAA >60 04/16/2020 0527   CBC    Component Value Date/Time   WBC 12.4 (H) 04/15/2020 0432   RBC 2.81 (L) 04/15/2020 0432   HGB 8.6 (L) 04/15/2020 0432   HCT 24.3 (L) 04/15/2020 0432   PLT 241 04/15/2020 0432   MCV 86.5 04/15/2020 0432   MCH  30.6 04/15/2020 0432   MCHC 35.4 04/15/2020 0432   RDW 13.5 04/15/2020 0432   LYMPHSABS 0.9 04/13/2020 1244   MONOABS 1.7 (H) 04/13/2020 1244   EOSABS 0.0 04/13/2020 1244   BASOSABS 0.0 04/13/2020 1244     Assessment/Plan: 1. Severe symptomatic Hyponatremia- most consistent with hypovolemic hyponatremia (volume depletion as well as ABLA in setting of SSRI and ARB therapy). 1. Na improved to 127, 3% saline stopped last night 2. Continue to follow sodium level off of hypertonic saline.  3. Continue to hold losartan for now 4. If sodium remains stable ok to resume prozac given his history of severe depression 5. Need to follow sodium levelsevery 4 hours for now 6. TSHand cortisolnormal. Lowurine Na, elevatedurine Osmcompared toserum osmsupports volume depletion as cause of hyponatremia. 2. ABLA- hgb dropped from 14.2 to 7.5 with large ecchymoses of scrotum and groin. Transfuse prn.  Hgb up to 8.6.  Surgery following 3. Constipation and abdominal pain-CT scan without evidence ofileus or obstruction. Will need laxatives and stool softeners per primary. 4. Inguinal hernia repair- with hematoma/ecchymoses per surgery 5. HTN- hold losartan for now 6. Hypothyroidism- TSH WNL 7. GERD- on PPI  Donetta Potts, MD [employer] [phone]

## 2020-04-16 NOTE — Progress Notes (Signed)
Patient Demographics:    Jeffrey Gordon, is a 64 y.o. male, DOB - 1956/01/24, YTK:[id number]  Admit date - 04/13/2020   Admitting Physician Pratik Darleen Crocker, DO  Outpatient Primary MD for the patient is Celene Squibb, MD  LOS - 3   Chief Complaint  Patient presents with  . Weakness        Subjective:    Jeffrey Gordon today has no fevers,   No chest pain,    -No further hiccups,  had episode of tachycardia and shortness of breath with hypoxia overnight--- currently  NSR --Oral intake is fair  Assessment  & Plan :    Principal Problem:   Hyponatremia Active Problems:   Postoperative anemia due to acute blood loss   HTN (hypertension)   Depression  Brief Summary:-  64 y.o. male with medical history significant for COPD, hypothyroidism, GERD, hypertension, and anxiety/depression who has undergone recent right inguinal hernia repair on 04/10/2020 admitted on 2020-04-13 with acute severe symptomatic hyponatremia and acute blood loss anemia -Admitted with a sodium of 109 and hgb of 8.5 -Hyponatremia persisted despite Normal saline infusions patient has been transferred to stepdown unit for 3% saline infusion -Came off 3% saline on 04/16/2020 AM  A/p 1)Severe symptomatic hypovolemic hyponatremia--- suspect volume depletion/dehydration related--- -Sodium finally trending up with 3% saline solution Na-- 112 >> 113 >> 116>> 123>>125>> 127>> 126 -TSH and cortisol WNL -Prozac and losartan discontinued -Came off 3% saline on 04/16/2020 AM -Patient more coherent and more awake  2)Acute symptomatic blood loss anemia--post right groin/hernia surgery on 04/10/20--- this is  postoperative acute blood loss -Work-up consistent with iron deficiency with serum iron of 27 and iron saturation of 10, ferritin is not low, B12 and folate are not low -Hgb was 14 on 04/03/20 -Hemoglobin stable at 8.6 --patient with  significant ecchymoses/bruising/hematoma due to right groin and suprapubic area -Monitor H&H closely especially with IV hydration, transfuse as clinically indicated or if hemoglobin less than 7  3)H/o HTN--losartan and Cardura currently on hold  4)H/o COPD--no acute exacerbation at this time  5) hypothyroidism--stable, TSH is 1.2,  continue levothyroxine  6) depression anxiety--- Prozac on hold due to hyponatremia  7) status post right inguinal hernia repair-- 04/10/20--postop management per general surgeon Dr. Constance Haw  8)Constipation--- Senokot and MiraLAX as ordered  9) urinary retention----Foley in situ, start Flomax,--- voiding trial on 04/17/2020  Disposition/Need for in-Hospital Stay- patient unable to be discharged at this time due to --severe symptomatic hyponatremia requiring IV 3% saline drip-frequent BMP checks ---- If electrolyte/sodium abnormalities stabilizes over the next 24 hours may discharge home  Status is: Inpatient  Remains inpatient appropriate because:severe symptomatic hyponatremia requiring IV 3% saline drip-frequent BMP checks --- If electrolyte/sodium abnormalities stabilizes over the next 24 hours may discharge home  Disposition: The patient is from: Home              Anticipated d/c is to: Home              Anticipated d/c date is: > 3 days              Patient currently is not medically stable to d/c. Barriers: Not Clinically Stable- severe symptomatic hyponatremia requiring IV 3% saline drip-frequent BMP checks ----  If electrolyte/sodium abnormalities stabilizes over the next 24 hours may discharge home  Code Status : full  Family Communication:   (patient is alert, awake and coherent)  Discussed with wife at bedside  Consults  :  Nephrology/Gen surg  DVT Prophylaxis  :   - SCDs  Lab Results  Component Value Date   PLT 241 04/15/2020   Inpatient Medications  Scheduled Meds: . Chlorhexidine Gluconate Cloth  6 each Topical Daily  . latanoprost   1 drop Both Eyes QHS  . levothyroxine  88 mcg Oral Q0600  . mometasone-formoterol  2 puff Inhalation BID  . phosphorus  500 mg Oral TID  . polyethylene glycol  17 g Oral BID  . senna-docusate  2 tablet Oral BID  . tamsulosin  0.4 mg Oral BID WC   Continuous Infusions:  PRN Meds:.acetaminophen **OR** acetaminophen, ipratropium-albuterol, melatonin, ondansetron **OR** ondansetron (ZOFRAN) IV, oxyCODONE, phenol    Anti-infectives (From admission, onward)   None        Objective:   Vitals:   04/16/20 0900 04/16/20 1129 04/16/20 1200 04/16/20 1300  BP: (!) 156/67     Pulse: 82 80 90 87  Resp: 18 15 19 18   Temp:  98.3 F (36.8 C)    TempSrc:  Oral    SpO2: 96% 98% 99% 94%  Weight:      Height:        Wt Readings from Last 3 Encounters:  04/16/20 110.3 kg  04/08/20 105.7 kg  03/17/20 108.4 kg     Intake/Output Summary (Last 24 hours) at 04/16/2020 1323 Last data filed at 04/16/2020 0834 Gross per 24 hour  Intake 682.48 ml  Output 6400 ml  Net -5717.52 ml   Physical Exam  Gen:-Awake, in no acute distress HEENT:- [place].AT, No sclera icterus Neck-Supple Neck,No JVD,.  Lungs-improving air movement, no wheezing CV- S1, S2 normal, regular, no Tachy Abd-  +ve B.Sounds, Abd Soft, No tenderness,    Extremity/Skin:-  , pedal pulses present  Psych-affect is flat, oriented x3 Neuro-generalized weakness ,no new focal deficits, no tremors GU-Foley in situ, --improving scrotal, supra pubic and right groin area swelling and ecchymosis   Data Review:   Micro Results Recent Results (from the past 240 hour(s))  SARS CORONAVIRUS 2 (TAT 6-24 HRS) Nasopharyngeal Nasopharyngeal Swab     Status: None   Collection Time: 04/08/20  3:17 PM   Specimen: Nasopharyngeal Swab  Result Value Ref Range Status   SARS Coronavirus 2 NEGATIVE NEGATIVE Final    Comment: (NOTE) SARS-CoV-2 target nucleic acids are NOT DETECTED.  The SARS-CoV-2 RNA is generally detectable in upper and  lower respiratory specimens during the acute phase of infection. Negative results do not preclude SARS-CoV-2 infection, do not rule out co-infections with other pathogens, and should not be used as the sole basis for treatment or other patient management decisions. Negative results must be combined with clinical observations, patient history, and epidemiological information. The expected result is Negative.  Fact Sheet for Patients: SugarRoll.be  Fact Sheet for Healthcare Providers: https://www.woods-mathews.com/  This test is not yet approved or cleared by the [place] FDA and  has been authorized for detection and/or diagnosis of SARS-CoV-2 by FDA under an Emergency Use Authorization (EUA). This EUA will remain  in effect (meaning this test can be used) for the duration of the COVID-19 declaration under Se ction 564(b)(1) of the Act, 21 U.S.C. section 360bbb-3(b)(1), unless the authorization is terminated or revoked sooner.  Performed at [facility], [address] [place], [place] [address]   SARS Coronavirus 2 by RT PCR (hospital order, performed in [facility] hospital lab) Nasopharyngeal Nasopharyngeal Swab     Status: None   Collection Time: 04/13/20  9:52 AM   Specimen: Nasopharyngeal Swab  Result Value Ref Range Status   SARS Coronavirus 2 NEGATIVE NEGATIVE Final    Comment: (NOTE) SARS-CoV-2 target nucleic acids are NOT DETECTED.  The SARS-CoV-2 RNA is generally detectable in upper and lower respiratory specimens during the acute phase of infection. The lowest concentration of SARS-CoV-2 viral copies this assay can detect is 250 copies / mL. A negative result does not preclude SARS-CoV-2 infection and should not be used as the sole basis for treatment or other patient management decisions.  A negative result may occur with improper specimen collection / handling, submission of specimen other than nasopharyngeal  swab, presence of viral mutation(s) within the areas targeted by this assay, and inadequate number of viral copies (<250 copies / mL). A negative result must be combined with clinical observations, patient history, and epidemiological information.  Fact Sheet for Patients:   StrictlyIdeas.no  Fact Sheet for Healthcare Providers: BankingDealers.co.za  This test is not yet approved or  cleared by the [place] FDA and has been authorized for detection and/or diagnosis of SARS-CoV-2 by FDA under an Emergency Use Authorization (EUA).  This EUA will remain in effect (meaning this test can be used) for the duration of the COVID-19 declaration under Section 564(b)(1) of the Act, 21 U.S.C. section 360bbb-3(b)(1), unless the authorization is terminated or revoked sooner.  Performed at [facility], [address], [place], [place] [address]   Blood culture (routine x 2)     Status: None (Preliminary result)   Collection Time: 04/13/20 12:45 PM   Specimen: BLOOD  Result Value Ref Range Status   Specimen Description BLOOD RIGHT ANTECUBITAL  Final   Special Requests   Final    BOTTLES DRAWN AEROBIC AND ANAEROBIC Blood Culture adequate volume   Culture   Final    NO GROWTH 3 DAYS Performed at [facility], [address], [place], [place] [address]    Report Status PENDING  Incomplete  Blood culture (routine x 2)     Status: None (Preliminary result)   Collection Time: 04/13/20 12:46 PM   Specimen: BLOOD RIGHT HAND  Result Value Ref Range Status   Specimen Description BLOOD RIGHT HAND  Final   Special Requests   Final    BOTTLES DRAWN AEROBIC AND ANAEROBIC Blood Culture adequate volume   Culture   Final    NO GROWTH 3 DAYS Performed at [facility], [address] [place], [place] [address]    Report Status PENDING  Incomplete  MRSA PCR Screening     Status: None   Collection Time: 04/14/20  2:51 PM   Specimen: Nasal Mucosa;  Nasopharyngeal  Result Value Ref Range Status   MRSA by PCR NEGATIVE NEGATIVE Final    Comment:        The GeneXpert MRSA Assay (FDA approved for NASAL specimens only), is one component of a comprehensive MRSA colonization surveillance program. It is not intended to diagnose MRSA infection nor to guide or monitor treatment for MRSA infections. Performed at [facility], [address] [place], [place] [address]     Radiology Reports CT ABDOMEN PELVIS W CONTRAST  Result Date: 04/13/2020 CLINICAL DATA:  Extraperitoneal RIGHT inguinal hernia repair 3 days ago utilizing (2) Perfix plug and mesh repair  RIGHT inguinal canal, now with RIGHT scrotal swelling and hematoma EXAM: CT ABDOMEN AND PELVIS WITH CONTRAST TECHNIQUE: Multidetector CT imaging of the abdomen and pelvis was performed using the standard protocol following bolus administration of intravenous contrast. Sagittal and coronal MPR images reconstructed from axial data set. CONTRAST:  137mL OMNIPAQUE IOHEXOL 300 MG/ML SOLN IV. No oral contrast. COMPARISON:  None FINDINGS: Lower chest: Bibasilar atelectasis and minimal pleural effusions Hepatobiliary: Gallbladder and liver normal appearance Pancreas: Normal appearance Spleen: Normal appearance Adrenals/Urinary Tract: Adrenal glands, kidneys, ureters, and bladder normal appearance. Foley catheter within urinary bladder. Stomach/Bowel: Normal appendix. Mildly prominent stool in cecum. Multiple radiodensities within stool in RIGHT colon, uncertain etiology. Stomach and bowel loops otherwise normal appearance. Vascular/Lymphatic: Atherosclerotic calcifications aorta. Aorta normal caliber. No adenopathy. Reproductive: Unremarkable prostate gland and seminal vesicles. Other: Small LEFT inguinal hernia containing fat. Small umbilical hernia containing fat. No free intraperitoneal air or fluid. Scattered subcutaneous edema at the perineum and thighs as well as the lateral aspects of the pelvis.  Irregular high attenuation material within RIGHT scrotal sac extending into distal RIGHT inguinal canal, 8.2 x 8.1 by greater than 6.4 cm, consistent with hematoma. Two adjacent fluid collections containing tiny foci of gas are identified in the RIGHT inguinal canal, may represent the 2 plugs utilize for hernia repair. Surrounding infiltrative changes. Hemorrhage extends from below the plugs into the inguinal canals. Tiny foci of gas are seen within the RIGHT scrotum and within the RIGHT scrotal wall. No recurrent hernia. Musculoskeletal: Osseous structures unremarkable. IMPRESSION: 8.2 x 8.1 x 6.4 cm diameter hematoma in RIGHT scrotal sac extending into distal inguinal canal. Testes are not delineated; if there is clinical concern for testicular torsion, ultrasound would be recommended. Two adjacent fluid collections containing tiny foci of gas are identified in the RIGHT inguinal canal, may represent the 2 plugs utilized for hernia repair though cannot exclude other postoperative collections; if patient develops fever or leukocytosis and there is clinical concern for infection, would recommend repeat CT to exclude abscess and/or fasciitis. No recurrent hernia. Small LEFT inguinal and umbilical hernias containing fat. Bibasilar atelectasis and minimal pleural effusions. Aortic Atherosclerosis (ICD10-I70.0). Electronically Signed   By: Lavonia Dana M.D.   On: 04/13/2020 17:05   DG Chest Portable 1 View  Result Date: 04/13/2020 CLINICAL DATA:  Cough, nausea, weakness, dizziness EXAM: PORTABLE CHEST 1 VIEW COMPARISON:  11/20/2019 chest radiograph. FINDINGS: Stable cardiomediastinal silhouette with normal heart size. No pneumothorax. New small left pleural effusion. No right pleural effusion. Mild indistinct patchy opacities in upper right lung. Mild left basilar scarring versus atelectasis. IMPRESSION: 1. Mild indistinct patchy opacities in the upper right lung, cannot exclude pneumonia. Suggest short-term PA and  lateral chest radiograph follow-up. 2. New small left pleural effusion. Mild left basilar scarring versus atelectasis. Electronically Signed   By: Ilona Sorrel M.D.   On: 04/13/2020 10:18     CBC Recent Labs  Lab 04/13/20 1244 04/13/20 1351 04/14/20 0533 04/14/20 0726 04/14/20 0938 04/14/20 1344 04/15/20 0432  WBC 13.4*  --  11.3*  --   --   --  12.4*  HGB 8.5*   < > 8.4* 8.3* 8.0* 7.9* 8.6*  HCT 23.8*   < > 23.5* 23.5* 22.7* 22.1* 24.3*  PLT 194  --  216  --   --   --  241  MCV 84.7  --  84.2  --   --   --  86.5  MCH 30.2  --  30.1  --   --   --  30.6  MCHC 35.7  --  35.7  --   --   --  35.4  RDW 13.5  --  13.4  --   --   --  13.5  LYMPHSABS 0.9  --   --   --   --   --   --   MONOABS 1.7*  --   --   --   --   --   --   EOSABS 0.0  --   --   --   --   --   --   BASOSABS 0.0  --   --   --   --   --   --    < > = values in this interval not displayed.    Chemistries  Recent Labs  Lab 04/13/20 1244 04/13/20 1351 04/14/20 0324 04/14/20 0324 04/14/20 0533 04/14/20 0533 04/14/20 0726 04/14/20 7169 04/15/20 0432 04/15/20 1106 04/15/20 1544 04/15/20 2044 04/16/20 0112 04/16/20 0527 04/16/20 0905  NA 109*   < > 112*   < > 113*   < > 112*   < > 116*   < > 117* 123* 125* 127* 126*  K 3.8   < > 3.6   < > 3.5  --  3.6  --  3.2*  --   --   --  3.6 4.0  --   CL 79*   < > 81*  --  83*  --  82*  --  83*  --   --   --   --  93*  --   CO2 21*   < > 20*  --  20*  --  21*  --  23  --   --   --   --  24  --   GLUCOSE 114*   < > 99  --  99  --  104*  --  123*  --   --   --   --  110*  --   BUN 10   < > 8  --  8  --  7*  --  5*  --   --   --   --  8  --   CREATININE 0.70   < > 0.65  --  0.58*  --  0.65  --  0.52*  --   --   --   --  0.66  --   CALCIUM 8.0*   < > 7.7*  --  7.9*  --  7.9*  --  8.3*  --   --   --   --  8.2*  --   MG  --   --   --   --  1.9  --   --   --   --   --   --   --  2.2  --   --   AST 34  --   --   --   --   --   --   --   --   --   --   --   --   --   --   ALT  21  --   --   --   --   --   --   --   --   --   --   --   --   --   --   ALKPHOS 25*  --   --   --   --   --   --   --   --   --   --   --   --   --   --  BILITOT 1.3*  --   --   --   --   --   --   --   --   --   --   --   --   --   --    < > = values in this interval not displayed.   ------------------------------------------------------------------------------------------------------------------ No results for input(s): CHOL, HDL, LDLCALC, TRIG, CHOLHDL, LDLDIRECT in the last 72 hours.  No results found for: HGBA1C ------------------------------------------------------------------------------------------------------------------ Recent Labs    04/13/20 1413  TSH 1.247   ------------------------------------------------------------------------------------------------------------------ Recent Labs    04/13/20 1413  VITAMINB12 279  FOLATE 12.7  FERRITIN 181  TIBC 262  IRON 27*  RETICCTPCT 2.3    Coagulation profile No results for input(s): INR, PROTIME in the last 168 hours.  No results for input(s): DDIMER in the last 72 hours.  Cardiac Enzymes No results for input(s): CKMB, TROPONINI, MYOGLOBIN in the last 168 hours.  Invalid input(s): CK ------------------------------------------------------------------------------------------------------------------ No results found for: BNP   Roxan Hockey M.D on 04/16/2020 at 1:23 PM  Go to [email or web address] - for contact info  [facility] - Office  [phone]

## 2020-04-16 NOTE — Progress Notes (Signed)
[facility] night shift.  The staff reported that the patient is complaining of hiccups.  Metoclopramide 10 mg IVPB x1 given.  The patient was started on pantoprazole 40 mg IVPB x1 dose, then 40 mg p.o. daily to start tomorrow morning.  Tennis Must, MD

## 2020-04-16 NOTE — Progress Notes (Signed)
Patient's HR increased 120's-140's, sustained for 3 minutes. Patient asymptomatic, EKG performed. MD aware. Will continue to monitor.

## 2020-04-16 NOTE — Plan of Care (Signed)
  Problem: Acute Rehab PT Goals(only PT should resolve) Goal: Pt Will Go Supine/Side To Sit Outcome: Progressing Flowsheets (Taken 04/16/2020 1214) Pt will go Supine/Side to Sit:  Independently  with modified independence Goal: Patient Will Transfer Sit To/From Stand Outcome: Progressing Flowsheets (Taken 04/16/2020 1214) Patient will transfer sit to/from stand: with supervision Goal: Pt Will Transfer Bed To Chair/Chair To Bed Outcome: Progressing Flowsheets (Taken 04/16/2020 1214) Pt will Transfer Bed to Chair/Chair to Bed: with supervision Goal: Pt Will Ambulate Outcome: Progressing Flowsheets (Taken 04/16/2020 1214) Pt will Ambulate:  100 feet  with supervision  with rolling walker   12:15 PM, 04/16/20 Lonell Grandchild, MPT Physical Therapist with [facility] [phone] office [phone] mobile phone

## 2020-04-16 NOTE — Evaluation (Signed)
Physical Therapy Evaluation Patient Details Name: Jeffrey Gordon MRN: [id number] DOB: 09/24/1955 Today's Date: 04/16/2020   History of Present Illness  Jeffrey Gordon is a 64 y.o. male s/p Right inguinal hernia repair with mesh, excision of cord lipoma 04/11/20 with medical history significant for COPD, hypothyroidism, GERD, hypertension, and anxiety/depression who has undergone recent right inguinal hernia repair on 04/10/2020.  He reports that since his surgery he has had increasing fatigue with weakness as well as nausea and vomiting.  He continues to have mild lower abdominal pain that has remained unchanged since the surgery.  He also notes some swelling to his groin and scrotal region that has also worsened since the surgery.  He is currently unable to eat and has not had a bowel movement since Friday morning.  He denies any fevers or chills and has not had any chest pain or shortness of breath.  He denies any falls or recent injury.    Clinical Impression  Patient functioning near baseline for functional mobility and gait since date of surgery, since then has been household ambulator using SPC, but required use of RW today due to generalized weakness and poor standing balance, ambulated in room and hallway without loss of balance, limited mostly due to c/o fatigue.  Patient tolerated sitting up in chair after therapy - RN aware.  Patient will benefit from continued physical therapy in hospital and recommended venue below to increase strength, balance, endurance for safe ADLs and gait.      Follow Up Recommendations Home health PT;Supervision for mobility/OOB;Supervision - Intermittent    Equipment Recommendations  None recommended by PT    Recommendations for Other Services       Precautions / Restrictions Precautions Precautions: Fall Restrictions Weight Bearing Restrictions: No      Mobility  Bed Mobility Overal bed mobility: Modified Independent             General  bed mobility comments: increased time, labored movement  Transfers Overall transfer level: Needs assistance Equipment used: Rolling walker (2 wheeled) Transfers: Sit to/from [facility] Sit to Stand: Min guard Stand pivot transfers: Min guard       General transfer comment: increased time, labored movement  Ambulation/Gait Ambulation/Gait assistance: Min guard Gait Distance (Feet): 50 Feet Assistive device: Rolling walker (2 wheeled) Gait Pattern/deviations: Decreased step length - right;Decreased step length - left;Decreased stride length Gait velocity: decreased   General Gait Details: unsteady on feet requiring use of RW for safety demonstrating slow labored cadence without loss of balance, limited secondary to fatigue  Stairs            Wheelchair Mobility    Modified Rankin (Stroke Patients Only)       Balance Overall balance assessment: Needs assistance Sitting-balance support: Feet supported;No upper extremity supported Sitting balance-Leahy Scale: Good Sitting balance - Comments: seated at EOB   Standing balance support: During functional activity;No upper extremity supported Standing balance-Leahy Scale: Poor Standing balance comment: fair using RW                             Pertinent Vitals/Pain Pain Assessment: 0-10 Pain Score: 2  Pain Location: groin area at surgical site Pain Descriptors / Indicators: Sore Pain Intervention(s): Limited activity within patient's tolerance;Monitored during session    Home Living Family/patient expects to be discharged to:: Private residence Living Arrangements: Spouse/significant other Available Help at Discharge: Family;Available 24 hours/day Type of Home: [place]  Access: Ramped entrance     Home Layout: Two level;Able to live on main level with bedroom/bathroom;Laundry or work area in [place]: Environmental consultant - 2 wheels;Cane - single point;Bedside commode      Prior  Function Level of Independence: Independent with assistive device(s)         Comments: household ambulator using SPC     Hand Dominance        Extremity/Trunk Assessment   Upper Extremity Assessment Upper Extremity Assessment: Generalized weakness    Lower Extremity Assessment Lower Extremity Assessment: Generalized weakness    Cervical / Trunk Assessment Cervical / Trunk Assessment: Normal  Communication   Communication: No difficulties  Cognition Arousal/Alertness: Awake/alert Behavior During Therapy: WFL for tasks assessed/performed Overall Cognitive Status: Within Functional Limits for tasks assessed                                 General Comments: initially very lethargic, once awake patient responded to questions appropriately      General Comments      Exercises     Assessment/Plan    PT Assessment Patient needs continued PT services  PT Problem List Decreased strength;Decreased activity tolerance;Decreased balance;Decreased mobility       PT Treatment Interventions Gait training;Stair training;DME instruction;Functional mobility training;Therapeutic activities;Therapeutic exercise;Patient/family education;Balance training    PT Goals (Current goals can be found in the Care Plan section)  Acute Rehab PT Goals Patient Stated Goal: return home with family to assist PT Goal Formulation: With patient Time For Goal Achievement: 04/23/20 Potential to Achieve Goals: Good    Frequency Min 3X/week   Barriers to discharge        Co-evaluation               AM-PAC PT "6 Clicks" Mobility  Outcome Measure Help needed turning from your back to your side while in a flat bed without using bedrails?: None Help needed moving from lying on your back to sitting on the side of a flat bed without using bedrails?: A Little Help needed moving to and from a bed to a chair (including a wheelchair)?: A Little Help needed standing up from a chair  using your arms (e.g., wheelchair or bedside chair)?: A Little Help needed to walk in hospital room?: A Little Help needed climbing 3-5 steps with a railing? : A Lot 6 Click Score: 18    End of Session   Activity Tolerance: Patient tolerated treatment well;Patient limited by fatigue Patient left: in chair;with call bell/phone within reach;with family/visitor present Nurse Communication: Mobility status PT Visit Diagnosis: Unsteadiness on feet (R26.81);Other abnormalities of gait and mobility (R26.89);Muscle weakness (generalized) (M62.81)    Time: 2130-8657 PT Time Calculation (min) (ACUTE ONLY): 26 min   Charges:   PT Evaluation $PT Eval Moderate Complexity: 1 Mod PT Treatments $Therapeutic Activity: 23-37 mins        12:13 PM, 04/16/20 Lonell Grandchild, MPT Physical Therapist with [facility] 336 [phone] office [phone] mobile phone

## 2020-04-16 NOTE — Progress Notes (Signed)
[facility]  Doing well and eating. Swelling improved. No signs of redness or drainage. No more hiccups.  Continue elevating scrotal area. Continue ice as tolerated. Diet as tolerated.  Curlene Labrum, MD [facility] [address] [place], [place] [address] [phone] (office)

## 2020-04-16 NOTE — TOC Progression Note (Signed)
Transition of Care [facility]) - Progression Note    Patient Details  Name: Jeffrey Gordon MRN: [id number] Date of Birth: August 12, 1956  Transition of Care [facility]) CM/SW [place], [place] Phone Number: 04/16/2020, 12:06 PM  Clinical Narrative:    Pt admitted for Hyponatremia. PT recommended HH PT. Pt agreeable with HH PT. TOC referred pt to Tim with [facility]. Tim accepted pt.      Barriers to Discharge: Continued Medical Work up  Expected Discharge Plan and Services                                                 Social Determinants of Health (SDOH) Interventions    Readmission Risk Interventions No flowsheet data found.

## 2020-04-17 LAB — RENAL FUNCTION PANEL
Albumin: 3 g/dL — ABNORMAL LOW (ref 3.5–5.0)
Anion gap: 9 (ref 5–15)
BUN: 8 mg/dL (ref 8–23)
CO2: 28 mmol/L (ref 22–32)
Calcium: 8.3 mg/dL — ABNORMAL LOW (ref 8.9–10.3)
Chloride: 91 mmol/L — ABNORMAL LOW (ref 98–111)
Creatinine, Ser: 0.63 mg/dL (ref 0.61–1.24)
GFR calc Af Amer: >60 mL/min (ref >60)
GFR calc non Af Amer: >60 mL/min (ref >60)
Glucose, Bld: 116 mg/dL — ABNORMAL HIGH (ref 70–99)
Phosphorus: 3.1 mg/dL (ref 2.5–4.6)
Potassium: 3.6 mmol/L (ref 3.5–5.1)
Sodium: 128 mmol/L — ABNORMAL LOW (ref 135–145)

## 2020-04-17 MED ORDER — TAMSULOSIN HCL 0.4 MG PO CAPS
0.4000 mg | ORAL_CAPSULE | Freq: Two times a day (BID) | ORAL | 2 refills | Status: DC
Start: 2020-04-17 — End: 2020-05-27

## 2020-04-17 MED ORDER — SENNOSIDES-DOCUSATE SODIUM 8.6-50 MG PO TABS
2.0000 | ORAL_TABLET | Freq: Every day | ORAL | 2 refills | Status: DC
Start: 2020-04-17 — End: 2020-07-14

## 2020-04-17 MED ORDER — ASPIRIN EC 81 MG PO TBEC
81.0000 mg | DELAYED_RELEASE_TABLET | Freq: Every day | ORAL | 11 refills | Status: DC
Start: 2020-04-17 — End: 2024-01-22

## 2020-04-17 MED ORDER — POLYETHYLENE GLYCOL 3350 17 G PO PACK: 17.0000 g | PACK | Freq: Every day | ORAL | 2 refills | Status: DC

## 2020-04-17 MED ORDER — BISACODYL 10 MG RE SUPP
10.0000 mg | Freq: Once | RECTAL | Status: AC
  Administered 2020-04-17: 10 mg via RECTAL
  Filled 2020-04-17: qty 1

## 2020-04-17 MED ORDER — ACETAMINOPHEN 325 MG PO TABS: 650.0000 mg | ORAL_TABLET | Freq: Four times a day (QID) | ORAL | 2 refills | Status: DC | PRN

## 2020-04-17 MED ORDER — CHLORPROMAZINE HCL 10 MG PO TABS: 10.0000 mg | ORAL_TABLET | Freq: Two times a day (BID) | ORAL | 0 refills | Status: DC | PRN

## 2020-04-17 NOTE — TOC Transition Note (Signed)
Transition of Care [facility]) - CM/SW Discharge Note   Patient Details  Name: Jeffrey Gordon MRN: [id number] Date of Birth: [birth year]  Transition of Care [facility]) CM/SW Contact:  Shade Flood, LCSW Phone Number: 04/17/2020, 12:48 PM   Clinical Narrative:     Pt stable for dc today per MD. Plan remains home with [facility] today. Per PT, pt also needs a RW. Updated Tim at [employer]. Arranged RW with [employer] and it will be delivered to pt's room prior to dc.  No other TOC needs identified for dc.  Expected Discharge Plan: [place] Barriers to Discharge: Barriers Resolved   Patient Goals and CMS Choice Patient states their goals for this hospitalization and ongoing recovery are:: go home CMS Medicare.gov Compare Post Acute Care list provided to:: Patient Choice offered to / list presented to : Patient  Expected Discharge Plan and Services Expected Discharge Plan: [place] In-house Referral: Clinical Social Work   Post Acute Care Choice: Home Health, Durable Medical Equipment Living arrangements for the past 2 months: [place]                   DME Agency: [facility] Date DME Agency Contacted: 04/17/20   Representative spoke with at DME Agency: [place]: PT [place]: [facility]) Date [place]: 04/16/20   Representative spoke with at [place]: Tim  Prior Living Arrangements/Services Living arrangements for the past 2 months: [place] Lives with:: Spouse Patient language and need for interpreter reviewed:: Yes Do you feel safe going back to the place where you live?: Yes      Need for Family Participation in Patient Care: Yes (Comment) Care giver support system in place?: Yes (comment)   Criminal Activity/Legal Involvement Pertinent to Current Situation/Hospitalization: No - Comment as needed  Activities of Daily Living Home Assistive Devices/Equipment:  Eyeglasses ADL Screening (condition at time of admission) Patient's cognitive ability adequate to safely complete daily activities?: Yes Is the patient deaf or have difficulty hearing?: No Does the patient have difficulty seeing, even when wearing glasses/contacts?: No Does the patient have difficulty concentrating, remembering, or making decisions?: No Patient able to express need for assistance with ADLs?: Yes Does the patient have difficulty dressing or bathing?: No Independently performs ADLs?: Yes (appropriate for developmental age) Does the patient have difficulty walking or climbing stairs?: No Weakness of Legs: None Weakness of Arms/Hands: None  Permission Sought/Granted Permission sought to share information with : Chartered certified accountant granted to share information with : Yes, Verbal Permission Granted     Permission granted to share info w AGENCY: HH/DME agencies        Emotional Assessment       Orientation: : Oriented to Self, Oriented to Place, Oriented to  Time, Oriented to Situation Alcohol / Substance Use: Not Applicable Psych Involvement: No (comment)  Admission diagnosis:  Hyponatremia [E87.1] Patient Active Problem List   Diagnosis Date Noted  . Postoperative anemia due to acute blood loss 04/14/2020  . HTN (hypertension) 04/14/2020  . Depression 04/14/2020  . Hyponatremia 04/13/2020  . Right inguinal hernia 03/17/2020  . 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 PREMATURE EJACULATION 10/21/2008  . ERECTILE DYSFUNCTION 11/06/2006  . HYPERLIPIDEMIA 07/04/2006  . DEPRESSION 07/04/2006  . HYPERTENSION 07/04/2006  . ALLERGIC RHINITIS 07/04/2006  . ESOPHAGITIS NOS 07/04/2006  . GERD 07/04/2006  . IBS 07/04/2006  . BENIGN PROSTATIC HYPERTROPHY 07/04/2006  .  DYSPHAGIA, UNSPECIFIED 07/04/2006  . FRACTURE, FINGER 07/04/2006   PCP:  Celene Squibb, MD Pharmacy:   [facility] [place], [place] AT [place] [address] [place] [address] Phone: [phone] Fax: [phone]     Social Determinants of Health (SDOH) Interventions    Readmission Risk Interventions No flowsheet data found.  Final next level of care: [place] Barriers to Discharge: Barriers Resolved   Patient Goals and CMS Choice Patient states their goals for this hospitalization and ongoing recovery are:: go home CMS Medicare.gov Compare Post Acute Care list provided to:: Patient Choice offered to / list presented to : Patient  Discharge Placement                       Discharge Plan and Services In-house Referral: Clinical Social Work   Post Acute Care Choice: Home Health, Durable Medical Equipment            DME Agency: [facility] Date DME Agency Contacted: 04/17/20   Representative spoke with at DME Agency: [place]: PT [place]: [facility] Date [place]: 04/16/20   Representative spoke with at [place]: [place] ([place]) Interventions     Readmission Risk Interventions No flowsheet data found.

## 2020-04-17 NOTE — Discharge Summary (Signed)
Jeffrey Gordon, is a 64 y.o. male  DOB 07/13/1956  MRN [id number].  Admission date:  04/13/2020  Admitting Physician  Rodena Goldmann, DO  Discharge Date:  04/17/2020   Primary MD  Celene Squibb, MD  Recommendations for primary care physician for things to follow:   1)Avoid ibuprofen/Advil/Aleve/Motrin/Goody Powders/Naproxen/BC powders/Meloxicam/Diclofenac/Indomethacin and other Nonsteroidal anti-inflammatory medications as these will make you more likely to bleed and can cause stomach ulcers, can also cause Kidney problems.   2) repeat CBC and BMP blood test with primary care physician within a week  3) follow-up with general surgeon Dr. Constance Haw as previously advised  4)-Please follow-up with Urologist Dr. Alyson Ingles in about  1 weeks  for recheck and follow-up evaluation  in his office----[facility], [address], [place] [address] Phone Number----[phone]  Admission Diagnosis  Hyponatremia [E87.1]   Discharge Diagnosis  Hyponatremia [E87.1]    Principal Problem:   Hyponatremia Active Problems:   Postoperative anemia due to acute blood loss   HTN (hypertension)   Depression      Past Medical History:  Diagnosis Date  . Allergy   . Anxiety   . Blurred vision, left eye   . Cataract   . COPD (chronic obstructive pulmonary disease) ([place])   . Depression   . GERD (gastroesophageal reflux disease)   . High cholesterol   . Hypertension   . Hypothyroidism     Past Surgical History:  Procedure Laterality Date  . CATARACT EXTRACTION W/ INTRAOCULAR LENS  IMPLANT, BILATERAL     2002 (left); 2012 (right)  . COLONOSCOPY    . EYE SURGERY    . [place]   left  . INGUINAL HERNIA REPAIR Right 04/10/2020   Procedure: HERNIA REPAIR INGUINAL ADULT;  Surgeon: Virl Cagey, MD;  Location: [facility];  Service: General;  Laterality: Right;  . PARS PLANA  VITRECTOMY  10/20/11   "took out the old lens implant and put a new one in"  . PARS PLANA VITRECTOMY  10/20/2011   Procedure: PARS PLANA VITRECTOMY WITH 25G REMOVAL/SUTURE INTRAOCULAR LENS;  Surgeon: Hayden Pedro, MD;  Location: [facility];  Service: Ophthalmology;  Laterality: Left;  . RETINAL DETACHMENT SURGERY  2002   left  . RETINAL DETACHMENT SURGERY  2003   "repair; left"       HPI  from the history and physical done on the day of admission:   Patient coming from: Home  Chief Complaint: Abdominal pain/N/V/weakness/fatigue  HPI: VERDIE BARROWS is a 64 y.o. male with medical history significant for COPD, hypothyroidism, GERD, hypertension, and anxiety/depression who has undergone recent right inguinal hernia repair on 04/10/2020.  He reports that since his surgery he has had increasing fatigue with weakness as well as nausea and vomiting.  He continues to have mild lower abdominal pain that has remained unchanged since the surgery.  He also notes some swelling to his groin and scrotal region that has also worsened since the surgery.  He is currently unable to eat and has not had a bowel movement since Friday morning.  He denies any fevers or chills and has not had any chest pain or shortness of breath.  He denies any falls or recent injury.   ED Course: Vital signs are stable and patient is currently afebrile.  He does have leukocytosis of 13,400 and sodium is noted to be very low at 109.  No recent lab work obtained for comparison.  His hemoglobin is also 8.5 which was noted to be 14 on 8/20.  He appears to be quite lethargic on examination.  He is noted to have hiccups and is noted significant swelling and bruising over his thighs and scrotal region.  Review of Systems: All others reviewed and otherwise negative.       Hospital Course:   Brief Summary:- 65 y.o.malewith medical history significant forCOPD, hypothyroidism, GERD, hypertension, and anxiety/depression who has  undergone recent right inguinal hernia repair on 04/10/2020 admitted on 2020-04-13 with acute severe symptomatic hyponatremia and acute blood loss anemia -Admitted with a sodium of 109 and hgb of 8.5 -Hyponatremia persisted despite Normal saline infusions patient has been transferred to stepdown unit for 3% saline infusion -Came off 3% saline on 04/16/2020 AM  A/p 1)Severe symptomatic hypovolemic hyponatremia--- suspect volume depletion/dehydration related--- -Sodium finally trending up with 3% saline solution Na-- 112 >> 113 >> 116>> 123>>125>> 127>> 126>>128 -TSH and cortisol WNL -Prozac and losartan discontinued -Came off 3% saline on 04/16/2020 AM -Patient cognitively appears back to baseline, -Sodium levels have remained stable off IV fluids -Repeat BMP as outpatient in 1 week advised  2)Acute symptomatic blood loss anemia--post right groin/hernia surgery on 04/10/20--- this is  postoperative acute blood loss -Work-up consistent with iron deficiency with serum iron of 27 and iron saturation of 10, ferritin is not low, B12 and folate are not low -Hgb was 14 on 04/03/20 -Hemoglobin stable at 8.8 --patient with significant ecchymoses/bruising/hematoma due to right groin and suprapubic area -Repeat CBC as outpatient in 1 week advised  3)H/o HTN--okay to restart losartan and stop Cardura, use Flomax instead   4)H/o COPD--no acute exacerbation at this time  5) hypothyroidism--stable, TSH is 1.2,  continue levothyroxine  6) depression anxiety--- sodium has been stable okay to restart Prozac   7) status post right inguinal hernia repair-- 04/10/20--postop management per general surgeon Dr. Constance Haw  8)Constipation--- Senokot and MiraLAX as ordered, had small BM with Dulcolax suppository  9) urinary retention----Foley in situ, started Flomax,---  patient failed voiding trial on 04/17/2020 -Outpatient urology follow-up Dr. Alyson Ingles advised  10) generalized weakness and  deconditioning--physical therapy evaluation appreciated okay to discharge home with home health PT-  Disposition--- discharge home with home health PT  Code Status : full  Family Communication:   (patient is alert, awake and coherent)  Discussed with wife at bedside  Consults  :  Nephrology/Gen surg  Discharge Condition: stable  Follow UP   Follow-up Information    [facility].   Specialty: Home Health Services Why: Home PT.  Will contact you within 48 hours of discharge.  Contact information: [address] [place] [address] [phone]        Virl Cagey, MD Follow up on 04/23/2020.   Specialty: General Surgery Why: wound check, swelling check  Contact information: [address] [place] [facility] [address] [phone]        McKenzie, Candee Furbish, MD. Schedule an appointment as soon as possible for  a visit in 1 week(s).   Specialty: Urology Contact information: [address] [place] [address] [phone]                Diet and Activity recommendation:  As advised  Discharge Instructions    Discharge Instructions    Call MD for:  difficulty breathing, headache or visual disturbances   Complete by: As directed    Call MD for:  persistant dizziness or light-headedness   Complete by: As directed    Call MD for:  persistant nausea and vomiting   Complete by: As directed    Call MD for:  redness, tenderness, or signs of infection (pain, swelling, redness, odor or green/yellow discharge around incision site)   Complete by: As directed    Call MD for:  temperature >100.4   Complete by: As directed    Diet - low sodium heart healthy   Complete by: As directed    Discharge instructions   Complete by: As directed    1)Avoid ibuprofen/Advil/Aleve/Motrin/Goody Powders/Naproxen/BC powders/Meloxicam/Diclofenac/Indomethacin and other Nonsteroidal anti-inflammatory medications as these will make you more likely to  bleed and can cause stomach ulcers, can also cause Kidney problems.   2) repeat CBC and BMP blood test with primary care physician within a week  3) follow-up with general surgeon Dr. Constance Haw as previously advised  4)-Please follow-up with Urologist Dr. Alyson Ingles in about  1 weeks  for recheck and follow-up evaluation  in his office----[facility], [address], [address], [place] [address] Phone Number----[phone]   Discharge wound care:   Complete by: As directed    As advised by Dr. Constance Haw   Increase activity slowly   Complete by: As directed         Discharge Medications     Allergies as of 04/17/2020      Reactions   Durezol [difluprednate] Other (See Comments)   "Pressure increased in my eye"   Advair Diskus [fluticasone-salmeterol] Other (See Comments)   Issues sleeping      Medication List    STOP taking these medications   docusate sodium 100 MG capsule Commonly known as: Colace   doxazosin 4 MG tablet Commonly known as: CARDURA   losartan 100 MG tablet Commonly known as: COZAAR     TAKE these medications   acetaminophen 325 MG tablet Commonly known as: TYLENOL Take 2 tablets (650 mg total) by mouth every 6 (six) hours as needed for mild pain (or Fever >/= 101).   aspirin EC 81 MG tablet Take 1 tablet (81 mg total) by mouth daily with breakfast. What changed: when to take this   chlorproMAZINE 10 MG tablet Commonly known as: THORAZINE Take 1 tablet (10 mg total) by mouth every 12 (twelve) hours as needed for hiccoughs.   CO ENZYME Q-10 PO Take 1 capsule by mouth daily.   Combigan 0.2-0.5 % ophthalmic solution Generic drug: brimonidine-timolol Place 1 drop into the right eye in the morning and at bedtime.   FLUoxetine 40 MG capsule Commonly known as: PROZAC Take 40 mg by mouth daily.   latanoprost 0.005 % ophthalmic solution Commonly known as: XALATAN Place 1 drop into both eyes at bedtime.   levothyroxine 88 MCG  tablet Commonly known as: SYNTHROID Take 88 mcg by mouth daily before breakfast.   omeprazole 20 MG capsule Commonly known as: PRILOSEC Take 20 mg by mouth daily.   ondansetron 4 MG tablet Commonly known as: Zofran Take 1 tablet (4  mg total) by mouth every 8 (eight) hours as needed.   oxyCODONE 5 MG immediate release tablet Commonly known as: Roxicodone Take 1 tablet (5 mg total) by mouth every 4 (four) hours as needed for severe pain or breakthrough pain.   polyethylene glycol 17 g packet Commonly known as: MIRALAX / GLYCOLAX Take 17 g by mouth daily.   PRESERVISION AREDS 2 PO Take 1 capsule by mouth in the morning and at bedtime.   ProAir HFA 108 (90 Base) MCG/ACT inhaler Generic drug: albuterol Inhale 1-2 puffs into the lungs every 6 (six) hours as needed.   rosuvastatin 10 MG tablet Commonly known as: CRESTOR Take 10 mg by mouth daily.   senna-docusate 8.6-50 MG tablet Commonly known as: Senokot-S Take 2 tablets by mouth at bedtime.   sildenafil 100 MG tablet Commonly known as: VIAGRA Take 100 mg by mouth daily as needed.   Symbicort 160-4.5 MCG/ACT inhaler Generic drug: budesonide-formoterol INHALE 2 PUFFS INTO THE LUNGS TWICE DAILY What changed: when to take this   tamsulosin 0.4 MG Caps capsule Commonly known as: FLOMAX Take 1 capsule (0.4 mg total) by mouth 2 (two) times daily.            Durable Medical Equipment  (From admission, onward)         Start     Ordered   04/17/20 1214  For home use only DME Walker rolling  Once       Question Answer Comment  Walker: With 5 Inch Wheels   Patient needs a walker to treat with the following condition Dyspnea and respiratory abnormalities      04/17/20 1213           Discharge Care Instructions  (From admission, onward)         Start     Ordered   04/17/20 0000  Discharge wound care:       Comments: As advised by Dr. Constance Haw   04/17/20 1730          Major procedures and Radiology  Reports - PLEASE review detailed and final reports for all details, in brief -    CT ABDOMEN PELVIS W CONTRAST  Result Date: 04/13/2020 CLINICAL DATA:  Extraperitoneal RIGHT inguinal hernia repair 3 days ago utilizing (2) Perfix plug and mesh repair RIGHT inguinal canal, now with RIGHT scrotal swelling and hematoma EXAM: CT ABDOMEN AND PELVIS WITH CONTRAST TECHNIQUE: Multidetector CT imaging of the abdomen and pelvis was performed using the standard protocol following bolus administration of intravenous contrast. Sagittal and coronal MPR images reconstructed from axial data set. CONTRAST:  13mL OMNIPAQUE IOHEXOL 300 MG/ML SOLN IV. No oral contrast. COMPARISON:  None FINDINGS: Lower chest: Bibasilar atelectasis and minimal pleural effusions Hepatobiliary: Gallbladder and liver normal appearance Pancreas: Normal appearance Spleen: Normal appearance Adrenals/Urinary Tract: Adrenal glands, kidneys, ureters, and bladder normal appearance. Foley catheter within urinary bladder. Stomach/Bowel: Normal appendix. Mildly prominent stool in cecum. Multiple radiodensities within stool in RIGHT colon, uncertain etiology. Stomach and bowel loops otherwise normal appearance. Vascular/Lymphatic: Atherosclerotic calcifications aorta. Aorta normal caliber. No adenopathy. Reproductive: Unremarkable prostate gland and seminal vesicles. Other: Small LEFT inguinal hernia containing fat. Small umbilical hernia containing fat. No free intraperitoneal air or fluid. Scattered subcutaneous edema at the perineum and thighs as well as the lateral aspects of the pelvis. Irregular high attenuation material within RIGHT scrotal sac extending into distal RIGHT inguinal canal, 8.2 x 8.1 by greater than 6.4 cm, consistent with hematoma. Two adjacent fluid collections containing  tiny foci of gas are identified in the RIGHT inguinal canal, may represent the 2 plugs utilize for hernia repair. Surrounding infiltrative changes. Hemorrhage extends  from below the plugs into the inguinal canals. Tiny foci of gas are seen within the RIGHT scrotum and within the RIGHT scrotal wall. No recurrent hernia. Musculoskeletal: Osseous structures unremarkable. IMPRESSION: 8.2 x 8.1 x 6.4 cm diameter hematoma in RIGHT scrotal sac extending into distal inguinal canal. Testes are not delineated; if there is clinical concern for testicular torsion, ultrasound would be recommended. Two adjacent fluid collections containing tiny foci of gas are identified in the RIGHT inguinal canal, may represent the 2 plugs utilized for hernia repair though cannot exclude other postoperative collections; if patient develops fever or leukocytosis and there is clinical concern for infection, would recommend repeat CT to exclude abscess and/or fasciitis. No recurrent hernia. Small LEFT inguinal and umbilical hernias containing fat. Bibasilar atelectasis and minimal pleural effusions. Aortic Atherosclerosis (ICD10-I70.0). Electronically Signed   By: Lavonia Dana M.D.   On: 04/13/2020 17:05   DG Chest Portable 1 View  Result Date: 04/13/2020 CLINICAL DATA:  Cough, nausea, weakness, dizziness EXAM: PORTABLE CHEST 1 VIEW COMPARISON:  11/20/2019 chest radiograph. FINDINGS: Stable cardiomediastinal silhouette with normal heart size. No pneumothorax. New small left pleural effusion. No right pleural effusion. Mild indistinct patchy opacities in upper right lung. Mild left basilar scarring versus atelectasis. IMPRESSION: 1. Mild indistinct patchy opacities in the upper right lung, cannot exclude pneumonia. Suggest short-term PA and lateral chest radiograph follow-up. 2. New small left pleural effusion. Mild left basilar scarring versus atelectasis. Electronically Signed   By: Ilona Sorrel M.D.   On: 04/13/2020 10:18    Micro Results   Recent Results (from the past 240 hour(s))  SARS CORONAVIRUS 2 (TAT 6-24 HRS) Nasopharyngeal Nasopharyngeal Swab     Status: None   Collection Time: 04/08/20   3:17 PM   Specimen: Nasopharyngeal Swab  Result Value Ref Range Status   SARS Coronavirus 2 NEGATIVE NEGATIVE Final    Comment: (NOTE) SARS-CoV-2 target nucleic acids are NOT DETECTED.  The SARS-CoV-2 RNA is generally detectable in upper and lower respiratory specimens during the acute phase of infection. Negative results do not preclude SARS-CoV-2 infection, do not rule out co-infections with other pathogens, and should not be used as the sole basis for treatment or other patient management decisions. Negative results must be combined with clinical observations, patient history, and epidemiological information. The expected result is Negative.  Fact Sheet for Patients: SugarRoll.be  Fact Sheet for Healthcare Providers: https://www.woods-mathews.com/  This test is not yet approved or cleared by the [place] FDA and  has been authorized for detection and/or diagnosis of SARS-CoV-2 by FDA under an Emergency Use Authorization (EUA). This EUA will remain  in effect (meaning this test can be used) for the duration of the COVID-19 declaration under Se ction 564(b)(1) of the Act, 21 U.S.C. section 360bbb-3(b)(1), unless the authorization is terminated or revoked sooner.  Performed at [facility], [place] [address], [place], [place] [address]   SARS Coronavirus 2 by RT PCR (hospital order, performed in [facility] hospital lab) Nasopharyngeal Nasopharyngeal Swab     Status: None   Collection Time: 04/13/20  9:52 AM   Specimen: Nasopharyngeal Swab  Result Value Ref Range Status   SARS Coronavirus 2 NEGATIVE NEGATIVE Final    Comment: (NOTE) SARS-CoV-2 target nucleic acids are NOT DETECTED.  The SARS-CoV-2 RNA is generally detectable in upper and lower respiratory  specimens during the acute phase of infection. The lowest concentration of SARS-CoV-2 viral copies this assay can detect is 250 copies / mL. A negative result does not  preclude SARS-CoV-2 infection and should not be used as the sole basis for treatment or other patient management decisions.  A negative result may occur with improper specimen collection / handling, submission of specimen other than nasopharyngeal swab, presence of viral mutation(s) within the areas targeted by this assay, and inadequate number of viral copies (<250 copies / mL). A negative result must be combined with clinical observations, patient history, and epidemiological information.  Fact Sheet for Patients:   StrictlyIdeas.no  Fact Sheet for Healthcare Providers: BankingDealers.co.za  This test is not yet approved or  cleared by the [place] FDA and has been authorized for detection and/or diagnosis of SARS-CoV-2 by FDA under an Emergency Use Authorization (EUA).  This EUA will remain in effect (meaning this test can be used) for the duration of the COVID-19 declaration under Section 564(b)(1) of the Act, 21 U.S.C. section 360bbb-3(b)(1), unless the authorization is terminated or revoked sooner.  Performed at [facility], [address], [place], [place] [address]   Blood culture (routine x 2)     Status: None (Preliminary result)   Collection Time: 04/13/20 12:45 PM   Specimen: BLOOD  Result Value Ref Range Status   Specimen Description BLOOD RIGHT ANTECUBITAL  Final   Special Requests   Final    BOTTLES DRAWN AEROBIC AND ANAEROBIC Blood Culture adequate volume   Culture   Final    NO GROWTH 4 DAYS Performed at [facility], [address], [place], [place] [address]    Report Status PENDING  Incomplete  Blood culture (routine x 2)     Status: None (Preliminary result)   Collection Time: 04/13/20 12:46 PM   Specimen: BLOOD RIGHT HAND  Result Value Ref Range Status   Specimen Description BLOOD RIGHT HAND  Final   Special Requests   Final    BOTTLES DRAWN AEROBIC AND ANAEROBIC Blood Culture adequate volume   Culture    Final    NO GROWTH 4 DAYS Performed at [facility], [address], [place], [place] [address]    Report Status PENDING  Incomplete  MRSA PCR Screening     Status: None   Collection Time: 04/14/20  2:51 PM   Specimen: Nasal Mucosa; Nasopharyngeal  Result Value Ref Range Status   MRSA by PCR NEGATIVE NEGATIVE Final    Comment:        The GeneXpert MRSA Assay (FDA approved for NASAL specimens only), is one component of a comprehensive MRSA colonization surveillance program. It is not intended to diagnose MRSA infection nor to guide or monitor treatment for MRSA infections. Performed at [facility], [address] [place], [place] [address]        Today   Subjective    Arvel Oquinn today has no new concerns          Patient has been seen and examined prior to discharge   Objective   Blood pressure 138/72, pulse 86, temperature 98.3 F (36.8 C), temperature source Oral, resp. rate 18, height 6\' 3"  (1.905 m), weight 110.3 kg, SpO2 99 %.   Intake/Output Summary (Last 24 hours) at 04/17/2020 1814 Last data filed at 04/17/2020 0555 Gross per 24 hour  Intake --  Output 2800 ml  Net -2800 ml    Exam Gen:-Awake, in no acute distress HEENT:- .AT, No sclera icterus Neck-Supple  Neck,No JVD,.  Lungs-improving air movement, no wheezing CV- S1, S2 normal, regular, no Tachy Abd-  +ve B.Sounds, Abd Soft, No tenderness,    Extremity/Skin:-  , pedal pulses present  Psych-affect is flat, oriented x3 Neuro-generalized weakness ,no new focal deficits, no tremors GU-Foley in situ, --improving scrotal, supra pubic and right groin area swelling and ecchymosis   Data Review   CBC w Diff:  Lab Results  Component Value Date   WBC 11.7 (H) 04/16/2020   HGB 8.8 (L) 04/16/2020   HCT 26.2 (L) 04/16/2020   PLT 313 04/16/2020   LYMPHOPCT 7 04/13/2020   MONOPCT 12 04/13/2020   EOSPCT 0 04/13/2020   BASOPCT 0 04/13/2020    CMP:  Lab Results  Component Value Date   NA 128  (L) 04/17/2020   K 3.6 04/17/2020   CL 91 (L) 04/17/2020   CO2 28 04/17/2020   BUN 8 04/17/2020   CREATININE 0.63 04/17/2020   PROT 5.7 (L) 04/13/2020   ALBUMIN 3.0 (L) 04/17/2020   BILITOT 1.3 (H) 04/13/2020   ALKPHOS 25 (L) 04/13/2020   AST 34 04/13/2020   ALT 21 04/13/2020  .   Total Discharge time is about 33 minutes  Roxan Hockey M.D on 04/17/2020 at 6:14 PM  Go to [email or web address] -  for contact info  [facility] - Office  [phone]

## 2020-04-17 NOTE — Discharge Instructions (Signed)
1)Avoid ibuprofen/Advil/Aleve/Motrin/Goody Powders/Naproxen/BC powders/Meloxicam/Diclofenac/Indomethacin and other Nonsteroidal anti-inflammatory medications as these will make you more likely to bleed and can cause stomach ulcers, can also cause Kidney problems.   2) repeat CBC and BMP blood test with primary care physician within a week  3) follow-up with general surgeon Dr. Constance Haw as previously advised  4)-Please follow-up with Urologist Dr. Alyson Ingles in about  1 weeks  for recheck and follow-up evaluation  in his office----[facility], [address], [address], [place] [address] Phone Number----[phone]

## 2020-04-17 NOTE — Progress Notes (Signed)
Physical Therapy Treatment Patient Details Name: Jeffrey Gordon MRN: [id number] DOB: [birth year] Today's Date: 04/17/2020    History of Present Illness Jeffrey Gordon is a 64 y.o. male s/p Right inguinal hernia repair with mesh, excision of cord lipoma 04/11/20 with medical history significant for COPD, hypothyroidism, GERD, hypertension, and anxiety/depression who has undergone recent right inguinal hernia repair on 04/10/2020.  He reports that since his surgery he has had increasing fatigue with weakness as well as nausea and vomiting.  He continues to have mild lower abdominal pain that has remained unchanged since the surgery.  He also notes some swelling to his groin and scrotal region that has also worsened since the surgery.  He is currently unable to eat and has not had a bowel movement since Friday morning.  He denies any fevers or chills and has not had any chest pain or shortness of breath.  He denies any falls or recent injury.    PT Comments    Patient able to complete bed mobility with slow, labored movements without assist. He transfers to standing and is initially unsteady without AD which improves with UE support with RW. Patient ambulates with slow, labored cadence with decreased stride length today but is able to ambulate increased distance today. Patient will need a RW for home use at discharge for safety with standing/ambulating as patient is unsteady without RW today. Patient returned to bed at end of session. Patient will benefit from continued physical therapy in hospital and recommended venue below to increase strength, balance, endurance for safe ADLs and gait.   Follow Up Recommendations  Home health PT;Supervision for mobility/OOB;Supervision - Intermittent     Equipment Recommendations  Rolling walker with 5" wheels    Recommendations for Other Services       Precautions / Restrictions Precautions Precautions: Fall Restrictions Weight Bearing Restrictions: No     Mobility  Bed Mobility Overal bed mobility: Modified Independent             General bed mobility comments: increased time, labored movement  Transfers Overall transfer level: Needs assistance Equipment used: Rolling walker (2 wheeled) Transfers: Sit to/from [facility] Sit to Stand: Min guard Stand pivot transfers: Min guard       General transfer comment: increased time, labored movement  Ambulation/Gait Ambulation/Gait assistance: Min guard Gait Distance (Feet): 110 Feet Assistive device: Rolling walker (2 wheeled) Gait Pattern/deviations: Decreased step length - right;Decreased step length - left;Decreased stride length Gait velocity: decreased   General Gait Details: slow labored cadence, unsteady on feet requiring use of RW for safety   Stairs             Wheelchair Mobility    Modified Rankin (Stroke Patients Only)       Balance Overall balance assessment: Needs assistance Sitting-balance support: Feet supported;No upper extremity supported Sitting balance-Leahy Scale: Good Sitting balance - Comments: seated at EOB   Standing balance support: During functional activity;Bilateral upper extremity supported Standing balance-Leahy Scale: Fair Standing balance comment: using RW                            Cognition Arousal/Alertness: Awake/alert Behavior During Therapy: WFL for tasks assessed/performed Overall Cognitive Status: Within Functional Limits for tasks assessed                                 General Comments: slightly  lethargic throughout session      Exercises      General Comments        Pertinent Vitals/Pain Pain Score: 1  Pain Location: groin area at surgical site Pain Descriptors / Indicators: Sore Pain Intervention(s): Limited activity within patient's tolerance;Monitored during session    Home Living                      Prior Function            PT Goals  (current goals can now be found in the care plan section) Acute Rehab PT Goals Patient Stated Goal: return home with family to assist PT Goal Formulation: With patient Time For Goal Achievement: 04/23/20 Potential to Achieve Goals: Good Progress towards PT goals: Progressing toward goals    Frequency    Min 3X/week      PT Plan Current plan remains appropriate    Co-evaluation              AM-PAC PT "6 Clicks" Mobility   Outcome Measure  Help needed turning from your back to your side while in a flat bed without using bedrails?: None Help needed moving from lying on your back to sitting on the side of a flat bed without using bedrails?: A Little Help needed moving to and from a bed to a chair (including a wheelchair)?: A Little Help needed standing up from a chair using your arms (e.g., wheelchair or bedside chair)?: A Little Help needed to walk in hospital room?: A Little Help needed climbing 3-5 steps with a railing? : A Lot 6 Click Score: 18    End of Session Equipment Utilized During Treatment: Gait belt Activity Tolerance: Patient tolerated treatment well;Patient limited by fatigue Patient left: in chair;with call bell/phone within reach;with family/visitor present Nurse Communication: Mobility status PT Visit Diagnosis: Unsteadiness on feet (R26.81);Other abnormalities of gait and mobility (R26.89);Muscle weakness (generalized) (M62.81)     Time: 9233-0076 PT Time Calculation (min) (ACUTE ONLY): 23 min  Charges:  $Therapeutic Activity: 23-37 mins                     12:15 PM, 04/17/20 Jeffrey Gordon PT, DPT Physical Therapist at [facility]

## 2020-04-17 NOTE — Progress Notes (Signed)
Patient ID: Jeffrey Gordon, male   DOB: 1956-02-26, 64 y.o.   MRN: [id number] S: Feels well.  No new complaints. O:BP 136/65 (BP Location: Right Arm)   Pulse 79   Temp 98.2 F (36.8 C)   Resp 18   Ht 6\' 3"  (1.905 m)   Wt 110.3 kg   SpO2 100%   BMI 30.39 kg/m   Intake/Output Summary (Last 24 hours) at 04/17/2020 0924 Last data filed at 04/17/2020 0555 Gross per 24 hour  Intake --  Output 2800 ml  Net -2800 ml   Intake/Output: I/O last 3 completed shifts: In: 518.7 [I.V.:495.5; IV Piggyback:23.2] Out: 7000 [Urine:7000]  Intake/Output this shift:  No intake/output data recorded. Weight change:  Gen: NAD CVS: RRR, no rub Resp: cta Abd: +BS, soft, NT/ND Ext: no edema GU: groin/scrotal hematoma/ecchymosis improving.   Recent Labs  Lab 04/13/20 1244 04/13/20 1351 04/14/20 0102 04/14/20 0102 04/14/20 0324 04/14/20 0324 04/14/20 0533 04/14/20 0533 04/14/20 0726 04/14/20 6789 04/15/20 0432 04/15/20 0432 04/15/20 1106 04/15/20 1544 04/15/20 2044 04/16/20 0112 04/16/20 0527 04/16/20 0905 04/16/20 1323  NA 109*   < > 111*   < > 112*   < > 113*   < > 112*   < > 116*   < > 115* 117* 123* 125* 127* 126* 127*  K 3.8   < > 3.4*   < > 3.6  --  3.5  --  3.6  --  3.2*  --   --   --   --  3.6 4.0  --  3.2*  CL 79*   < > 83*  --  81*  --  83*  --  82*  --  83*  --   --   --   --   --  93*  --  93*  CO2 21*   < > 19*  --  20*  --  20*  --  21*  --  23  --   --   --   --   --  24  --  25  GLUCOSE 114*   < > 92  --  99  --  99  --  104*  --  123*  --   --   --   --   --  110*  --  196*  BUN 10   < > 8  --  8  --  8  --  7*  --  5*  --   --   --   --   --  8  --  12  CREATININE 0.70   < > 0.63  --  0.65  --  0.58*  --  0.65  --  0.52*  --   --   --   --   --  0.66  --  0.72  ALBUMIN 3.5  --   --   --   --   --   --   --   --   --  3.3*  --   --   --   --   --  3.1*  --   --   CALCIUM 8.0*   < > 7.8*  --  7.7*  --  7.9*  --  7.9*  --  8.3*  --   --   --   --   --  8.2*  --  8.2*  PHOS   --   --   --   --   --   --   --   --   --   --  1.8*  --   --   --   --  2.3* 2.9  --   --   AST 34  --   --   --   --   --   --   --   --   --   --   --   --   --   --   --   --   --   --   ALT 21  --   --   --   --   --   --   --   --   --   --   --   --   --   --   --   --   --   --    < > = values in this interval not displayed.   Liver Function Tests: Recent Labs  Lab 04/13/20 1244 04/15/20 0432 04/16/20 0527  AST 34  --   --   ALT 21  --   --   ALKPHOS 25*  --   --   BILITOT 1.3*  --   --   PROT 5.7*  --   --   ALBUMIN 3.5 3.3* 3.1*   No results for input(s): LIPASE, AMYLASE in the last 168 hours. No results for input(s): AMMONIA in the last 168 hours. CBC: Recent Labs  Lab 04/13/20 1244 04/13/20 1351 04/14/20 0533 04/14/20 0726 04/14/20 1344 04/15/20 0432 04/16/20 1323  WBC 13.4*   < > 11.3*  --   --  12.4* 11.7*  NEUTROABS 10.4*  --   --   --   --   --   --   HGB 8.5*   < > 8.4*   < > 7.9* 8.6* 8.8*  HCT 23.8*   < > 23.5*   < > 22.1* 24.3* 26.2*  MCV 84.7  --  84.2  --   --  86.5 89.4  PLT 194   < > 216  --   --  241 313   < > = values in this interval not displayed.   Cardiac Enzymes: No results for input(s): CKTOTAL, CKMB, CKMBINDEX, TROPONINI in the last 168 hours. CBG: No results for input(s): GLUCAP in the last 168 hours.  Iron Studies: No results for input(s): IRON, TIBC, TRANSFERRIN, FERRITIN in the last 72 hours. Studies/Results: No results found. . Chlorhexidine Gluconate Cloth  6 each Topical Daily  . latanoprost  1 drop Both Eyes QHS  . levothyroxine  88 mcg Oral Q0600  . mometasone-formoterol  2 puff Inhalation BID  . pantoprazole  40 mg Oral Daily  . polyethylene glycol  17 g Oral BID  . senna-docusate  2 tablet Oral BID  . tamsulosin  0.4 mg Oral BID    BMET    Component Value Date/Time   NA 127 (L) 04/16/2020 1323   K 3.2 (L) 04/16/2020 1323   CL 93 (L) 04/16/2020 1323   CO2 25 04/16/2020 1323   GLUCOSE 196 (H) 04/16/2020 1323    BUN 12 04/16/2020 1323   CREATININE 0.72 04/16/2020 1323   CALCIUM 8.2 (L) 04/16/2020 1323   GFRNONAA >60 04/16/2020 1323   GFRAA >60 04/16/2020 1323   CBC    Component Value Date/Time   WBC 11.7 (H) 04/16/2020 1323   RBC 2.93 (L) 04/16/2020 1323   HGB 8.8 (L) 04/16/2020 1323   HCT 26.2 (L) 04/16/2020 1323   PLT 313 04/16/2020 1323   MCV 89.4  04/16/2020 1323   MCH 30.0 04/16/2020 1323   MCHC 33.6 04/16/2020 1323   RDW 14.4 04/16/2020 1323   LYMPHSABS 0.9 04/13/2020 1244   MONOABS 1.7 (H) 04/13/2020 1244   EOSABS 0.0 04/13/2020 1244   BASOSABS 0.0 04/13/2020 1244   Assessment/Plan: 1. Severe symptomatic Hyponatremia- most consistent with hypovolemic hyponatremia (volume depletion as well as ABLA in setting of SSRI and ARB therapy). 1. Na improved to 127, 3% saline stopped last night 2. Continue to follow sodium level off of hypertonic saline. 3. Continue to hold losartan for now 4. If sodium remains stable ok to resume prozac given his history of severe depression 5. Need to follow sodium levelsevery4hours for now 6. TSHand cortisolnormal. Lowurine Na, elevatedurine Osmcompared toserum osmsupports volume depletion as cause of hyponatremia. 2. ABLA- hgb dropped from 14.2 to 7.5 with large ecchymoses of scrotum and groin. Transfuse prn.  Hgb up to 8.8.  Surgery following 3. Constipation and abdominal pain-CT scan without evidence ofileus or obstruction. Will need laxatives and stool softeners per primary. 4. Inguinal hernia repair- with hematoma/ecchymoses per surgery 5. HTN- hold losartan for now 6. Hypothyroidism- TSH WNL 7. GERD- on PPI  Donetta Potts, MD [employer] [phone]

## 2020-04-17 NOTE — Progress Notes (Signed)
[facility]  Doing well and trying to have BM. Swelling improved and bruising improved. Hopefully going home today. Will see on Thursday to check wound and see how he is doing. Office made aware.  Curlene Labrum, MD [facility] [address] [place], [place] [address] [phone] (office)

## 2020-04-17 NOTE — Progress Notes (Signed)
Patient unable to void after foley catheter removal, multiple attempts to void per patient report and assist from nursing staff. Order received to insert foley catheter and patient will discharge home with foley catheter.

## 2020-04-17 NOTE — Care Management Important Message (Signed)
Important Message  Patient Details  Name: Jeffrey Gordon MRN: [id number] Date of Birth: May 15, 1956   Medicare Important Message Given:  Yes     Tommy Medal 04/17/2020, 2:00 PM

## 2020-04-18 LAB — CULTURE, BLOOD (ROUTINE X 2)
Culture: NO GROWTH
Culture: NO GROWTH
Special Requests: ADEQUATE
Special Requests: ADEQUATE

## 2020-04-20 ENCOUNTER — Ambulatory Visit
Admission: EM | Admit: 2020-04-20 | Discharge: 2020-04-20 | Disposition: A | Discharge status: Home / Self Care | Payer: Medicare Other | Attending: Emergency Medicine | Admitting: Emergency Medicine

## 2020-04-20 DIAGNOSIS — R509 Fever, unspecified: Secondary | ICD-10-CM | POA: Diagnosis not present

## 2020-04-20 DIAGNOSIS — T83511A Infection and inflammatory reaction due to indwelling urethral catheter, initial encounter: Secondary | ICD-10-CM | POA: Insufficient documentation

## 2020-04-20 DIAGNOSIS — N390 Urinary tract infection, site not specified: Secondary | ICD-10-CM | POA: Insufficient documentation

## 2020-04-20 LAB — POCT URINALYSIS DIP (MANUAL ENTRY)
Bilirubin, UA: NEGATIVE
Glucose, UA: NEGATIVE mg/dL
Ketones, POC UA: NEGATIVE mg/dL
Nitrite, UA: POSITIVE — AB
Protein Ur, POC: 30 mg/dL — AB
Spec Grav, UA: 1.02 (ref 1.010–1.025)
Urobilinogen, UA: 4 E.U./dL — AB
pH, UA: 8 (ref 5.0–8.0)

## 2020-04-20 MED ORDER — SULFAMETHOXAZOLE-TRIMETHOPRIM 800-160 MG PO TABS: 1.0000 | ORAL_TABLET | Freq: Two times a day (BID) | ORAL | 0 refills | Status: DC

## 2020-04-20 MED ORDER — CHLORPROMAZINE HCL 10 MG PO TABS
10.0000 mg | ORAL_TABLET | Freq: Two times a day (BID) | ORAL | 0 refills | Status: DC | PRN
Start: 2020-04-20 — End: 2021-01-13

## 2020-04-20 MED ORDER — CEFTRIAXONE SODIUM 1 G IJ SOLR
1.0000 g | Freq: Once | INTRAMUSCULAR | Status: AC
  Administered 2020-04-20: 1 g via INTRAMUSCULAR

## 2020-04-20 NOTE — ED Provider Notes (Addendum)
[facility]   Chief Complaint  Patient presents with  . Fever     SUBJECTIVE:  Jeffrey Gordon is a 64 y.o. male  who presented to the urgent care with a complaint of fever and difficulty urinating.  Patient is discharged from hospital this past Friday then developed fever 2 days after.  He has a Foley catheter removed on Friday and then reinserted due to inability to urinate before discharge. Currently denies flank or abdominal pain..  Symptoms are made worse with urination.  Denies similar symptoms in the past.  Denies  nausea, vomiting, abdominal pain, flank pain.  LMP: No LMP for male patient.  ROS: As in HPI.  All other pertinent ROS negative.     Past Medical History:  Diagnosis Date  . Allergy   . Anxiety   . Blurred vision, left eye   . Cataract   . COPD (chronic obstructive pulmonary disease) ([place])   . Depression   . GERD (gastroesophageal reflux disease)   . High cholesterol   . Hypertension   . Hypothyroidism    Past Surgical History:  Procedure Laterality Date  . CATARACT EXTRACTION W/ INTRAOCULAR LENS  IMPLANT, BILATERAL     2002 (left); 2012 (right)  . COLONOSCOPY    . EYE SURGERY    . [place]   left  . INGUINAL HERNIA REPAIR Right 04/10/2020   Procedure: HERNIA REPAIR INGUINAL ADULT;  Surgeon: Virl Cagey, MD;  Location: [facility];  Service: General;  Laterality: Right;  . PARS PLANA VITRECTOMY  10/20/11   "took out the old lens implant and put a new one in"  . PARS PLANA VITRECTOMY  10/20/2011   Procedure: PARS PLANA VITRECTOMY WITH 25G REMOVAL/SUTURE INTRAOCULAR LENS;  Surgeon: Hayden Pedro, MD;  Location: [place];  Service: Ophthalmology;  Laterality: Left;  . RETINAL DETACHMENT SURGERY  2002   left  . RETINAL DETACHMENT SURGERY  2003   "repair; left"   Allergies  Allergen Reactions  . Durezol [Difluprednate] Other (See Comments)    "Pressure increased in my eye"  . Advair Diskus [Fluticasone-Salmeterol] Other  (See Comments)    Issues sleeping   No current facility-administered medications on file prior to encounter.   Current Outpatient Medications on File Prior to Encounter  Medication Sig Dispense Refill  . acetaminophen (TYLENOL) 325 MG tablet Take 2 tablets (650 mg total) by mouth every 6 (six) hours as needed for mild pain (or Fever >/= 101). 12 tablet 2  . aspirin EC 81 MG tablet Take 1 tablet (81 mg total) by mouth daily with breakfast. 30 tablet 11  . brimonidine-timolol (COMBIGAN) 0.2-0.5 % ophthalmic solution Place 1 drop into the right eye in the morning and at bedtime.     . chlorproMAZINE (THORAZINE) 10 MG tablet Take 1 tablet (10 mg total) by mouth every 12 (twelve) hours as needed for hiccoughs. 15 tablet 0  . CO ENZYME Q-10 PO Take 1 capsule by mouth daily.     Marland Kitchen FLUoxetine (PROZAC) 40 MG capsule Take 40 mg by mouth daily.    Marland Kitchen latanoprost (XALATAN) 0.005 % ophthalmic solution Place 1 drop into both eyes at bedtime.     Marland Kitchen levothyroxine (SYNTHROID, LEVOTHROID) 88 MCG tablet Take 88 mcg by mouth daily before breakfast.     . Multiple Vitamins-Minerals (PRESERVISION AREDS 2 PO) Take 1 capsule by mouth in the morning and at bedtime.    Marland Kitchen omeprazole (PRILOSEC) 20 MG capsule Take  20 mg by mouth daily.    . ondansetron (ZOFRAN) 4 MG tablet Take 1 tablet (4 mg total) by mouth every 8 (eight) hours as needed. 30 tablet 1  . oxyCODONE (ROXICODONE) 5 MG immediate release tablet Take 1 tablet (5 mg total) by mouth every 4 (four) hours as needed for severe pain or breakthrough pain. 20 tablet 0  . polyethylene glycol (MIRALAX / GLYCOLAX) 17 g packet Take 17 g by mouth daily. 30 each 2  . PROAIR HFA 108 (90 Base) MCG/ACT inhaler Inhale 1-2 puffs into the lungs every 6 (six) hours as needed.     . rosuvastatin (CRESTOR) 10 MG tablet Take 10 mg by mouth daily.    Marland Kitchen senna-docusate (SENOKOT-S) 8.6-50 MG tablet Take 2 tablets by mouth at bedtime. 60 tablet 2  . sildenafil (VIAGRA) 100 MG tablet Take  100 mg by mouth daily as needed.     . SYMBICORT 160-4.5 MCG/ACT inhaler INHALE 2 PUFFS INTO THE LUNGS TWICE DAILY (Patient taking differently: Inhale 2 puffs into the lungs in the morning and at bedtime. ) 10.2 g 5  . tamsulosin (FLOMAX) 0.4 MG CAPS capsule Take 1 capsule (0.4 mg total) by mouth 2 (two) times daily. 60 capsule 2   Social History   Socioeconomic History  . Marital status: Married    Spouse name: Not on file  . Number of children: Not on file  . Years of education: Not on file  . Highest education level: Not on file  Occupational History  . Not on file  Tobacco Use  . Smoking status: Never Smoker  . Smokeless tobacco: Never Used  . Tobacco comment: "tried smoking briefly as a teen"  Substance and Sexual Activity  . Alcohol use: Not Currently    Comment: 10/20/11 "last alcohol ` 2010"  . Drug use: No  . Sexual activity: Yes  Other Topics Concern  . Not on file  Social History Narrative  . Not on file   Social Determinants of Health   Financial Resource Strain:   . Difficulty of Paying Living Expenses: Not on file  Food Insecurity:   . Worried About Charity fundraiser in the Last Year: Not on file  . Ran Out of Food in the Last Year: Not on file  Transportation Needs:   . Lack of Transportation (Medical): Not on file  . Lack of Transportation (Non-Medical): Not on file  Physical Activity:   . Days of Exercise per Week: Not on file  . Minutes of Exercise per Session: Not on file  Stress:   . Feeling of Stress : Not on file  Social Connections:   . Frequency of Communication with Friends and Family: Not on file  . Frequency of Social Gatherings with Friends and Family: Not on file  . Attends Religious Services: Not on file  . Active Member of Clubs or Organizations: Not on file  . Attends Archivist Meetings: Not on file  . Marital Status: Not on file  Intimate Partner Violence:   . Fear of Current or Ex-Partner: Not on file  . Emotionally  Abused: Not on file  . Physically Abused: Not on file  . Sexually Abused: Not on file   Family History  Problem Relation Age of Onset  . Colon cancer Father   . Esophageal cancer Neg Hx   . Rectal cancer Neg Hx   . Stomach cancer Neg Hx     OBJECTIVE:  Vitals:   04/20/20 1820  BP: (!) 174/91  Pulse: (!) 101  Resp: 20  Temp: (!) 100.5 F (38.1 C)  SpO2: 95%   General appearance: AOx3 in no acute distress HEENT: NCAT.  Oropharynx clear.  Lungs: clear to auscultation bilaterally without adventitious breath sounds Heart: regular rate and rhythm.  Radial pulses 2+ symmetrical bilaterally Abdomen: soft; non-distended; no tenderness; bowel sounds present; no guarding or rebound tenderness Back: no CVA tenderness Uro: Foley catheter in place Extremities: no edema; symmetrical with no gross deformities Skin: warm and dry Neurologic: Ambulates from chair to exam table without difficulty Psychological: alert and cooperative; normal mood and affect  Labs Reviewed  POCT URINALYSIS DIP (MANUAL ENTRY) - Abnormal; Notable for the following components:      Result Value   Clarity, UA cloudy (*)    Blood, UA moderate (*)    Protein Ur, POC =30 (*)    Urobilinogen, UA 4.0 (*)    Nitrite, UA Positive (*)    Leukocytes, UA Large (3+) (*)    All other components within normal limits  URINE CULTURE    ASSESSMENT & PLAN:  1. Urinary tract infection associated with indwelling urethral catheter, initial encounter ([place])   2. Fever, unspecified     Meds ordered this encounter  Medications  . cefTRIAXone (ROCEPHIN) injection 1 g  . DISCONTD: sulfamethoxazole-trimethoprim (BACTRIM DS) 800-160 MG tablet    Sig: Take 1 tablet by mouth 2 (two) times daily for 7 days.    Dispense:  14 tablet    Refill:  0  . sulfamethoxazole-trimethoprim (BACTRIM DS) 800-160 MG tablet    Sig: Take 1 tablet by mouth 2 (two) times daily for 10 days.    Dispense:  20 tablet    Refill:  0   Discharge  instructions  Urine culture sent.  We will call you with abnormal results.   Push fluids and get plenty of rest. Rocephin 1 g IM was given in office Take antibiotic as directed and to completion Follow up with PCP if symptoms persists Return here or go to ER if you have any new or worsening symptoms such as fever, worsening abdominal pain, nausea/vomiting, flank pain, etc...  Outlined signs and symptoms indicating need for more acute intervention. Patient verbalized understanding. After Visit Summary given.     Note: This document was prepared using Dragon voice recognition software and may include unintentional dictation errors.    Emerson Monte, FNP 04/20/20 1906    Emerson Monte, FNP 04/20/20 1908

## 2020-04-20 NOTE — ED Triage Notes (Signed)
Pt discharged from hospital on Friday, then developed fever, foley was removed on Friday  But was re inserted due to pat inability to urinate

## 2020-04-20 NOTE — Discharge Instructions (Addendum)
Urine culture sent.  We will call you with abnormal results.   Push fluids and get plenty of rest. Rocephin 1 g IM was given in office Take antibiotic as directed and to completion Follow up with PCP if symptoms persists Return here or go to ER if you have any new or worsening symptoms such as fever, worsening abdominal pain, nausea/vomiting, flank pain, etc..Marland Kitchen

## 2020-04-21 ENCOUNTER — Encounter: Payer: Self-pay | Admitting: Urology

## 2020-04-21 ENCOUNTER — Other Ambulatory Visit: Payer: Self-pay

## 2020-04-21 ENCOUNTER — Ambulatory Visit (INDEPENDENT_AMBULATORY_CARE_PROVIDER_SITE_OTHER): Payer: Medicare Other | Admitting: Urology

## 2020-04-21 VITALS — BP 132/79 | HR 88 | Temp 98.2°F | Ht 75.0 in | Wt 243.2 lb

## 2020-04-21 DIAGNOSIS — N138 Other obstructive and reflux uropathy: Secondary | ICD-10-CM | POA: Insufficient documentation

## 2020-04-21 DIAGNOSIS — R339 Retention of urine, unspecified: Secondary | ICD-10-CM | POA: Diagnosis not present

## 2020-04-21 DIAGNOSIS — N401 Enlarged prostate with lower urinary tract symptoms: Secondary | ICD-10-CM | POA: Insufficient documentation

## 2020-04-21 NOTE — Progress Notes (Signed)
04/21/2020 3:23 PM   Jeffrey Gordon [birth year] [id number]  Referring provider: Celene Squibb, MD [address] [place],  [place] [address]  Urinary retention  HPI: Jeffrey Gordon is a 64yo here for evaluation of urinary retention. He underwent right inguinal hernia repair on 8/27. He then developed scrotal hematoma and worsening fatigue. He presented to the ER on 8/30 and was admitted for hyponatremia and urinary retention. He had a foley placed during that hospitalization he failed multiple voiding trials. He was then discharged with foley and was doing well until yesterday when he developed dysuria and fevers. He was started on 10 day course of antibiotics yesterday. He previously saw Dr. Risa Grill for BPH. He was on cardura and now he is on flomax BID.   PMH: Past Medical History:  Diagnosis Date  . Acid reflux   . Allergy   . Anxiety   . Asthma   . Blurred vision, left eye   . Cataract   . COPD (chronic obstructive pulmonary disease) ([place])   . Depression   . GERD (gastroesophageal reflux disease)   . High cholesterol   . Hypertension   . Hypothyroidism   . Hypothyroidism     Surgical History: Past Surgical History:  Procedure Laterality Date  . CATARACT EXTRACTION W/ INTRAOCULAR LENS  IMPLANT, BILATERAL     2002 (left); 2012 (right)  . COLONOSCOPY    . EYE SURGERY    . [place]   left  . INGUINAL HERNIA REPAIR Right 04/10/2020   Procedure: HERNIA REPAIR INGUINAL ADULT;  Surgeon: Virl Cagey, MD;  Location: [facility];  Service: General;  Laterality: Right;  . PARS PLANA VITRECTOMY  10/20/11   "took out the old lens implant and put a new one in"  . PARS PLANA VITRECTOMY  10/20/2011   Procedure: PARS PLANA VITRECTOMY WITH 25G REMOVAL/SUTURE INTRAOCULAR LENS;  Surgeon: Hayden Pedro, MD;  Location: [facility];  Service: Ophthalmology;  Laterality: Left;  . RETINAL DETACHMENT SURGERY  2002   left  . RETINAL DETACHMENT SURGERY  2003   "repair; left"     Home Medications:  Allergies as of 04/21/2020      Reactions   Durezol [difluprednate] Other (See Comments)   "Pressure increased in my eye"   Advair Diskus [fluticasone-salmeterol] Other (See Comments)   Issues sleeping      Medication List       Accurate as of April 21, 2020  3:23 PM. If you have any questions, ask your nurse or doctor.        acetaminophen 325 MG tablet Commonly known as: TYLENOL Take 2 tablets (650 mg total) by mouth every 6 (six) hours as needed for mild pain (or Fever >/= 101).   aspirin EC 81 MG tablet Take 1 tablet (81 mg total) by mouth daily with breakfast.   chlorproMAZINE 10 MG tablet Commonly known as: THORAZINE Take 1 tablet (10 mg total) by mouth 2 (two) times daily as needed.   CO ENZYME Q-10 PO Take 1 capsule by mouth daily.   Combigan 0.2-0.5 % ophthalmic solution Generic drug: brimonidine-timolol Place 1 drop into the right eye in the morning and at bedtime.   FLUoxetine 40 MG capsule Commonly known as: PROZAC Take 40 mg by mouth daily.   latanoprost 0.005 % ophthalmic solution Commonly known as: XALATAN Place 1 drop into both eyes at bedtime.   levothyroxine 88 MCG tablet Commonly known as: SYNTHROID Take 88 mcg  by mouth daily before breakfast.   omeprazole 20 MG capsule Commonly known as: PRILOSEC Take 20 mg by mouth daily.   ondansetron 4 MG tablet Commonly known as: Zofran Take 1 tablet (4 mg total) by mouth every 8 (eight) hours as needed.   oxyCODONE 5 MG immediate release tablet Commonly known as: Roxicodone Take 1 tablet (5 mg total) by mouth every 4 (four) hours as needed for severe pain or breakthrough pain.   polyethylene glycol 17 g packet Commonly known as: MIRALAX / GLYCOLAX Take 17 g by mouth daily.   PRESERVISION AREDS 2 PO Take 1 capsule by mouth in the morning and at bedtime.   ProAir HFA 108 (90 Base) MCG/ACT inhaler Generic drug: albuterol Inhale 1-2 puffs into the lungs every 6 (six)  hours as needed.   rosuvastatin 10 MG tablet Commonly known as: CRESTOR Take 10 mg by mouth daily.   senna-docusate 8.6-50 MG tablet Commonly known as: Senokot-S Take 2 tablets by mouth at bedtime.   sildenafil 100 MG tablet Commonly known as: VIAGRA Take 100 mg by mouth daily as needed.   sulfamethoxazole-trimethoprim 800-160 MG tablet Commonly known as: BACTRIM DS Take 1 tablet by mouth 2 (two) times daily for 10 days.   Symbicort 160-4.5 MCG/ACT inhaler Generic drug: budesonide-formoterol INHALE 2 PUFFS INTO THE LUNGS TWICE DAILY What changed: when to take this   tamsulosin 0.4 MG Caps capsule Commonly known as: FLOMAX Take 1 capsule (0.4 mg total) by mouth 2 (two) times daily.       Allergies:  Allergies  Allergen Reactions  . Durezol [Difluprednate] Other (See Comments)    "Pressure increased in my eye"  . Advair Diskus [Fluticasone-Salmeterol] Other (See Comments)    Issues sleeping    Family History: Family History  Problem Relation Age of Onset  . Colon cancer Father   . Esophageal cancer Neg Hx   . Rectal cancer Neg Hx   . Stomach cancer Neg Hx     Social History:  reports that he has never smoked. He has never used smokeless tobacco. He reports previous alcohol use. He reports that he does not use drugs.  ROS: All other review of systems were reviewed and are negative except what is noted above in HPI  Physical Exam: BP 132/79   Pulse 88   Temp 98.2 F (36.8 C)   Ht 6\' 3"  (1.905 m)   Wt 243 lb 2.7 oz (110.3 kg)   BMI 30.39 kg/m   Constitutional:  Alert and oriented, No acute distress. HEENT: [place] AT, moist mucus membranes.  Trachea midline, no masses. Cardiovascular: No clubbing, cyanosis, or edema. Respiratory: Normal respiratory effort, no increased work of breathing. GI: Abdomen is soft, nontender, nondistended, no abdominal masses GU: No CVA tenderness. Circumcised phallus buried by hematoma. No masses/lesions on penis, testis, scrotum.  Large right scrotal hematoma. Moderate edema Lymph: No cervical or inguinal lymphadenopathy. Skin: No rashes, bruises or suspicious lesions. Neurologic: Grossly intact, no focal deficits, moving all 4 extremities. Psychiatric: Normal mood and affect.  Laboratory Data: Lab Results  Component Value Date   WBC 11.7 (H) 04/16/2020   HGB 8.8 (L) 04/16/2020   HCT 26.2 (L) 04/16/2020   MCV 89.4 04/16/2020   PLT 313 04/16/2020    Lab Results  Component Value Date   CREATININE 0.63 04/17/2020    Lab Results  Component Value Date   PSA Dr. Oswaldo Milian follows 04/24/2008   PSA normal (0.58) 10/28/2006    No results found for:  TESTOSTERONE  No results found for: HGBA1C  Urinalysis    Component Value Date/Time   COLORURINE YELLOW 04/13/2020 1120   APPEARANCEUR CLEAR 04/13/2020 1120   LABSPEC 1.024 04/13/2020 1120   PHURINE 6.0 04/13/2020 1120   GLUCOSEU NEGATIVE 04/13/2020 1120   HGBUR NEGATIVE 04/13/2020 1120   HGBUR negative 04/24/2008 0820   BILIRUBINUR negative 04/20/2020 1836   KETONESUR negative 04/20/2020 1836   KETONESUR 20 (A) 04/13/2020 1120   PROTEINUR =30 (A) 04/20/2020 1836   PROTEINUR 30 (A) 04/13/2020 1120   UROBILINOGEN 4.0 (A) 04/20/2020 1836   UROBILINOGEN 0.2 04/24/2008 0820   NITRITE Positive (A) 04/20/2020 1836   NITRITE NEGATIVE 04/13/2020 1120   LEUKOCYTESUR Large (3+) (A) 04/20/2020 1836   LEUKOCYTESUR NEGATIVE 04/13/2020 1120    Lab Results  Component Value Date   BACTERIA NONE SEEN 04/13/2020    Pertinent Imaging:  No results found for this or any previous visit.  No results found for this or any previous visit.  No results found for this or any previous visit.  No results found for this or any previous visit.  No results found for this or any previous visit.  No results found for this or any previous visit.  No results found for this or any previous visit.  No results found for this or any previous visit.   Assessment & Plan:     1. Benign prostatic hyperplasia with urinary obstruction -COntinue flomax BID  2. Urinary retention RTC 1 week for a voiding trial   No follow-ups on file.  Nicolette Bang, MD  [facility] [place]

## 2020-04-21 NOTE — Patient Instructions (Signed)

## 2020-04-21 NOTE — Progress Notes (Signed)
Urological Symptom Review  Patient is experiencing the following symptoms: Hard to postpone urination Get up at night to urinate Urinary tract infection Erection problems (male only)   Review of Systems  Gastrointestinal (upper)  : Negative for upper GI symptoms  Gastrointestinal (lower) : Negative for lower GI symptoms  Constitutional : Negative for symptoms  Skin: Negative for skin symptoms  Eyes: Negative for eye symptoms  Ear/Nose/Throat : Negative for Ear/Nose/Throat symptoms  Hematologic/Lymphatic: Negative for Hematologic/Lymphatic symptoms  Cardiovascular : Negative for cardiovascular symptoms  Respiratory : Negative for respiratory symptoms  Endocrine: Negative for endocrine symptoms  Musculoskeletal: Negative for musculoskeletal symptoms  Neurological: Negative for neurological symptoms  Psychologic: Depression Anxiety

## 2020-04-22 DIAGNOSIS — R7303 Prediabetes: Secondary | ICD-10-CM | POA: Diagnosis not present

## 2020-04-22 DIAGNOSIS — E871 Hypo-osmolality and hyponatremia: Secondary | ICD-10-CM | POA: Diagnosis not present

## 2020-04-22 DIAGNOSIS — Z136 Encounter for screening for cardiovascular disorders: Secondary | ICD-10-CM | POA: Diagnosis not present

## 2020-04-23 ENCOUNTER — Ambulatory Visit (INDEPENDENT_AMBULATORY_CARE_PROVIDER_SITE_OTHER): Payer: Medicare Other | Admitting: General Surgery

## 2020-04-23 ENCOUNTER — Other Ambulatory Visit: Payer: Self-pay

## 2020-04-23 ENCOUNTER — Encounter: Payer: Self-pay | Admitting: General Surgery

## 2020-04-23 VITALS — BP 119/77 | HR 85 | Temp 98.4°F | Resp 16 | Ht 75.5 in | Wt 228.0 lb

## 2020-04-23 DIAGNOSIS — K4090 Unilateral inguinal hernia, without obstruction or gangrene, not specified as recurrent: Secondary | ICD-10-CM

## 2020-04-23 LAB — URINE CULTURE: Culture: >=100000 — AB

## 2020-04-23 MED ORDER — DIAZEPAM 2 MG PO TABS: 2.0000 mg | ORAL_TABLET | Freq: Four times a day (QID) | ORAL | 0 refills | Status: DC | PRN

## 2020-04-23 NOTE — Patient Instructions (Addendum)
Keep scrotum elevated. Valium as needed for anxiety, no driving, take with caution. Refills with PCP.  Take antibiotic for UTI. See Dr. Alyson Ingles for foley.

## 2020-04-23 NOTE — Progress Notes (Signed)
[facility]   HPI:  64 y.o. Male presents to clinic for post-op follow-up evaluation after right inguinal hernia repair with mesh complicated by large hematoma and admission to the hospital with hyponatremia. He had foley in the hospital, retention, and then went home with a foley. He had 65 fever and was found to have UTI at urgent care. He saw Dr. Alyson Ingles for retention with plans for foley removal next week.   Review of Systems:  Fever over weekend No incisional pain Improved bruising and swelling All other review of systems: otherwise negative   Vital Signs:  BP 119/77   Pulse 85   Temp 98.4 F (36.9 C) (Oral)   Resp 16   Ht 6' 3.5" (1.918 m)   Wt 228 lb (103.4 kg)   SpO2 96%   BMI 28.12 kg/m    Physical Exam:  Physical Exam Musculoskeletal:     Comments: Significantly improved bruising and evolution of bruising, hematoma in the inguinal region and scrotum remains, soft, no signs of erythema or drainage at incision, dermabond peeling       Laboratory studies: OSH labs       Assessment:  64 y.o. yo Male with post operative hematoma complication after right inguinal hernia repair with mesh and hospitalization for hyponatremia, further complication of UTI with foley indwelling during hospital for retention. Had fever due to UTI and no signs of infection at the incision. Overall improving.   Plan:  Keep scrotum elevated. Valium as needed for anxiety, no driving, take with caution. Refills with PCP.  Take antibiotic for UTI. See Dr. Alyson Ingles for foley.  Future Appointments  Date Time Provider [place]  04/27/2020  8:30 AM AUR-NURSE AUR-AUR None  04/30/2020  2:45 PM Virl Cagey, MD RS-RS None    All of the above recommendations were discussed with the patient and patient's family, and all of patient's and family's questions were answered to their expressed satisfaction.  Curlene Labrum, MD [facility] [address] [place], [place] [address] [phone] (office)

## 2020-04-27 ENCOUNTER — Ambulatory Visit (INDEPENDENT_AMBULATORY_CARE_PROVIDER_SITE_OTHER): Payer: Medicare Other

## 2020-04-27 DIAGNOSIS — R339 Retention of urine, unspecified: Secondary | ICD-10-CM

## 2020-04-27 NOTE — Progress Notes (Signed)
Fill and Pull Catheter Removal  Patient is present today for a catheter removal.  Patient was cleaned and prepped in a sterile fashion 870ml of sterile water/ saline was instilled into the bladder when the patient felt the urge to urinate. 46ml of water was then drained from the balloon.  A 16FR foley cath was removed from the bladder no complications were noted .  Patient was then given some time to void on their own.  Patient can void  818ml on their own after some time.  Patient tolerated well.  Performed by: Nuriya Stuck, LPN  Follow up/ Additional notes: 1 month OV

## 2020-04-30 ENCOUNTER — Other Ambulatory Visit: Payer: Self-pay

## 2020-04-30 ENCOUNTER — Encounter: Payer: Self-pay | Admitting: General Surgery

## 2020-04-30 ENCOUNTER — Ambulatory Visit (INDEPENDENT_AMBULATORY_CARE_PROVIDER_SITE_OTHER): Payer: Medicare Other | Admitting: General Surgery

## 2020-04-30 VITALS — BP 118/77 | HR 80 | Temp 97.5°F | Resp 18 | Ht 75.5 in | Wt 228.0 lb

## 2020-04-30 DIAGNOSIS — K4090 Unilateral inguinal hernia, without obstruction or gangrene, not specified as recurrent: Secondary | ICD-10-CM

## 2020-04-30 DIAGNOSIS — D62 Acute posthemorrhagic anemia: Secondary | ICD-10-CM

## 2020-04-30 NOTE — Patient Instructions (Signed)
Continue to elevate the scrotum.  Call with concern.

## 2020-04-30 NOTE — Progress Notes (Signed)
[facility]   HPI:  64 y.o. Male presents to clinic for follow-up evaluation of his right inguinal hernia with hematoma.  Patient reports he got the foley out Monday and has been voiding. He feels less swollen and is now able to drive and get around. No pain.  Review of Systems:  No fevers or chills Tolerating diet All other review of systems: otherwise negative   Vital Signs:  BP 118/77   Pulse 80   Temp (!) 97.5 F (36.4 C) (Oral)   Resp 18   Ht 6' 3.5" (1.918 m)   Wt 228 lb (103.4 kg)   SpO2 98%   BMI 28.12 kg/m    Physical Exam:  Physical Exam Vitals reviewed.  Cardiovascular:     Rate and Rhythm: Normal rate.  Pulmonary:     Effort: Pulmonary effort is normal.  Abdominal:     General: There is no distension.     Palpations: Abdomen is soft.     Tenderness: There is no abdominal tenderness.     Hernia: No hernia is present.     Comments: Large hematoma in the right inguinal region/ scrotum improving, bruising resolving        Assessment:  64 y.o. yo Male with improving swelling and bruising after hematoma from right inguinal hernia repair with mesh. Doing well overall.  Plan:  Continue to elevate the scrotum.  Call with concern.   Future Appointments  Date Time Provider [place]  05/14/2020  1:45 PM Virl Cagey, MD [facility] None  05/27/2020 11:30 AM McKenzie, Candee Furbish, MD AUR[facility] None      Curlene Labrum, MD [facility] [address] [place], [place] [address] [phone] (office)

## 2020-05-07 DIAGNOSIS — Z136 Encounter for screening for cardiovascular disorders: Secondary | ICD-10-CM | POA: Diagnosis not present

## 2020-05-07 DIAGNOSIS — E871 Hypo-osmolality and hyponatremia: Secondary | ICD-10-CM | POA: Diagnosis not present

## 2020-05-13 DIAGNOSIS — H4061X1 Glaucoma secondary to drugs, right eye, mild stage: Secondary | ICD-10-CM | POA: Diagnosis not present

## 2020-05-13 DIAGNOSIS — H4061X0 Glaucoma secondary to drugs, right eye, stage unspecified: Secondary | ICD-10-CM | POA: Diagnosis not present

## 2020-05-14 ENCOUNTER — Other Ambulatory Visit: Payer: Self-pay

## 2020-05-14 ENCOUNTER — Encounter: Payer: Self-pay | Admitting: General Surgery

## 2020-05-14 ENCOUNTER — Ambulatory Visit (INDEPENDENT_AMBULATORY_CARE_PROVIDER_SITE_OTHER): Payer: Medicare Other | Admitting: General Surgery

## 2020-05-14 VITALS — BP 119/77 | HR 74 | Temp 98.1°F | Resp 14 | Ht 75.5 in | Wt 228.0 lb

## 2020-05-14 DIAGNOSIS — K4090 Unilateral inguinal hernia, without obstruction or gangrene, not specified as recurrent: Secondary | ICD-10-CM

## 2020-05-14 DIAGNOSIS — D62 Acute posthemorrhagic anemia: Secondary | ICD-10-CM

## 2020-05-14 NOTE — Progress Notes (Signed)
[facility] Note   HPI:  64 y.o. Male presents to clinic for follow-up evaluation after right inguinal hernia repair complicated by post operative hematoma, hyponatremia, dehydration and then UTI after foley placed. Doing better and swelling improving. Still with swelling in the scrotum and groin.  Review of Systems:  No fever or chills Overall improving swelling Bruising resolved  All other review of systems: otherwise negative   Vital Signs:  BP 119/77   Pulse 74   Temp 98.1 F (36.7 C) (Oral)   Resp 14   Ht 6' 3.5" (1.918 m)   Wt 228 lb (103.4 kg)   SpO2 96%   BMI 28.12 kg/m    Physical Exam:  Physical Exam Vitals reviewed.  Cardiovascular:     Rate and Rhythm: Normal rate.  Pulmonary:     Effort: Pulmonary effort is normal.  Abdominal:     General: There is no distension.     Palpations: Abdomen is soft.     Tenderness: There is no abdominal tenderness.     Comments: Right inguinal incision c/d/i with peeling dermabond, no erythema or drainage, induration and swelling improving, swelling down into the scrotum, improving, scrotum still about size of grapefruit   Neurological:     Mental Status: He is alert.      Assessment:  64 y.o. yo Male with right inguinal hernia repair with mesh complicated by post operative hematoma and hyponatremia/ dehydration and UTI from foley reinsertion. Doing better now. Overall improving.  Plan:  - Hematoma/ swelling will continue to resolve  - Will see back in 2 months, if not happy with swelling and improvement will get CT to assure no hernia recurrence   Future Appointments  Date Time Provider [place]  05/27/2020 11:30 AM McKenzie, Candee Furbish, MD [facility] None  07/16/2020  1:15 PM Virl Cagey, MD RS-RS None     Curlene Labrum, MD [facility] [address] [place], [place] [address] [phone] (office)

## 2020-05-14 NOTE — Patient Instructions (Signed)
Overall improving. Can start to lift between 10-20 lbs for next 2- 3 weeks. At 8 weeks can go back to normal activity.

## 2020-05-27 ENCOUNTER — Encounter: Payer: Self-pay | Admitting: Urology

## 2020-05-27 ENCOUNTER — Other Ambulatory Visit: Payer: Self-pay

## 2020-05-27 ENCOUNTER — Ambulatory Visit (INDEPENDENT_AMBULATORY_CARE_PROVIDER_SITE_OTHER): Payer: Medicare Other | Admitting: Urology

## 2020-05-27 VITALS — BP 151/91 | HR 75 | Temp 98.2°F | Ht 75.5 in | Wt 228.0 lb

## 2020-05-27 DIAGNOSIS — N138 Other obstructive and reflux uropathy: Secondary | ICD-10-CM | POA: Diagnosis not present

## 2020-05-27 DIAGNOSIS — R339 Retention of urine, unspecified: Secondary | ICD-10-CM | POA: Diagnosis not present

## 2020-05-27 DIAGNOSIS — N401 Enlarged prostate with lower urinary tract symptoms: Secondary | ICD-10-CM

## 2020-05-27 LAB — URINALYSIS, ROUTINE W REFLEX MICROSCOPIC
Bilirubin, UA: NEGATIVE
Glucose, UA: NEGATIVE
Ketones, UA: NEGATIVE
Leukocytes,UA: NEGATIVE
Nitrite, UA: NEGATIVE
Protein,UA: NEGATIVE
RBC, UA: NEGATIVE
Specific Gravity, UA: 1.015 (ref 1.005–1.030)
Urobilinogen, Ur: 0.2 mg/dL (ref 0.2–1.0)
pH, UA: 7 (ref 5.0–7.5)

## 2020-05-27 LAB — BLADDER SCAN AMB NON-IMAGING: Scan Result: 140

## 2020-05-27 MED ORDER — TAMSULOSIN HCL 0.4 MG PO CAPS
0.4000 mg | ORAL_CAPSULE | Freq: Two times a day (BID) | ORAL | 11 refills | Status: DC
Start: 2020-05-27 — End: 2021-01-13

## 2020-05-27 NOTE — Progress Notes (Signed)
05/27/2020 10:02 AM   Jeffrey Gordon September 05, 1955 [id number]  Referring provider: Celene Squibb, MD [address],  [place] [address]  Followup BPH  HPI: [name] is a 64yo here for followup for BPH and urinary retention. He was on floamx 0.4mg  BID but then decreased to daily 1 week ago. Nocturia 2x. PVR 140cc. Strong stream. No urgency or frequency. He did note that he has less urgency on flomax 0.4mg  daily. No dysuria or hematuria   PMH: Past Medical History:  Diagnosis Date  . Acid reflux   . Allergy   . Anxiety   . Asthma   . Blurred vision, left eye   . Cataract   . COPD (chronic obstructive pulmonary disease) ([place])   . Depression   . GERD (gastroesophageal reflux disease)   . High cholesterol   . Hypertension   . Hypothyroidism   . Hypothyroidism     Surgical History: Past Surgical History:  Procedure Laterality Date  . CATARACT EXTRACTION W/ INTRAOCULAR LENS  IMPLANT, BILATERAL     2002 (left); 2012 (right)  . COLONOSCOPY    . EYE SURGERY    . [place]   left  . INGUINAL HERNIA REPAIR Right 04/10/2020   Procedure: HERNIA REPAIR INGUINAL ADULT;  Surgeon: Virl Cagey, MD;  Location: [facility];  Service: General;  Laterality: Right;  . PARS PLANA VITRECTOMY  10/20/11   "took out the old lens implant and put a new one in"  . PARS PLANA VITRECTOMY  10/20/2011   Procedure: PARS PLANA VITRECTOMY WITH 25G REMOVAL/SUTURE INTRAOCULAR LENS;  Surgeon: Hayden Pedro, MD;  Location: [facility];  Service: Ophthalmology;  Laterality: Left;  . RETINAL DETACHMENT SURGERY  2002   left  . RETINAL DETACHMENT SURGERY  2003   "repair; left"    Home Medications:  Allergies as of 05/27/2020      Reactions   Durezol [difluprednate] Other (See Comments)   "Pressure increased in my eye"   Advair Diskus [fluticasone-salmeterol] Other (See Comments)   Issues sleeping      Medication List       Accurate as of May 27, 2020 10:02 AM. If you  have any questions, ask your nurse or doctor.        acetaminophen 325 MG tablet Commonly known as: TYLENOL Take 2 tablets (650 mg total) by mouth every 6 (six) hours as needed for mild pain (or Fever >/= 101).   aspirin EC 81 MG tablet Take 1 tablet (81 mg total) by mouth daily with breakfast.   chlorproMAZINE 10 MG tablet Commonly known as: THORAZINE Take 1 tablet (10 mg total) by mouth 2 (two) times daily as needed.   CO ENZYME Q-10 PO Take 1 capsule by mouth daily.   Combigan 0.2-0.5 % ophthalmic solution Generic drug: brimonidine-timolol Place 1 drop into the right eye in the morning and at bedtime.   diazepam 2 MG tablet Commonly known as: Valium Take 1 tablet (2 mg total) by mouth every 6 (six) hours as needed for anxiety.   FLUoxetine 40 MG capsule Commonly known as: PROZAC Take 40 mg by mouth daily.   latanoprost 0.005 % ophthalmic solution Commonly known as: XALATAN Place 1 drop into both eyes at bedtime.   levothyroxine 88 MCG tablet Commonly known as: SYNTHROID Take 88 mcg by mouth daily before breakfast.   Melatonin 3 MG Caps Take by mouth.   omeprazole 20 MG capsule Commonly known as:  PRILOSEC Take 20 mg by mouth daily.   polyethylene glycol 17 g packet Commonly known as: MIRALAX / GLYCOLAX Take 17 g by mouth daily.   PRESERVISION AREDS 2 PO Take 1 capsule by mouth in the morning and at bedtime.   ProAir HFA 108 (90 Base) MCG/ACT inhaler Generic drug: albuterol Inhale 1-2 puffs into the lungs every 6 (six) hours as needed.   rosuvastatin 10 MG tablet Commonly known as: CRESTOR Take 10 mg by mouth daily.   senna-docusate 8.6-50 MG tablet Commonly known as: Senokot-S Take 2 tablets by mouth at bedtime.   sildenafil 100 MG tablet Commonly known as: VIAGRA Take 100 mg by mouth daily as needed.   Symbicort 160-4.5 MCG/ACT inhaler Generic drug: budesonide-formoterol INHALE 2 PUFFS INTO THE LUNGS TWICE DAILY What changed: when to take  this   tamsulosin 0.4 MG Caps capsule Commonly known as: FLOMAX Take 1 capsule (0.4 mg total) by mouth 2 (two) times daily.       Allergies:  Allergies  Allergen Reactions  . Durezol [Difluprednate] Other (See Comments)    "Pressure increased in my eye"  . Advair Diskus [Fluticasone-Salmeterol] Other (See Comments)    Issues sleeping    Family History: Family History  Problem Relation Age of Onset  . Colon cancer Father   . Esophageal cancer Neg Hx   . Rectal cancer Neg Hx   . Stomach cancer Neg Hx     Social History:  reports that he has never smoked. He has never used smokeless tobacco. He reports previous alcohol use. He reports that he does not use drugs.  ROS: All other review of systems were reviewed and are negative except what is noted above in HPI  Physical Exam: BP (!) 151/91   Pulse 75   Temp 98.2 F (36.8 C)   Ht 6' 3.5" (1.918 m)   Wt 228 lb (103.4 kg)   BMI 28.12 kg/m   Constitutional:  Alert and oriented, No acute distress. HEENT: [place] AT, moist mucus membranes.  Trachea midline, no masses. Cardiovascular: No clubbing, cyanosis, or edema. Respiratory: Normal respiratory effort, no increased work of breathing. GI: Abdomen is soft, nontender, nondistended, no abdominal masses GU: No CVA tenderness.  Lymph: No cervical or inguinal lymphadenopathy. Skin: No rashes, bruises or suspicious lesions. Neurologic: Grossly intact, no focal deficits, moving all 4 extremities. Psychiatric: Normal mood and affect.  Laboratory Data: Lab Results  Component Value Date   WBC 11.7 (H) 04/16/2020   HGB 8.8 (L) 04/16/2020   HCT 26.2 (L) 04/16/2020   MCV 89.4 04/16/2020   PLT 313 04/16/2020    Lab Results  Component Value Date   CREATININE 0.63 04/17/2020    Lab Results  Component Value Date   PSA Dr. Oswaldo Milian follows 04/24/2008   PSA normal (0.58) 10/28/2006    No results found for: TESTOSTERONE  No results found for: HGBA1C  Urinalysis    Component  Value Date/Time   COLORURINE YELLOW 04/13/2020 1120   APPEARANCEUR CLEAR 04/13/2020 1120   LABSPEC 1.024 04/13/2020 1120   PHURINE 6.0 04/13/2020 1120   GLUCOSEU NEGATIVE 04/13/2020 1120   HGBUR NEGATIVE 04/13/2020 1120   HGBUR negative 04/24/2008 0820   BILIRUBINUR negative 04/20/2020 1836   KETONESUR negative 04/20/2020 1836   KETONESUR 20 (A) 04/13/2020 1120   PROTEINUR =30 (A) 04/20/2020 1836   PROTEINUR 30 (A) 04/13/2020 1120   UROBILINOGEN 4.0 (A) 04/20/2020 1836   UROBILINOGEN 0.2 04/24/2008 0820   NITRITE Positive (A) 04/20/2020 1836  NITRITE NEGATIVE 04/13/2020 1120   LEUKOCYTESUR Large (3+) (A) 04/20/2020 1836   LEUKOCYTESUR NEGATIVE 04/13/2020 1120    Lab Results  Component Value Date   BACTERIA NONE SEEN 04/13/2020    Pertinent Imaging:  No results found for this or any previous visit.  No results found for this or any previous visit.  No results found for this or any previous visit.  No results found for this or any previous visit.  No results found for this or any previous visit.  No results found for this or any previous visit.  No results found for this or any previous visit.  No results found for this or any previous visit.   Assessment & Plan:    1. Urinary retention -increase flomax to BID - BLADDER SCAN AMB NON-IMAGING - Urinalysis, Routine w reflex microscopic  2. BPH with LUTS -Increase flomax to BID   No follow-ups on file.  Nicolette Bang, MD  [facility] [place]

## 2020-05-27 NOTE — Patient Instructions (Signed)

## 2020-05-27 NOTE — Progress Notes (Signed)
Urological Symptom Review  Patient is experiencing the following symptoms: Frequent urination Erection problems (male only)   Review of Systems  Gastrointestinal (upper)  : Negative for upper GI symptoms  Gastrointestinal (lower) : Negative for lower GI symptoms  Constitutional : Weight loss  Skin: Negative for skin symptoms  Eyes: Negative for eye symptoms  Ear/Nose/Throat : Negative for Ear/Nose/Throat symptoms  Hematologic/Lymphatic: Negative for Hematologic/Lymphatic symptoms  Cardiovascular : Negative for cardiovascular symptoms  Respiratory : Negative for respiratory symptoms  Endocrine: Negative for endocrine symptoms  Musculoskeletal: Negative for musculoskeletal symptoms  Neurological: Negative for neurological symptoms  Psychologic: Negative for psychiatric symptoms

## 2020-06-15 DIAGNOSIS — I10 Essential (primary) hypertension: Secondary | ICD-10-CM | POA: Diagnosis not present

## 2020-06-25 ENCOUNTER — Ambulatory Visit
Admission: EM | Admit: 2020-06-25 | Discharge: 2020-06-25 | Disposition: A | Discharge status: Home / Self Care | Payer: Medicare Other | Attending: Emergency Medicine | Admitting: Emergency Medicine

## 2020-06-25 ENCOUNTER — Other Ambulatory Visit: Payer: Self-pay

## 2020-06-25 ENCOUNTER — Encounter: Payer: Self-pay | Admitting: Emergency Medicine

## 2020-06-25 DIAGNOSIS — K580 Irritable bowel syndrome with diarrhea: Secondary | ICD-10-CM

## 2020-06-25 MED ORDER — DICYCLOMINE HCL 20 MG PO TABS: 20.0000 mg | ORAL_TABLET | Freq: Two times a day (BID) | ORAL | 0 refills | Status: DC

## 2020-06-25 NOTE — ED Provider Notes (Signed)
[place]   [id number] 06/25/20 Arrival Time: [facility]   Chief Complaint  Patient presents with  . Diarrhea     SUBJECTIVE: History from: patient.  Jeffrey Gordon is a 64 y.o. male with history of IBS presented to the urgent care for complaint of loose stool for the past 4 days.  Denies a precipitating event, or recent antibiotic use.  Has tried OTC medications without relief.  Symptoms are made worse with eating.  Reports similar symptoms in the past.  Denies fever, chills, appetite change, weight change, chest pain, nausea, vomiting, changes in bowel or bladder habits.  ROS: As per HPI.  All other pertinent ROS negative.     Past Medical History:  Diagnosis Date  . Acid reflux   . Allergy   . Anxiety   . Asthma   . Blurred vision, left eye   . Cataract   . COPD (chronic obstructive pulmonary disease) ([place])   . Depression   . GERD (gastroesophageal reflux disease)   . High cholesterol   . Hypertension   . Hypothyroidism   . Hypothyroidism    Past Surgical History:  Procedure Laterality Date  . CATARACT EXTRACTION W/ INTRAOCULAR LENS  IMPLANT, BILATERAL     2002 (left); 2012 (right)  . COLONOSCOPY    . EYE SURGERY    . [place]   left  . INGUINAL HERNIA REPAIR Right 04/10/2020   Procedure: HERNIA REPAIR INGUINAL ADULT;  Surgeon: Virl Cagey, MD;  Location: [facility];  Service: General;  Laterality: Right;  . PARS PLANA VITRECTOMY  10/20/11   "took out the old lens implant and put a new one in"  . PARS PLANA VITRECTOMY  10/20/2011   Procedure: PARS PLANA VITRECTOMY WITH 25G REMOVAL/SUTURE INTRAOCULAR LENS;  Surgeon: Hayden Pedro, MD;  Location: [facility];  Service: Ophthalmology;  Laterality: Left;  . RETINAL DETACHMENT SURGERY  2002   left  . RETINAL DETACHMENT SURGERY  2003   "repair; left"   Allergies  Allergen Reactions  . Durezol [Difluprednate] Other (See Comments)    "Pressure increased in my eye"  . Advair Diskus  [Fluticasone-Salmeterol] Other (See Comments)    Issues sleeping   No current facility-administered medications on file prior to encounter.   Current Outpatient Medications on File Prior to Encounter  Medication Sig Dispense Refill  . acetaminophen (TYLENOL) 325 MG tablet Take 2 tablets (650 mg total) by mouth every 6 (six) hours as needed for mild pain (or Fever >/= 101). 12 tablet 2  . aspirin EC 81 MG tablet Take 1 tablet (81 mg total) by mouth daily with breakfast. 30 tablet 11  . brimonidine-timolol (COMBIGAN) 0.2-0.5 % ophthalmic solution Place 1 drop into the right eye in the morning and at bedtime.     . chlorproMAZINE (THORAZINE) 10 MG tablet Take 1 tablet (10 mg total) by mouth 2 (two) times daily as needed. 30 tablet 0  . CO ENZYME Q-10 PO Take 1 capsule by mouth daily.     . diazepam (VALIUM) 2 MG tablet Take 1 tablet (2 mg total) by mouth every 6 (six) hours as needed for anxiety. 20 tablet 0  . FLUoxetine (PROZAC) 40 MG capsule Take 40 mg by mouth daily.    Marland Kitchen latanoprost (XALATAN) 0.005 % ophthalmic solution Place 1 drop into both eyes at bedtime.     Marland Kitchen levothyroxine (SYNTHROID, LEVOTHROID) 88 MCG tablet Take 88 mcg by mouth daily before breakfast.     .  Melatonin 3 MG CAPS Take by mouth.    . Multiple Vitamins-Minerals (PRESERVISION AREDS 2 PO) Take 1 capsule by mouth in the morning and at bedtime.    Marland Kitchen omeprazole (PRILOSEC) 20 MG capsule Take 20 mg by mouth daily.    . polyethylene glycol (MIRALAX / GLYCOLAX) 17 g packet Take 17 g by mouth daily. 30 each 2  . PROAIR HFA 108 (90 Base) MCG/ACT inhaler Inhale 1-2 puffs into the lungs every 6 (six) hours as needed.     . rosuvastatin (CRESTOR) 10 MG tablet Take 10 mg by mouth daily.    Marland Kitchen senna-docusate (SENOKOT-S) 8.6-50 MG tablet Take 2 tablets by mouth at bedtime. 60 tablet 2  . sildenafil (VIAGRA) 100 MG tablet Take 100 mg by mouth daily as needed.     . SYMBICORT 160-4.5 MCG/ACT inhaler INHALE 2 PUFFS INTO THE LUNGS TWICE  DAILY (Patient taking differently: Inhale 2 puffs into the lungs in the morning and at bedtime. ) 10.2 g 5  . tamsulosin (FLOMAX) 0.4 MG CAPS capsule Take 1 capsule (0.4 mg total) by mouth 2 (two) times daily. 60 capsule 11   Social History   Socioeconomic History  . Marital status: Married    Spouse name: Not on file  . Number of children: 2  . Years of education: Not on file  . Highest education level: Not on file  Occupational History  . Occupation: retired  Tobacco Use  . Smoking status: Never Smoker  . Smokeless tobacco: Never Used  . Tobacco comment: "tried smoking briefly as a teen"  Substance and Sexual Activity  . Alcohol use: Not Currently    Comment: 10/20/11 "last alcohol ` 2010"  . Drug use: No  . Sexual activity: Yes  Other Topics Concern  . Not on file  Social History Narrative  . Not on file   Social Determinants of Health   Financial Resource Strain:   . Difficulty of Paying Living Expenses: Not on file  Food Insecurity:   . Worried About Charity fundraiser in the Last Year: Not on file  . Ran Out of Food in the Last Year: Not on file  Transportation Needs:   . Lack of Transportation (Medical): Not on file  . Lack of Transportation (Non-Medical): Not on file  Physical Activity:   . Days of Exercise per Week: Not on file  . Minutes of Exercise per Session: Not on file  Stress:   . Feeling of Stress : Not on file  Social Connections:   . Frequency of Communication with Friends and Family: Not on file  . Frequency of Social Gatherings with Friends and Family: Not on file  . Attends Religious Services: Not on file  . Active Member of Clubs or Organizations: Not on file  . Attends Archivist Meetings: Not on file  . Marital Status: Not on file  Intimate Partner Violence:   . Fear of Current or Ex-Partner: Not on file  . Emotionally Abused: Not on file  . Physically Abused: Not on file  . Sexually Abused: Not on file   Family History    Problem Relation Age of Onset  . Colon cancer Father   . Esophageal cancer Neg Hx   . Rectal cancer Neg Hx   . Stomach cancer Neg Hx     OBJECTIVE:  Vitals:   06/25/20 0900 06/25/20 0902  BP: 132/86   Pulse: 97   Resp: 19   Temp: 97.6 F (36.4 C)  TempSrc: Oral   SpO2: 98%   Weight:  224 lb (101.6 kg)  Height:  6\' 3"  (1.905 m)     Physical Exam Vitals and nursing note reviewed.  Constitutional:      General: He is not in acute distress.    Appearance: Normal appearance. He is normal weight. He is not ill-appearing, toxic-appearing or diaphoretic.  Cardiovascular:     Rate and Rhythm: Normal rate and regular rhythm.     Pulses: Normal pulses.     Heart sounds: Normal heart sounds. No murmur heard.  No friction rub. No gallop.   Pulmonary:     Effort: Pulmonary effort is normal. No respiratory distress.     Breath sounds: Normal breath sounds. No stridor. No wheezing, rhonchi or rales.  Chest:     Chest wall: No tenderness.  Abdominal:     General: Abdomen is flat. Bowel sounds are normal. There is no distension.     Palpations: Abdomen is soft. There is no mass.     Tenderness: There is no abdominal tenderness. There is no right CVA tenderness, left CVA tenderness, guarding or rebound. Negative signs include McBurney's sign.     Hernia: No hernia is present.  Neurological:     Mental Status: He is alert and oriented to person, place, and time.     LABS:  No results found for this or any previous visit (from the past 24 hour(s)).   ASSESSMENT & PLAN:  1. Irritable bowel syndrome with diarrhea     Meds ordered this encounter  Medications  . dicyclomine (BENTYL) 20 MG tablet    Sig: Take 1 tablet (20 mg total) by mouth 2 (two) times daily.    Dispense:  20 tablet    Refill:  0    Discharge instructions  Bentyl was prescribed/take as directed May use OTC Imodium as needed Increase fluid intake to replace fluid loss Follow-up with PCP/GI as  needed Return or go to ED if you develop any new or worsening of symptoms  Reviewed expectations re: course of current medical issues. Questions answered. Outlined signs and symptoms indicating need for more acute intervention. Patient verbalized understanding. After Visit Summary given.         Emerson Monte, [place] 06/25/20 [phone]

## 2020-06-25 NOTE — Discharge Instructions (Addendum)
Bentyl was prescribed/take as directed May use OTC Imodium as needed Increase fluid intake to replace fluid loss Follow-up with PCP/GI as needed Return or go to ED if you develop any new or worsening of symptoms

## 2020-06-25 NOTE — ED Triage Notes (Signed)
Lose stools since Sunday. Took some otc anti diarrhea med on Sunday and Monday but it was 65 years old per pt.

## 2020-07-02 DIAGNOSIS — R197 Diarrhea, unspecified: Secondary | ICD-10-CM | POA: Diagnosis not present

## 2020-07-14 ENCOUNTER — Encounter: Payer: Self-pay | Admitting: General Surgery

## 2020-07-14 ENCOUNTER — Ambulatory Visit (INDEPENDENT_AMBULATORY_CARE_PROVIDER_SITE_OTHER): Payer: Medicare Other | Admitting: General Surgery

## 2020-07-14 ENCOUNTER — Other Ambulatory Visit: Payer: Self-pay

## 2020-07-14 VITALS — BP 130/85 | HR 72 | Temp 97.8°F | Resp 16 | Ht 75.5 in | Wt 227.0 lb

## 2020-07-14 DIAGNOSIS — K4090 Unilateral inguinal hernia, without obstruction or gangrene, not specified as recurrent: Secondary | ICD-10-CM | POA: Diagnosis not present

## 2020-07-14 NOTE — Patient Instructions (Signed)
Overall improved. Call if concern or change.

## 2020-07-14 NOTE — Progress Notes (Signed)
[facility]   HPI:  64 y.o. Male presents to clinic for follow-up evaluation of his right inguinal hernia s/p hematoma post op. He is doing much better and says the swelling is greatly resolved. He still has a hard area in the right groin but this has not gotten bigger and is only getting smaller. He had a large hematoma into the scrotum post op. He is back to regular activities and denies any groin pain.  Review of Systems:  No drainage No redness No pain Continued improved swelling  All other review of systems: otherwise negative   Vital Signs:  BP 130/85   Pulse 72   Temp 97.8 F (36.6 C) (Oral)   Resp 16   Ht 6' 3.5" (1.918 m)   Wt 227 lb (103 kg)   SpO2 98%   BMI 28.00 kg/m    Physical Exam:  Physical Exam Vitals reviewed.  Cardiovascular:     Rate and Rhythm: Normal rate.  Pulmonary:     Effort: Pulmonary effort is normal.  Abdominal:     General: There is no distension.     Palpations: Abdomen is soft.     Tenderness: There is no abdominal tenderness.     Hernia: No hernia is present.     Comments: Right inguinal incision healed, scrotum without swelling or hematoma, pinpong size firmness that is round and non mobile consistent with resolving hematoma, non reducible, nontender   Neurological:     Mental Status: He is alert.       Assessment:  64 y.o. yo Male with resolving hematoma after post operative bleeding for right inguinal hernia repair. He is healing and feeling fine. He does have a residual hematoma but it is down significantly since 2 months ago. He denies any increase in size or bulge that would make me suspect recurrence but this is always a possibility.  Plan:  - Follow up in 2 months to see if resolved or if need to repeat CT to assess further  - Call with concerns    Future Appointments  Date Time Provider [place]  08/20/2020 10:40 AM Irene Shipper, MD [facility]  08/26/2020 11:30 AM McKenzie, Candee Furbish, MD [facility] None  09/15/2020 10:00 AM Virl Cagey, MD [facility] None    Curlene Labrum, MD [facility] [address] [place], [place] [address] [phone] (office)

## 2020-07-16 ENCOUNTER — Ambulatory Visit: Payer: Medicare Other | Admitting: General Surgery

## 2020-08-06 ENCOUNTER — Ambulatory Visit: Payer: Medicare Other | Attending: Internal Medicine

## 2020-08-06 DIAGNOSIS — Z23 Encounter for immunization: Secondary | ICD-10-CM

## 2020-08-06 NOTE — Progress Notes (Signed)
   Covid-19 Vaccination Clinic  Name:  Jeffrey Gordon    MRN: [id number] DOB: [birth year]  08/06/2020  [name] was observed post Covid-19 immunization for 15 minutes without incident. He was provided with Vaccine Information Sheet and instruction to access the V-Safe system.   [name] was instructed to call 911 with any severe reactions post vaccine: Marland Kitchen Difficulty breathing  . Swelling of face and throat  . A fast heartbeat  . A bad rash all over body  . Dizziness and weakness   Immunizations Administered    Name Date Dose VIS Date Route   Pfizer COVID-19 Vaccine 08/06/2020  4:01 PM 0.3 mL 06/03/2020 Intramuscular   Manufacturer: [place]   Lot: [id number]   NDC: [address]-1

## 2020-08-20 ENCOUNTER — Ambulatory Visit: Payer: Medicare Other | Admitting: Internal Medicine

## 2020-08-20 ENCOUNTER — Encounter: Payer: Self-pay | Admitting: Internal Medicine

## 2020-08-20 VITALS — BP 130/82 | HR 76 | Ht 73.5 in | Wt 225.2 lb

## 2020-08-20 DIAGNOSIS — K219 Gastro-esophageal reflux disease without esophagitis: Secondary | ICD-10-CM

## 2020-08-20 DIAGNOSIS — R197 Diarrhea, unspecified: Secondary | ICD-10-CM

## 2020-08-20 DIAGNOSIS — Z86010 Personal history of colonic polyps: Secondary | ICD-10-CM | POA: Diagnosis not present

## 2020-08-20 NOTE — Progress Notes (Signed)
HISTORY OF PRESENT ILLNESS:  Jeffrey Gordon is a 65 y.o. male with past medical history as listed below who has been seen in this office for colon cancer screening and colon cancer surveillance.  He presents today regarding recent problems with diarrhea.  The patient underwent inguinal hernia repair August 2021.  This was complicated by right-sided hematoma.  He was having problems with constipation at that time for which he was treated with MiraLAX.  His bowels are regulated and MiraLAX discontinued.  In November he developed problems with diarrhea.  Previously he reported his normal bowel habit pattern to be 1 bowel movement every 1 to 2 days.  This was formed.  Thereafter he was having multiple stools.  This has improved since.  Currently he reports 1 bowel movement per day which is a bit looser than his normal, but not problematic.  He was concerned that this may represent something sinister and requested evaluation.  It should be noted that he has had a prior colonoscopy in 2003, 2008, 2014, and most recently June 2019 when he was found to have 2 diminutive polyps and sigmoid diverticulosis.  Follow-up in 5 years recommended.  There is a family history of colon cancer in his father.  Patient denies abdominal pain or bleeding.  He did have some weight loss after his surgery.  He does have GERD for which he takes omeprazole.  This controls symptoms.  He has completed his COVID vaccination series and booster  REVIEW OF SYSTEMS:  All non-GI ROS negative unless otherwise stated in the HPI except for visual change, sleeping problems, anxiety  Past Medical History:  Diagnosis Date  . Acid reflux   . Allergy   . Anxiety   . Asthma   . Blurred vision, left eye   . Cataract   . COPD (chronic obstructive pulmonary disease) ([place])   . Depression   . GERD (gastroesophageal reflux disease)   . High cholesterol   . Hypertension   . Hypothyroidism   . Hypothyroidism     Past Surgical History:   Procedure Laterality Date  . CATARACT EXTRACTION W/ INTRAOCULAR LENS  IMPLANT, BILATERAL     2002 (left); 2012 (right)  . COLONOSCOPY    . EYE SURGERY    . [place]   left  . INGUINAL HERNIA REPAIR Right 04/10/2020   Procedure: HERNIA REPAIR INGUINAL ADULT;  Surgeon: Virl Cagey, MD;  Location: [facility];  Service: General;  Laterality: Right;  . PARS PLANA VITRECTOMY  10/20/11   "took out the old lens implant and put a new one in"  . PARS PLANA VITRECTOMY  10/20/2011   Procedure: PARS PLANA VITRECTOMY WITH 25G REMOVAL/SUTURE INTRAOCULAR LENS;  Surgeon: Hayden Pedro, MD;  Location: [facility];  Service: Ophthalmology;  Laterality: Left;  . RETINAL DETACHMENT SURGERY  2002   left  . RETINAL DETACHMENT SURGERY  2003   "repair; left"    Social History Jeffrey Gordon  reports that he has never smoked. He has never used smokeless tobacco. He reports previous alcohol use. He reports that he does not use drugs.  family history includes Colon cancer in his father and paternal aunt.  Allergies  Allergen Reactions  . Durezol [Difluprednate] Other (See Comments)    "Pressure increased in my eye"  . Advair Diskus [Fluticasone-Salmeterol] Other (See Comments)    Issues sleeping       PHYSICAL EXAMINATION: Vital signs: BP 130/82 (BP Location: Left Arm, Patient Position:  Sitting, Cuff Size: Normal)   Pulse 76   Ht 6' 1.5" (1.867 m) Comment: height measured without shoes  Wt 225 lb 4 oz (102.2 kg)   BMI 29.32 kg/m   Constitutional: generally well-appearing, no acute distress Psychiatric: alert and oriented x3, cooperative Eyes: extraocular movements intact, anicteric, conjunctiva pink Mouth: oral pharynx moist, no lesions Neck: supple no lymphadenopathy Cardiovascular: heart regular rate and rhythm, no murmur Lungs: clear to auscultation bilaterally Abdomen: soft, nontender, nondistended, no obvious ascites, no peritoneal signs, normal bowel sounds, no  organomegaly Rectal: Omitted Extremities: no clubbing, cyanosis, or lower extremity edema bilaterally Skin: no lesions on visible extremities Neuro: No focal deficits.  Cranial nerves intact  ASSESSMENT:  1.  Transient problems with diarrhea.  Most likely self-limited acute infectious process. 2.  History of adenomatous colon polyps and family history of colon cancer.  Multiple previous colonoscopies as described 3.  GERD.  Controlled with PPI   PLAN:  1.  Discussion on acute self-limited diarrhea. 2.  Advised to add bulk fiber to improve stool consistency 3.  Surveillance colonoscopy around 2024 4.  Reflux precautions 5.  Continue PPI.  Lowest dose to control symptoms 6.  Resume general medical care with PCP 7.  Interval GI follow-up as needed

## 2020-08-20 NOTE — Patient Instructions (Signed)
Follow up as needed

## 2020-08-24 DIAGNOSIS — J00 Acute nasopharyngitis [common cold]: Secondary | ICD-10-CM | POA: Diagnosis not present

## 2020-08-26 ENCOUNTER — Ambulatory Visit: Payer: Medicare Other | Admitting: Urology

## 2020-09-02 DIAGNOSIS — R7303 Prediabetes: Secondary | ICD-10-CM | POA: Diagnosis not present

## 2020-09-02 DIAGNOSIS — Z136 Encounter for screening for cardiovascular disorders: Secondary | ICD-10-CM | POA: Diagnosis not present

## 2020-09-02 DIAGNOSIS — E871 Hypo-osmolality and hyponatremia: Secondary | ICD-10-CM | POA: Diagnosis not present

## 2020-09-07 DIAGNOSIS — J449 Chronic obstructive pulmonary disease, unspecified: Secondary | ICD-10-CM | POA: Diagnosis not present

## 2020-09-07 DIAGNOSIS — E039 Hypothyroidism, unspecified: Secondary | ICD-10-CM | POA: Diagnosis not present

## 2020-09-07 DIAGNOSIS — R7303 Prediabetes: Secondary | ICD-10-CM | POA: Diagnosis not present

## 2020-09-07 DIAGNOSIS — K219 Gastro-esophageal reflux disease without esophagitis: Secondary | ICD-10-CM | POA: Diagnosis not present

## 2020-09-07 DIAGNOSIS — I10 Essential (primary) hypertension: Secondary | ICD-10-CM | POA: Diagnosis not present

## 2020-09-07 DIAGNOSIS — E782 Mixed hyperlipidemia: Secondary | ICD-10-CM | POA: Diagnosis not present

## 2020-09-07 DIAGNOSIS — R251 Tremor, unspecified: Secondary | ICD-10-CM | POA: Diagnosis not present

## 2020-09-07 DIAGNOSIS — K4091 Unilateral inguinal hernia, without obstruction or gangrene, recurrent: Secondary | ICD-10-CM | POA: Diagnosis not present

## 2020-09-11 ENCOUNTER — Telehealth: Payer: Self-pay | Admitting: Pulmonary Disease

## 2020-09-11 MED ORDER — BUDESONIDE-FORMOTEROL FUMARATE 160-4.5 MCG/ACT IN AERO
2.0000 | INHALATION_SPRAY | Freq: Two times a day (BID) | RESPIRATORY_TRACT | 1 refills | Status: DC
Start: 2020-09-11 — End: 2020-11-11

## 2020-09-11 NOTE — Telephone Encounter (Signed)
Called spoke with patient.  Let him know he would have to ask for more refills at his OV with Dr. Vaughan Browner in March.  Verified pharmacy  Voiced understanding.  Nothing further needed at this time.

## 2020-09-14 ENCOUNTER — Other Ambulatory Visit: Payer: Self-pay | Admitting: Pulmonary Disease

## 2020-09-15 ENCOUNTER — Ambulatory Visit: Payer: Medicare Other | Admitting: General Surgery

## 2020-09-24 DIAGNOSIS — H4061X1 Glaucoma secondary to drugs, right eye, mild stage: Secondary | ICD-10-CM | POA: Diagnosis not present

## 2020-10-06 ENCOUNTER — Ambulatory Visit (INDEPENDENT_AMBULATORY_CARE_PROVIDER_SITE_OTHER): Payer: Medicare Other | Admitting: General Surgery

## 2020-10-06 ENCOUNTER — Other Ambulatory Visit: Payer: Self-pay

## 2020-10-06 ENCOUNTER — Encounter: Payer: Self-pay | Admitting: General Surgery

## 2020-10-06 VITALS — BP 129/84 | HR 75 | Temp 97.9°F | Resp 14 | Ht 75.5 in | Wt 231.0 lb

## 2020-10-06 DIAGNOSIS — Z8719 Personal history of other diseases of the digestive system: Secondary | ICD-10-CM | POA: Diagnosis not present

## 2020-10-06 DIAGNOSIS — Z9889 Other specified postprocedural states: Secondary | ICD-10-CM

## 2020-10-06 NOTE — Patient Instructions (Signed)
Call with issues. Activity as tolerated.

## 2020-10-06 NOTE — Progress Notes (Signed)
[facility]   HPI:  65 y.o. Male presents to clinic for follow-up evaluation of his right inguinal region after repair in August and subsequent large hematoma formation. He has improved greatly since that time. He says the swelling is gone but he does notice some hardness. He denies any pain or obstructive symptoms.   Review of Systems:  No pain or bulge Some hardness/ ridge in area  All other review of systems: otherwise negative   Vital Signs:  BP 129/84   Pulse 75   Temp 97.9 F (36.6 C) (Other (Comment))   Resp 14   Ht 6' 3.5" (1.918 m)   Wt 231 lb (104.8 kg)   SpO2 95%   BMI 28.49 kg/m    Physical Exam:  Physical Exam Vitals reviewed.  Cardiovascular:     Rate and Rhythm: Normal rate.  Pulmonary:     Effort: Pulmonary effort is normal.  Genitourinary:    Comments: Right inguinal incision healed, ridge of scarring/ induration present, decreased in size, no swelling in subcutaneous area, no recurrence, no swelling into scrotum  Neurological:     Mental Status: He is alert.      Assessment:  65 y.o. yo Male s/p right inguinal hernia repair with plug and patch that had a complication of a large hematoma that has taken 6 months to reabsorb but appears pretty much resolved. He has some induration from the mesh and scar still but I anticipate this will continue to soften as he continues to heal. He has no discomfort of size of recurrence.   Plan:  Call with issues. Activity as tolerated. PRN follow up    Curlene Labrum, MD [facility] [address] [place], [place] [address] [phone] (office)

## 2020-10-07 ENCOUNTER — Ambulatory Visit: Payer: Medicare Other | Admitting: Urology

## 2020-10-21 ENCOUNTER — Other Ambulatory Visit: Payer: Self-pay

## 2020-10-21 ENCOUNTER — Ambulatory Visit: Payer: Medicare Other | Admitting: Pulmonary Disease

## 2020-10-21 ENCOUNTER — Encounter: Payer: Self-pay | Admitting: Pulmonary Disease

## 2020-10-21 VITALS — BP 126/80 | HR 79 | Temp 97.8°F | Ht 74.0 in | Wt 232.6 lb

## 2020-10-21 DIAGNOSIS — J4540 Moderate persistent asthma, uncomplicated: Secondary | ICD-10-CM

## 2020-10-21 NOTE — Progress Notes (Signed)
Jeffrey Gordon    [id number]    Apr 28, 1956  Primary Care Physician:Hall, Edwinna Areola, MD  Referring Physician: Celene Squibb, MD [address] [place],  [place] [address]  Chief complaint: Follow-up for Asthma  HPI: 65 year old with past medical history of GERD, hypothyroidism, hyperlipidemia Evaluated in 2019 for dyspnea, hyperinflation and obstructive airway disease secondary to asthma, possible COPD. 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  No other significant lung issues.  No family history of lung problems except for pulmonary embolism in his grandfather after immobilization secondary to surgery.  Pets: Cat, no birds, farm animals  Occupation: Retired Optometrist Exposures: No known exposures, no mold, hot tub Smoking history: Smoked for a year high school Travel History: Lived in [place], [place], [place], [place] and [place]  Interim history: S/p  hernia repair in August 2021.  Hospitalized in September 2021 for hyponatremia and blood loss anemia. Overall he feels improved from this episode  Breathing is stable, continues on Symbicort  Outpatient Encounter Medications as of 10/21/2020  Medication Sig  . aspirin EC 81 MG tablet Take 1 tablet (81 mg total) by mouth daily with breakfast.  . brimonidine-timolol (COMBIGAN) 0.2-0.5 % ophthalmic solution Place 1 drop into the right eye in the morning and at bedtime.   . budesonide-formoterol (SYMBICORT) 160-4.5 MCG/ACT inhaler Inhale 2 puffs into the lungs 2 (two) times daily.  . CO ENZYME Q-10 PO Take 1 capsule by mouth daily.   . diazepam (VALIUM) 2 MG tablet Take 1 tablet (2 mg total) by mouth every 6 (six) hours as needed for anxiety.  Marland Kitchen FLUoxetine (PROZAC) 40 MG capsule Take 40 mg by mouth daily.  Marland Kitchen latanoprost (XALATAN) 0.005 % ophthalmic solution Place 1 drop into both eyes at bedtime.   Marland Kitchen levothyroxine (SYNTHROID, LEVOTHROID) 88 MCG  tablet Take 88 mcg by mouth daily before breakfast.   . losartan (COZAAR) 100 MG tablet Take 50 mg by mouth daily.  . Melatonin 3 MG CAPS Take by mouth.  . Multiple Vitamins-Minerals (PRESERVISION AREDS 2 PO) Take 1 capsule by mouth in the morning and at bedtime.  Marland Kitchen omeprazole (PRILOSEC) 20 MG capsule Take 20 mg by mouth daily.  . rosuvastatin (CRESTOR) 10 MG tablet Take 10 mg by mouth daily.  . sildenafil (VIAGRA) 100 MG tablet Take 100 mg by mouth daily as needed.   . tamsulosin (FLOMAX) 0.4 MG CAPS capsule Take 1 capsule (0.4 mg total) by mouth 2 (two) times daily.  . chlorproMAZINE (THORAZINE) 10 MG tablet Take 1 tablet (10 mg total) by mouth 2 (two) times daily as needed. (Patient not taking: Reported on 10/21/2020)  . dicyclomine (BENTYL) 20 MG tablet Take 1 tablet (20 mg total) by mouth 2 (two) times daily. (Patient not taking: Reported on 10/21/2020)   No facility-administered encounter medications on file as of 10/21/2020.   Physical Exam: Blood pressure 126/80, pulse 79, temperature 97.8 F (36.6 C), temperature source Temporal, height 6\' 2"  (1.88 m), weight 232 lb 9.6 oz (105.5 kg), SpO2 99 %. Gen:      No acute distress HEENT:  EOMI, sclera anicteric Neck:     No masses; no thyromegaly Lungs:    Clear to auscultation bilaterally; normal respiratory effort CV:         Regular rate and rhythm; no murmurs Abd:      + bowel sounds; soft,  non-tender; no palpable masses, no distension Ext:    No edema; adequate peripheral perfusion Skin:      Warm and dry; no rash Neuro: alert and oriented x 3 Psych: normal mood and affect  Data Reviewed: Imaging Chest x-ray 11/01/17- no acute lung abnormality, hyperinflated lungs, calcified granulomas in the left lung stable since 2007 Chest x-ray 12/10/2019-no active cardiopulmonary disease  I have reviewed the images personally  PFTs 12/22/2017 FVC 5.03 [89%), FEV1 3.52 [83%], F/F 70, TLC 116%, DLCO 86% Mild obstructive airway disease.  ACT  score 10/22/19- 23  Labs Alpha-1 antitrypsin 04/04/2019-131, PI MM  CBC 04/04/2019-WBC 8.6, eos 1.7%, absolute eosinophil count 146 IgE 04/04/2019-19  Assessment:  Asthma PFTs with moderate obstruction.  Although he has a diagnosis of COPD in the chart I suspect asthma versus main issue as he has minimal smoking history.  He is doing well on Symbicort.  Continue current therapy We will keep inhaled corticosteroids as he had peripheral eosinophils in the past.  Return to clinic in 1 year.  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  The granulomas have been stable since at least 2007 on review of his chest x-rays.    Health maintenance 12/22/2017-Pneumovax . Plan/Recommendations: - Continue Symbicort, albuterol as needed.  Follow-up in 1 year  Marshell Garfinkel MD [facility] 10/21/2020, 9:59 AM  CC: Celene Squibb, MD

## 2020-10-21 NOTE — Patient Instructions (Signed)
I am glad you are doing well with your breathing Follow-up in clinic in 1 year.

## 2020-11-04 DIAGNOSIS — H4060X0 Glaucoma secondary to drugs, unspecified eye, stage unspecified: Secondary | ICD-10-CM | POA: Diagnosis not present

## 2020-11-07 ENCOUNTER — Other Ambulatory Visit: Payer: Self-pay | Admitting: Pulmonary Disease

## 2020-11-13 ENCOUNTER — Ambulatory Visit: Payer: Medicare Other | Admitting: Urology

## 2020-11-13 DIAGNOSIS — R339 Retention of urine, unspecified: Secondary | ICD-10-CM

## 2021-01-13 ENCOUNTER — Other Ambulatory Visit: Payer: Self-pay

## 2021-01-13 ENCOUNTER — Encounter: Payer: Self-pay | Admitting: Urology

## 2021-01-13 ENCOUNTER — Ambulatory Visit: Payer: Medicare Other | Admitting: Urology

## 2021-01-13 VITALS — BP 115/77 | HR 92 | Ht 74.0 in | Wt 223.0 lb

## 2021-01-13 DIAGNOSIS — N401 Enlarged prostate with lower urinary tract symptoms: Secondary | ICD-10-CM | POA: Diagnosis not present

## 2021-01-13 DIAGNOSIS — N138 Other obstructive and reflux uropathy: Secondary | ICD-10-CM

## 2021-01-13 DIAGNOSIS — R339 Retention of urine, unspecified: Secondary | ICD-10-CM | POA: Diagnosis not present

## 2021-01-13 LAB — URINALYSIS, ROUTINE W REFLEX MICROSCOPIC
Bilirubin, UA: NEGATIVE
Glucose, UA: NEGATIVE
Ketones, UA: NEGATIVE
Leukocytes,UA: NEGATIVE
Nitrite, UA: NEGATIVE
Protein,UA: NEGATIVE
RBC, UA: NEGATIVE
Specific Gravity, UA: 1.015 (ref 1.005–1.030)
Urobilinogen, Ur: 0.2 mg/dL (ref 0.2–1.0)
pH, UA: 7 (ref 5.0–7.5)

## 2021-01-13 MED ORDER — TAMSULOSIN HCL 0.4 MG PO CAPS
0.4000 mg | ORAL_CAPSULE | Freq: Two times a day (BID) | ORAL | 11 refills | Status: DC
Start: 2021-01-13 — End: 2021-12-30

## 2021-01-13 NOTE — Progress Notes (Signed)
PVR= 85 ml   Urological Symptom Review  Patient is experiencing the following symptoms: Get up at night to urinate Erection problems (male only)   Review of Systems  Gastrointestinal (upper)  : Negative for upper GI symptoms  Gastrointestinal (lower) : Negative for lower GI symptoms  Constitutional : Negative for symptoms  Skin: Negative for skin symptoms  Eyes: Negative for eye symptoms  Ear/Nose/Throat : Negative for Ear/Nose/Throat symptoms  Hematologic/Lymphatic: Negative for Hematologic/Lymphatic symptoms  Cardiovascular : Negative for cardiovascular symptoms  Respiratory : Negative for respiratory symptoms  Endocrine: Negative for endocrine symptoms  Musculoskeletal: Negative for musculoskeletal symptoms  Neurological: Negative for neurological symptoms  Psychologic: Negative for psychiatric symptoms

## 2021-01-13 NOTE — Progress Notes (Signed)
01/13/2021 3:31 PM   Jeffrey Gordon 1956/01/04 [id number]  Referring provider: Celene Squibb, MD [place],  [place] [address]  BPh followup  HPI: [name] is a 65yo here for followup for BPH with incomplete emptying. He is on flomax 0.4mg  BID. PVR 85cc. IPPS 11 with QOL 1. No other complaints today   PMH: Past Medical History:  Diagnosis Date  . Acid reflux   . Allergy   . Anxiety   . Asthma   . Blurred vision, left eye   . Cataract   . COPD (chronic obstructive pulmonary disease) ([place])   . Depression   . GERD (gastroesophageal reflux disease)   . High cholesterol   . Hypertension   . Hypothyroidism   . Hypothyroidism     Surgical History: Past Surgical History:  Procedure Laterality Date  . CATARACT EXTRACTION W/ INTRAOCULAR LENS  IMPLANT, BILATERAL     2002 (left); 2012 (right)  . COLONOSCOPY    . EYE SURGERY    . [place]   left  . INGUINAL HERNIA REPAIR Right 04/10/2020   Procedure: HERNIA REPAIR INGUINAL ADULT;  Surgeon: Virl Cagey, MD;  Location: [facility];  Service: General;  Laterality: Right;  . PARS PLANA VITRECTOMY  10/20/11   "took out the old lens implant and put a new one in"  . PARS PLANA VITRECTOMY  10/20/2011   Procedure: PARS PLANA VITRECTOMY WITH 25G REMOVAL/SUTURE INTRAOCULAR LENS;  Surgeon: Hayden Pedro, MD;  Location: [facility];  Service: Ophthalmology;  Laterality: Left;  . RETINAL DETACHMENT SURGERY  2002   left  . RETINAL DETACHMENT SURGERY  2003   "repair; left"    Home Medications:  Allergies as of 01/13/2021      Reactions   Durezol [difluprednate] Other (See Comments)   "Pressure increased in my eye"   Advair Diskus [fluticasone-salmeterol] Other (See Comments)   Issues sleeping      Medication List       Accurate as of January 13, 2021  3:31 PM. If you have any questions, ask your nurse or doctor.        STOP taking these medications   chlorproMAZINE 10 MG tablet Commonly known as:  THORAZINE Stopped by: Nicolette Bang, MD   dicyclomine 20 MG tablet Commonly known as: BENTYL Stopped by: Nicolette Bang, MD   Melatonin 3 MG Caps Stopped by: Nicolette Bang, MD     TAKE these medications   aspirin EC 81 MG tablet Take 1 tablet (81 mg total) by mouth daily with breakfast.   CO ENZYME Q-10 PO Take 1 capsule by mouth daily.   Combigan 0.2-0.5 % ophthalmic solution Generic drug: brimonidine-timolol Place 1 drop into the right eye in the morning and at bedtime.   diazepam 2 MG tablet Commonly known as: Valium Take 1 tablet (2 mg total) by mouth every 6 (six) hours as needed for anxiety.   FLUoxetine 40 MG capsule Commonly known as: PROZAC Take 40 mg by mouth daily.   latanoprost 0.005 % ophthalmic solution Commonly known as: XALATAN Place 1 drop into both eyes at bedtime.   levothyroxine 88 MCG tablet Commonly known as: SYNTHROID Take 88 mcg by mouth daily before breakfast.   losartan 100 MG tablet Commonly known as: COZAAR Take 50 mg by mouth daily.   omeprazole 20 MG capsule Commonly known as: PRILOSEC Take 20 mg by mouth daily.   PRESERVISION AREDS 2 PO Take 1 capsule  by mouth in the morning and at bedtime.   rosuvastatin 10 MG tablet Commonly known as: CRESTOR Take 10 mg by mouth daily.   sildenafil 100 MG tablet Commonly known as: VIAGRA Take 100 mg by mouth daily as needed.   Symbicort 160-4.5 MCG/ACT inhaler Generic drug: budesonide-formoterol INHALE 2 PUFFS INTO THE LUNGS TWICE DAILY   tamsulosin 0.4 MG Caps capsule Commonly known as: FLOMAX Take 1 capsule (0.4 mg total) by mouth 2 (two) times daily.       Allergies:  Allergies  Allergen Reactions  . Durezol [Difluprednate] Other (See Comments)    "Pressure increased in my eye"  . Advair Diskus [Fluticasone-Salmeterol] Other (See Comments)    Issues sleeping    Family History: Family History  Problem Relation Age of Onset  . Colon cancer Father   . Colon  cancer Paternal Aunt   . Esophageal cancer Neg Hx   . Rectal cancer Neg Hx   . Stomach cancer Neg Hx     Social History:  reports that he has never smoked. He has never used smokeless tobacco. He reports previous alcohol use. He reports that he does not use drugs.  ROS: All other review of systems were reviewed and are negative except what is noted above in HPI  Physical Exam: BP 115/77   Pulse 92   Ht 6\' 2"  (1.88 m)   Wt 223 lb (101.2 kg) Comment: per patient  BMI 28.63 kg/m   Constitutional:  Alert and oriented, No acute distress. HEENT: [place] AT, moist mucus membranes.  Trachea midline, no masses. Cardiovascular: No clubbing, cyanosis, or edema. Respiratory: Normal respiratory effort, no increased work of breathing. GI: Abdomen is soft, nontender, nondistended, no abdominal masses GU: No CVA tenderness.  Lymph: No cervical or inguinal lymphadenopathy. Skin: No rashes, bruises or suspicious lesions. Neurologic: Grossly intact, no focal deficits, moving all 4 extremities. Psychiatric: Normal mood and affect.  Laboratory Data: Lab Results  Component Value Date   WBC 11.7 (H) 04/16/2020   HGB 8.8 (L) 04/16/2020   HCT 26.2 (L) 04/16/2020   MCV 89.4 04/16/2020   PLT 313 04/16/2020    Lab Results  Component Value Date   CREATININE 0.63 04/17/2020    Lab Results  Component Value Date   PSA Dr. Oswaldo Milian follows 04/24/2008   PSA normal (0.58) 10/28/2006    No results found for: TESTOSTERONE  No results found for: HGBA1C  Urinalysis    Component Value Date/Time   COLORURINE YELLOW 04/13/2020 1120   APPEARANCEUR Clear 05/27/2020 0939   LABSPEC 1.024 04/13/2020 1120   PHURINE 6.0 04/13/2020 1120   GLUCOSEU Negative 05/27/2020 0939   HGBUR NEGATIVE 04/13/2020 1120   HGBUR negative 04/24/2008 0820   BILIRUBINUR Negative 05/27/2020 0939   KETONESUR negative 04/20/2020 1836   KETONESUR 20 (A) 04/13/2020 1120   PROTEINUR Negative 05/27/2020 0939   PROTEINUR 30 (A)  04/13/2020 1120   UROBILINOGEN 4.0 (A) 04/20/2020 1836   UROBILINOGEN 0.2 04/24/2008 0820   NITRITE Negative 05/27/2020 0939   NITRITE NEGATIVE 04/13/2020 1120   LEUKOCYTESUR Negative 05/27/2020 0939   LEUKOCYTESUR NEGATIVE 04/13/2020 1120    Lab Results  Component Value Date   LABMICR Comment 05/27/2020   BACTERIA NONE SEEN 04/13/2020    Pertinent Imaging:  No results found for this or any previous visit.  No results found for this or any previous visit.  No results found for this or any previous visit.  No results found for this or any previous visit.  No results found for this or any previous visit.  No results found for this or any previous visit.  No results found for this or any previous visit.  No results found for this or any previous visit.   Assessment & Plan:    1. 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  Nicolette Bang, MD  [facility] [place]

## 2021-01-13 NOTE — Patient Instructions (Signed)

## 2021-03-08 DIAGNOSIS — R7303 Prediabetes: Secondary | ICD-10-CM | POA: Diagnosis not present

## 2021-03-08 DIAGNOSIS — E039 Hypothyroidism, unspecified: Secondary | ICD-10-CM | POA: Diagnosis not present

## 2021-03-08 DIAGNOSIS — D513 Other dietary vitamin B12 deficiency anemia: Secondary | ICD-10-CM | POA: Diagnosis not present

## 2021-03-08 DIAGNOSIS — E782 Mixed hyperlipidemia: Secondary | ICD-10-CM | POA: Diagnosis not present

## 2021-03-08 DIAGNOSIS — E559 Vitamin D deficiency, unspecified: Secondary | ICD-10-CM | POA: Diagnosis not present

## 2021-03-15 DIAGNOSIS — E039 Hypothyroidism, unspecified: Secondary | ICD-10-CM | POA: Diagnosis not present

## 2021-03-15 DIAGNOSIS — J449 Chronic obstructive pulmonary disease, unspecified: Secondary | ICD-10-CM | POA: Diagnosis not present

## 2021-03-15 DIAGNOSIS — K219 Gastro-esophageal reflux disease without esophagitis: Secondary | ICD-10-CM | POA: Diagnosis not present

## 2021-03-15 DIAGNOSIS — I10 Essential (primary) hypertension: Secondary | ICD-10-CM | POA: Diagnosis not present

## 2021-03-15 DIAGNOSIS — R197 Diarrhea, unspecified: Secondary | ICD-10-CM | POA: Diagnosis not present

## 2021-03-15 DIAGNOSIS — R7303 Prediabetes: Secondary | ICD-10-CM | POA: Diagnosis not present

## 2021-03-15 DIAGNOSIS — E782 Mixed hyperlipidemia: Secondary | ICD-10-CM | POA: Diagnosis not present

## 2021-03-15 DIAGNOSIS — R251 Tremor, unspecified: Secondary | ICD-10-CM | POA: Diagnosis not present

## 2021-04-06 DIAGNOSIS — L570 Actinic keratosis: Secondary | ICD-10-CM | POA: Diagnosis not present

## 2021-04-06 DIAGNOSIS — X32XXXA Exposure to sunlight, initial encounter: Secondary | ICD-10-CM | POA: Diagnosis not present

## 2021-04-06 DIAGNOSIS — D225 Melanocytic nevi of trunk: Secondary | ICD-10-CM | POA: Diagnosis not present

## 2021-04-06 DIAGNOSIS — D485 Neoplasm of uncertain behavior of skin: Secondary | ICD-10-CM | POA: Diagnosis not present

## 2021-06-10 DIAGNOSIS — H40053 Ocular hypertension, bilateral: Secondary | ICD-10-CM | POA: Diagnosis not present

## 2021-06-10 DIAGNOSIS — H35363 Drusen (degenerative) of macula, bilateral: Secondary | ICD-10-CM | POA: Diagnosis not present

## 2021-06-22 DIAGNOSIS — E039 Hypothyroidism, unspecified: Secondary | ICD-10-CM | POA: Diagnosis not present

## 2021-08-02 DIAGNOSIS — R509 Fever, unspecified: Secondary | ICD-10-CM | POA: Diagnosis not present

## 2021-08-02 DIAGNOSIS — R112 Nausea with vomiting, unspecified: Secondary | ICD-10-CM | POA: Diagnosis not present

## 2021-09-06 ENCOUNTER — Telehealth: Payer: Self-pay | Admitting: Pulmonary Disease

## 2021-09-06 MED ORDER — BUDESONIDE-FORMOTEROL FUMARATE 160-4.5 MCG/ACT IN AERO: 2.0000 | INHALATION_SPRAY | Freq: Two times a day (BID) | RESPIRATORY_TRACT | 0 refills | Status: DC

## 2021-09-06 NOTE — Telephone Encounter (Signed)
ATC patient.  Left detailed message (DPR) on answering machine/ VM Symbicort refill sent to requested pharmacy and follow up [place] reminder for 10/25/21.

## 2021-09-07 DIAGNOSIS — D225 Melanocytic nevi of trunk: Secondary | ICD-10-CM | POA: Diagnosis not present

## 2021-09-10 DIAGNOSIS — E039 Hypothyroidism, unspecified: Secondary | ICD-10-CM | POA: Diagnosis not present

## 2021-09-10 DIAGNOSIS — R7303 Prediabetes: Secondary | ICD-10-CM | POA: Diagnosis not present

## 2021-09-10 DIAGNOSIS — I10 Essential (primary) hypertension: Secondary | ICD-10-CM | POA: Diagnosis not present

## 2021-09-15 DIAGNOSIS — E782 Mixed hyperlipidemia: Secondary | ICD-10-CM | POA: Diagnosis not present

## 2021-09-15 DIAGNOSIS — K219 Gastro-esophageal reflux disease without esophagitis: Secondary | ICD-10-CM | POA: Diagnosis not present

## 2021-09-15 DIAGNOSIS — R251 Tremor, unspecified: Secondary | ICD-10-CM | POA: Diagnosis not present

## 2021-09-15 DIAGNOSIS — E039 Hypothyroidism, unspecified: Secondary | ICD-10-CM | POA: Diagnosis not present

## 2021-09-15 DIAGNOSIS — R197 Diarrhea, unspecified: Secondary | ICD-10-CM | POA: Diagnosis not present

## 2021-09-15 DIAGNOSIS — I10 Essential (primary) hypertension: Secondary | ICD-10-CM | POA: Diagnosis not present

## 2021-09-15 DIAGNOSIS — R7303 Prediabetes: Secondary | ICD-10-CM | POA: Diagnosis not present

## 2021-09-15 DIAGNOSIS — J449 Chronic obstructive pulmonary disease, unspecified: Secondary | ICD-10-CM | POA: Diagnosis not present

## 2021-09-22 DIAGNOSIS — H40012 Open angle with borderline findings, low risk, left eye: Secondary | ICD-10-CM | POA: Diagnosis not present

## 2021-10-25 ENCOUNTER — Ambulatory Visit: Payer: Medicare Other | Admitting: Pulmonary Disease

## 2021-10-25 ENCOUNTER — Other Ambulatory Visit: Payer: Self-pay

## 2021-10-25 ENCOUNTER — Encounter: Payer: Self-pay | Admitting: Pulmonary Disease

## 2021-10-25 ENCOUNTER — Ambulatory Visit (INDEPENDENT_AMBULATORY_CARE_PROVIDER_SITE_OTHER): Payer: Medicare Other

## 2021-10-25 VITALS — BP 130/64 | HR 67 | Temp 97.8°F | Ht 75.0 in | Wt 212.8 lb

## 2021-10-25 DIAGNOSIS — J4540 Moderate persistent asthma, uncomplicated: Secondary | ICD-10-CM

## 2021-10-25 DIAGNOSIS — J45909 Unspecified asthma, uncomplicated: Secondary | ICD-10-CM | POA: Diagnosis not present

## 2021-10-25 NOTE — Patient Instructions (Signed)
I am glad you are stable with regard to breathing ?He can use the Symbicort 1 puff twice daily since her asthma is under good control ?We will get a chest x-ray today ?Follow-up in 1 year. ?

## 2021-10-25 NOTE — Progress Notes (Signed)
? ?      ?Jeffrey Gordon    [id number]    26-Feb-1956 ? ?Primary Care Physician:Jeffrey Gordon, Jeffrey Areola, Jeffrey Gordon ? ?Referring Physician: Celene Squibb, Jeffrey Gordon ?[address] ?[address] ?[place],  [place] [address] ? ?Chief complaint: Follow-up for Asthma ? ?HPI: ?66 year old with past medical history of GERD, hypothyroidism, hyperlipidemia ?Evaluated in 2019 for dyspnea, hyperinflation and obstructive airway disease secondary to asthma, possible COPD. Started on Symbicort with improvement in symptoms ? 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  No other significant lung issues.  No family history of lung problems except for pulmonary embolism in his grandfather after immobilization secondary to surgery. ? ?S/p  hernia repair in August 2021.  Hospitalized in September 2021 for hyponatremia and blood loss anemia. ? ?Pets: Cat, no birds, farm animals  ?Occupation: Retired Optometrist ?Exposures: No known exposures, no mold, hot tub ?Smoking history: Smoked for a year high school ?Travel History: Lived in [place], [place], [place], [place] and [place] ? ?Interim history: ?Breathing is stable.  Continues on Symbicort.  Does not need to use rescue inhaler. ? ?Outpatient Encounter Medications as of 10/25/2021  ?Medication Sig  ? aspirin EC 81 MG tablet Take 1 tablet (81 mg total) by mouth daily with breakfast.  ? budesonide-formoterol (SYMBICORT) 160-4.5 MCG/ACT inhaler Inhale 2 puffs into the lungs 2 (two) times daily.  ? CO ENZYME Q-10 PO Take 1 capsule by mouth daily.   ? diazepam (VALIUM) 2 MG tablet Take 1 tablet (2 mg total) by mouth every 6 (six) hours as needed for anxiety. (Patient taking differently: Take 2 mg by mouth every 6 (six) hours as needed. For sleep)  ? FLUoxetine (PROZAC) 40 MG capsule Take 40 mg by mouth daily.  ? latanoprost (XALATAN) 0.005 % ophthalmic solution Place 1 drop into both eyes at bedtime.   ? levothyroxine (SYNTHROID, LEVOTHROID) 88 MCG tablet Take 88 mcg by mouth daily before breakfast.    ? losartan (COZAAR) 100 MG tablet Take 50 mg by mouth daily.  ? Multiple Vitamins-Minerals (PRESERVISION AREDS 2 PO) Take 1 capsule by mouth in the morning and at bedtime.  ? omeprazole (PRILOSEC) 20 MG capsule Take 20 mg by mouth daily.  ? rosuvastatin (CRESTOR) 10 MG tablet Take 10 mg by mouth daily.  ? sildenafil (VIAGRA) 100 MG tablet Take 100 mg by mouth daily as needed.   ? tamsulosin (FLOMAX) 0.4 MG CAPS capsule Take 1 capsule (0.4 mg total) by mouth 2 (two) times daily.  ? [DISCONTINUED] brimonidine-timolol (COMBIGAN) 0.2-0.5 % ophthalmic solution Place 1 drop into the right eye in the morning and at bedtime.  (Patient not taking: Reported on 01/13/2021)  ? ?No facility-administered encounter medications on file as of 10/25/2021.  ? ?Physical Exam: ?Blood pressure 130/64, pulse 67, temperature 97.8 ?F (36.6 ?C), temperature source Oral, height '6\' 3"'$  (1.905 m), weight 212 lb 12.8 oz (96.5 kg), SpO2 100 %. ?Gen:      No acute distress ?HEENT:  EOMI, sclera anicteric ?Neck:     No masses; no thyromegaly ?Lungs:    Clear to auscultation bilaterally; normal respiratory effort ?CV:         Regular rate and rhythm; no murmurs ?Abd:      + bowel sounds; soft, non-tender; no palpable masses, no distension ?Ext:    No edema; adequate peripheral perfusion ?Skin:      Warm and dry; no rash ?Neuro: alert and oriented x 3 ?Psych: normal mood and  affect  ? ?Data Reviewed: ?Imaging ?Chest x-ray 11/01/17- no acute lung abnormality, hyperinflated lungs, calcified granulomas in the left lung stable since 2007 ?Chest x-ray 12/10/2019-no active cardiopulmonary disease ?Chest x-ray/30/21-right upper lobe patchy opacity.  Small left pleural effusion, atelectasis ?I have reviewed the images personally ? ?PFTs 12/22/2017 ?FVC 5.03 [89%), FEV1 3.52 [83%], F/F 70, TLC 116%, DLCO 86% ?Mild obstructive airway disease. ? ?ACT score 10/22/19- 23 ? ?Labs ?Alpha-1 antitrypsin 04/04/2019-131, PI MM ? ?CBC 04/04/2019-WBC 8.6, eos 1.7%, absolute  eosinophil count 146 ?IgE 04/04/2019-19 ? ?Assessment:  ?Asthma ?PFTs with moderate obstruction.  Although he has a diagnosis of COPD in the chart I suspect asthma versus main issue as he has minimal smoking history. ?We will keep inhaled corticosteroids as he had peripheral eosinophils in the past. ?We can de-escalate therapy to Symbicort 1 puff twice daily instead of 2 puffs ?Return to clinic in 1 year.  If he continues to be stable then consider discharge from pulmonary clinic ? 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  The granulomas have been stable since at least 2007 on review of his chest x-rays.   ?Repeat chest x-ray today ? ?Health maintenance ?12/22/2017-Pneumovax ?Marland Kitchen ?Plan/Recommendations: ?- Reduce Symbicort dose ?- Chest x-ray ? ? Follow-up in 1 year ? ?Marshell Garfinkel Jeffrey Gordon ?[facility] ?10/25/2021, 10:45 AM ? ?CC: Celene Squibb, Jeffrey Gordon ? ? ?

## 2021-11-17 ENCOUNTER — Other Ambulatory Visit: Payer: Self-pay | Admitting: *Deleted

## 2021-11-17 DIAGNOSIS — J8410 Pulmonary fibrosis, unspecified: Secondary | ICD-10-CM

## 2021-12-23 ENCOUNTER — Other Ambulatory Visit: Payer: Self-pay | Admitting: Urology

## 2021-12-31 ENCOUNTER — Ambulatory Visit (HOSPITAL_COMMUNITY)
Admission: RE | Admit: 2021-12-31 | Discharge: 2021-12-31 | Disposition: A | Discharge status: Home / Self Care | Payer: Medicare Other | Source: Ambulatory Visit | Attending: Pulmonary Disease | Admitting: Pulmonary Disease

## 2021-12-31 DIAGNOSIS — I700 Atherosclerosis of aorta: Secondary | ICD-10-CM | POA: Diagnosis not present

## 2021-12-31 DIAGNOSIS — R918 Other nonspecific abnormal finding of lung field: Secondary | ICD-10-CM | POA: Diagnosis not present

## 2021-12-31 DIAGNOSIS — I2510 Atherosclerotic heart disease of native coronary artery without angina pectoris: Secondary | ICD-10-CM | POA: Diagnosis not present

## 2021-12-31 DIAGNOSIS — J8410 Pulmonary fibrosis, unspecified: Secondary | ICD-10-CM | POA: Insufficient documentation

## 2021-12-31 DIAGNOSIS — J984 Other disorders of lung: Secondary | ICD-10-CM | POA: Diagnosis not present

## 2022-01-13 ENCOUNTER — Other Ambulatory Visit: Payer: Self-pay | Admitting: Pulmonary Disease

## 2022-01-17 ENCOUNTER — Ambulatory Visit: Payer: Medicare Other | Admitting: Urology

## 2022-01-19 ENCOUNTER — Ambulatory Visit: Payer: Medicare Other | Admitting: Urology

## 2022-01-19 ENCOUNTER — Encounter: Payer: Self-pay | Admitting: Urology

## 2022-01-19 VITALS — BP 148/88 | HR 74 | Ht 75.0 in | Wt 203.0 lb

## 2022-01-19 DIAGNOSIS — R339 Retention of urine, unspecified: Secondary | ICD-10-CM | POA: Diagnosis not present

## 2022-01-19 DIAGNOSIS — N138 Other obstructive and reflux uropathy: Secondary | ICD-10-CM | POA: Diagnosis not present

## 2022-01-19 DIAGNOSIS — N401 Enlarged prostate with lower urinary tract symptoms: Secondary | ICD-10-CM | POA: Diagnosis not present

## 2022-01-19 LAB — URINALYSIS, ROUTINE W REFLEX MICROSCOPIC
Bilirubin, UA: NEGATIVE
Glucose, UA: NEGATIVE
Ketones, UA: NEGATIVE
Leukocytes,UA: NEGATIVE
Nitrite, UA: NEGATIVE
Protein,UA: NEGATIVE
RBC, UA: NEGATIVE
Specific Gravity, UA: 1.01 (ref 1.005–1.030)
Urobilinogen, Ur: 0.2 mg/dL (ref 0.2–1.0)
pH, UA: 7 (ref 5.0–7.5)

## 2022-01-19 LAB — BLADDER SCAN AMB NON-IMAGING: Scan Result: 237

## 2022-01-19 MED ORDER — TAMSULOSIN HCL 0.4 MG PO CAPS: ORAL_CAPSULE | ORAL | 3 refills | Status: DC

## 2022-01-19 NOTE — Progress Notes (Signed)
post void residual =288m

## 2022-01-19 NOTE — Patient Instructions (Signed)

## 2022-01-19 NOTE — Progress Notes (Signed)
01/19/2022 9:19 AM   Jeffrey Gordon [birth year] [id number]  Referring provider: Celene Squibb, MD [address],  [place] [address]  Incomplete emptying   HPI: [name] is a 66yo here for followup for BPH with incomplete. He remains on flomax BIS. IPS 15 QOL 3. Nocturia 2-3x. PVR 237cc. Urine stream strong. No other complaints today   PMH: Past Medical History:  Diagnosis Date   Acid reflux    Allergy    Anxiety    Asthma    Blurred vision, left eye    Cataract    COPD (chronic obstructive pulmonary disease) ([facility])    Depression    GERD (gastroesophageal reflux disease)    High cholesterol    Hypertension    Hypothyroidism    Hypothyroidism     Surgical History: Past Surgical History:  Procedure Laterality Date   CATARACT EXTRACTION W/ INTRAOCULAR LENS  IMPLANT, BILATERAL     2002 (left); 2012 (right)   COLONOSCOPY     EYE SURGERY     INGUINAL HERNIA REPAIR  1989   left   INGUINAL HERNIA REPAIR Right 04/10/2020   Procedure: HERNIA REPAIR INGUINAL ADULT;  Surgeon: Virl Cagey, MD;  Location: [facility];  Service: General;  Laterality: Right;   PARS PLANA VITRECTOMY  10/20/11   "took out the old lens implant and put a new one in"   PARS PLANA VITRECTOMY  10/20/2011   Procedure: PARS PLANA VITRECTOMY WITH 25G REMOVAL/SUTURE INTRAOCULAR LENS;  Surgeon: Hayden Pedro, MD;  Location: [facility];  Service: Ophthalmology;  Laterality: Left;   RETINAL DETACHMENT SURGERY  2002   left   RETINAL DETACHMENT SURGERY  2003   "repair; left"    Home Medications:  Allergies as of 01/19/2022       Reactions   Durezol [difluprednate] Other (See Comments)   "Pressure increased in my eye"   Advair Diskus [fluticasone-salmeterol] Other (See Comments)   Issues sleeping        Medication List        Accurate as of January 19, 2022  9:19 AM. If you have any questions, ask your nurse or doctor.          aspirin EC 81 MG tablet Take 1 tablet (81 mg total) by mouth  daily with breakfast.   CO ENZYME Q-10 PO Take 1 capsule by mouth daily.   diazepam 2 MG tablet Commonly known as: Valium Take 1 tablet (2 mg total) by mouth every 6 (six) hours as needed for anxiety. What changed:  reasons to take this additional instructions   FLUoxetine 40 MG capsule Commonly known as: PROZAC Take 40 mg by mouth daily.   latanoprost 0.005 % ophthalmic solution Commonly known as: XALATAN Place 1 drop into both eyes at bedtime.   levothyroxine 88 MCG tablet Commonly known as: SYNTHROID Take 88 mcg by mouth daily before breakfast.   losartan 100 MG tablet Commonly known as: COZAAR Take 50 mg by mouth daily.   omeprazole 20 MG capsule Commonly known as: PRILOSEC Take 20 mg by mouth daily.   PRESERVISION AREDS 2 PO Take 1 capsule by mouth in the morning and at bedtime.   rosuvastatin 10 MG tablet Commonly known as: CRESTOR Take 10 mg by mouth daily.   sildenafil 100 MG tablet Commonly known as: VIAGRA Take 100 mg by mouth daily as needed.   Symbicort 160-4.5 MCG/ACT inhaler Generic drug: budesonide-formoterol INHALE 2 PUFFS INTO THE LUNGS TWICE DAILY  tamsulosin 0.4 MG Caps capsule Commonly known as: FLOMAX TAKE 1 CAPSULE(0.4 MG) BY MOUTH TWICE DAILY        Allergies:  Allergies  Allergen Reactions   Durezol [Difluprednate] Other (See Comments)    "Pressure increased in my eye"   Advair Diskus [Fluticasone-Salmeterol] Other (See Comments)    Issues sleeping    Family History: Family History  Problem Relation Age of Onset   Colon cancer Father    Colon cancer Paternal Aunt    Esophageal cancer Neg Hx    Rectal cancer Neg Hx    Stomach cancer Neg Hx     Social History:  reports that he has never smoked. He has never used smokeless tobacco. He reports that he does not currently use alcohol. He reports that he does not use drugs.  ROS: All other review of systems were reviewed and are negative except what is noted above in  HPI  Physical Exam: BP (!) 148/88   Pulse 74   Ht '6\' 3"'$  (1.905 m)   Wt 203 lb (92.1 kg)   BMI 25.37 kg/m   Constitutional:  Alert and oriented, No acute distress. HEENT:  AT, moist mucus membranes.  Trachea midline, no masses. Cardiovascular: No clubbing, cyanosis, or edema. Respiratory: Normal respiratory effort, no increased work of breathing. GI: Abdomen is soft, nontender, nondistended, no abdominal masses GU: No CVA tenderness. Circumcised phallus. No masses/lesions on penis, testis, scrotum. Prostate 40g smooth no nodules no induration.  Lymph: No cervical or inguinal lymphadenopathy. Skin: No rashes, bruises or suspicious lesions. Neurologic: Grossly intact, no focal deficits, moving all 4 extremities. Psychiatric: Normal mood and affect.  Laboratory Data: Lab Results  Component Value Date   WBC 11.7 (H) 04/16/2020   HGB 8.8 (L) 04/16/2020   HCT 26.2 (L) 04/16/2020   MCV 89.4 04/16/2020   PLT 313 04/16/2020    Lab Results  Component Value Date   CREATININE 0.63 04/17/2020    Lab Results  Component Value Date   PSA Dr. Oswaldo Milian follows 04/24/2008   PSA normal (0.58) 10/28/2006    No results found for: TESTOSTERONE  No results found for: HGBA1C  Urinalysis    Component Value Date/Time   COLORURINE YELLOW 04/13/2020 1120   APPEARANCEUR Clear 01/13/2021 1523   LABSPEC 1.024 04/13/2020 1120   PHURINE 6.0 04/13/2020 1120   GLUCOSEU Negative 01/13/2021 1523   HGBUR NEGATIVE 04/13/2020 1120   HGBUR negative 04/24/2008 0820   BILIRUBINUR Negative 01/13/2021 1523   KETONESUR negative 04/20/2020 1836   KETONESUR 20 (A) 04/13/2020 1120   PROTEINUR Negative 01/13/2021 1523   PROTEINUR 30 (A) 04/13/2020 1120   UROBILINOGEN 4.0 (A) 04/20/2020 1836   UROBILINOGEN 0.2 04/24/2008 0820   NITRITE Negative 01/13/2021 1523   NITRITE NEGATIVE 04/13/2020 1120   LEUKOCYTESUR Negative 01/13/2021 1523   LEUKOCYTESUR NEGATIVE 04/13/2020 1120    Lab Results  Component  Value Date   LABMICR Comment 01/13/2021   BACTERIA NONE SEEN 04/13/2020    Pertinent Imaging:  No results found for this or any previous visit.  No results found for this or any previous visit.  No results found for this or any previous visit.  No results found for this or any previous visit.  No results found for this or any previous visit.  No results found for this or any previous visit.  No results found for this or any previous visit.  No results found for this or any previous visit.   Assessment & Plan:  1. Urinary retention -Continue flomax BID - BLADDER SCAN AMB NON-IMAGING - Urinalysis, Routine w reflex microscopic  2. Benign prostatic hyperplasia with urinary obstruction -Continue flomax BID and RTC 1 year with PVR   No follow-ups on file.  Nicolette Bang, MD  [facility] [place]

## 2022-01-20 LAB — PSA: Prostate Specific Ag, Serum: 0.7 ng/mL (ref 0.0–4.0)

## 2022-03-14 DIAGNOSIS — E039 Hypothyroidism, unspecified: Secondary | ICD-10-CM | POA: Diagnosis not present

## 2022-03-14 DIAGNOSIS — R7303 Prediabetes: Secondary | ICD-10-CM | POA: Diagnosis not present

## 2022-03-14 DIAGNOSIS — E782 Mixed hyperlipidemia: Secondary | ICD-10-CM | POA: Diagnosis not present

## 2022-03-17 DIAGNOSIS — R7303 Prediabetes: Secondary | ICD-10-CM | POA: Diagnosis not present

## 2022-03-17 DIAGNOSIS — E782 Mixed hyperlipidemia: Secondary | ICD-10-CM | POA: Diagnosis not present

## 2022-03-17 DIAGNOSIS — I10 Essential (primary) hypertension: Secondary | ICD-10-CM | POA: Diagnosis not present

## 2022-03-17 DIAGNOSIS — J4540 Moderate persistent asthma, uncomplicated: Secondary | ICD-10-CM | POA: Diagnosis not present

## 2022-03-17 DIAGNOSIS — K219 Gastro-esophageal reflux disease without esophagitis: Secondary | ICD-10-CM | POA: Diagnosis not present

## 2022-03-17 DIAGNOSIS — Z0001 Encounter for general adult medical examination with abnormal findings: Secondary | ICD-10-CM | POA: Diagnosis not present

## 2022-03-17 DIAGNOSIS — E039 Hypothyroidism, unspecified: Secondary | ICD-10-CM | POA: Diagnosis not present

## 2022-03-17 DIAGNOSIS — R197 Diarrhea, unspecified: Secondary | ICD-10-CM | POA: Diagnosis not present

## 2022-05-02 IMAGING — CT CT ABD-PELV W/ CM
2 of 6 series · 15 of 46 positions shown, 17 images · IV contrast (omnipaque)
Comparison: None

CLINICAL DATA: Extraperitoneal RIGHT inguinal hernia repair 3 days
ago utilizing (2) Perfix plug and mesh repair RIGHT inguinal canal,
now with RIGHT scrotal swelling and hematoma

EXAM:
CT ABDOMEN AND PELVIS WITH CONTRAST
TECHNIQUE: Multidetector CT imaging of the abdomen and pelvis was performed
using the standard protocol following bolus administration of
intravenous contrast. Sagittal and coronal MPR images reconstructed
from axial data set.
CONTRAST:  100mL OMNIPAQUE IOHEXOL 300 MG/ML SOLN IV. No oral
contrast.

[Series 2: axial st · axial · 0.80mm/px · z∈[-591,-101]mm · 12 of 114 slices shown, 14 images]
[im 8/114  soft-tissue]
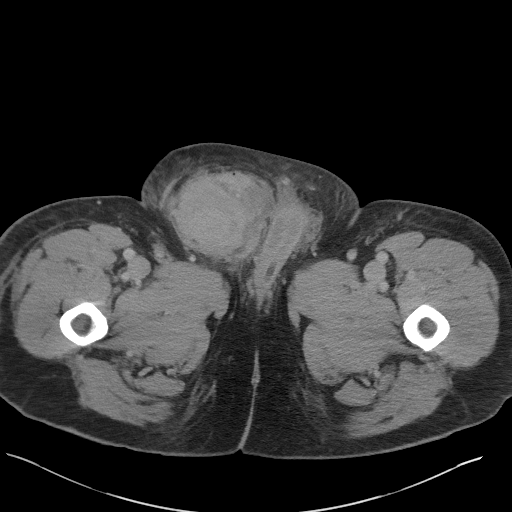
[im 8/114  bone]
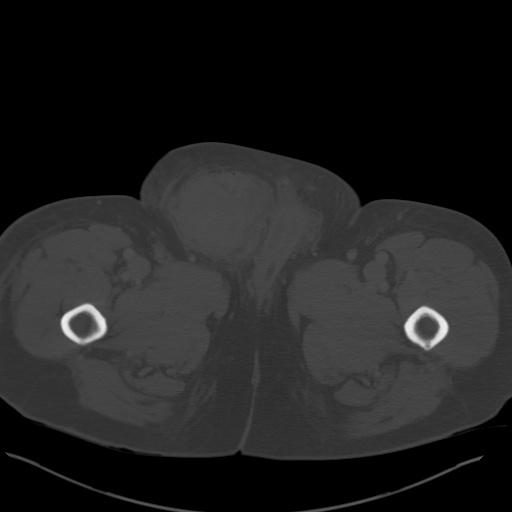
[im 16/114  soft-tissue]
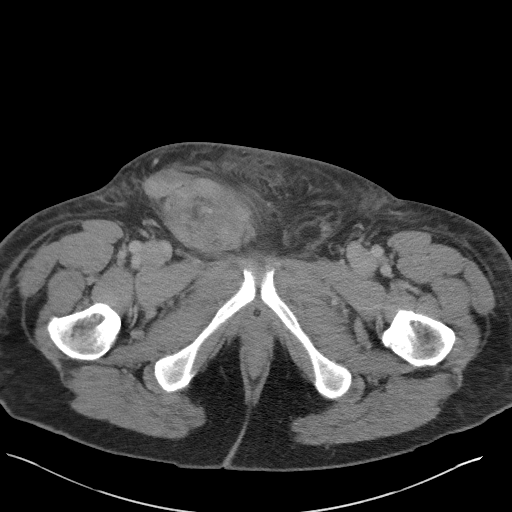
[im 23/114  soft-tissue]
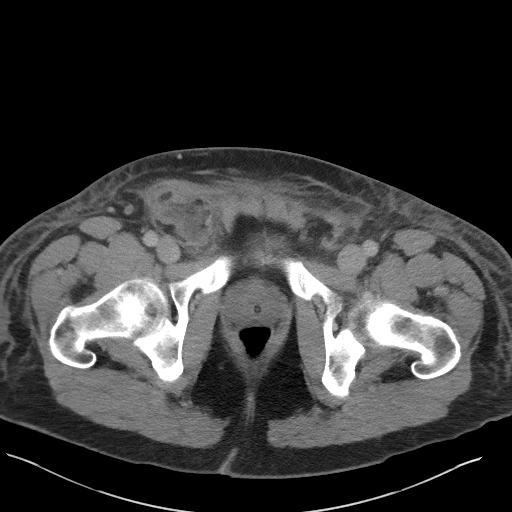
[im 38/114  soft-tissue]
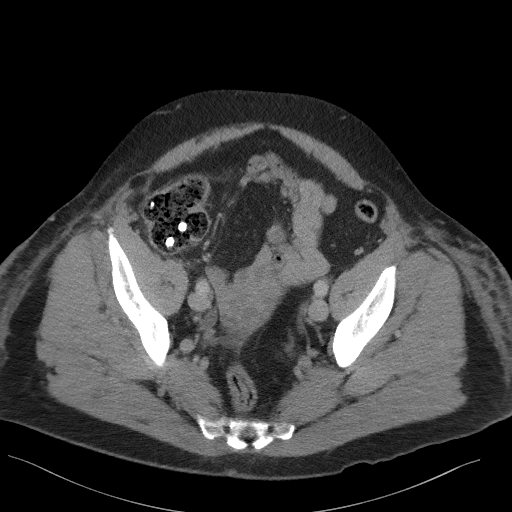
[im 46/114  soft-tissue]
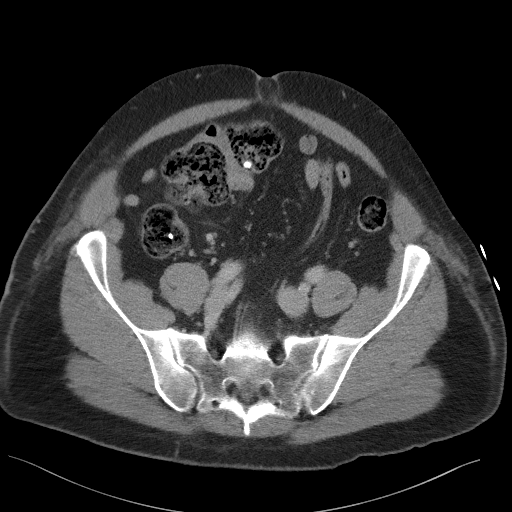
[im 53/114  soft-tissue]
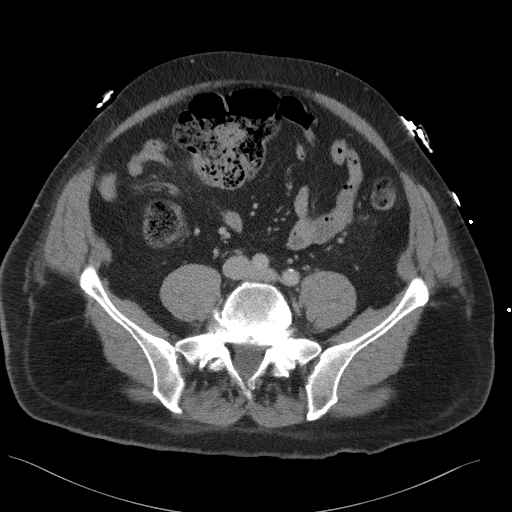
[im 61/114  soft-tissue]
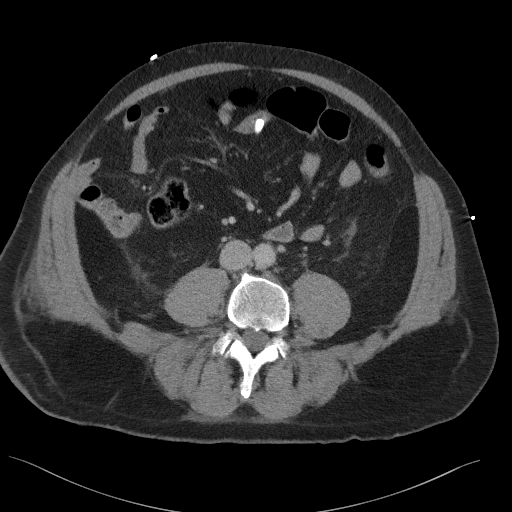
[im 68/114  soft-tissue]
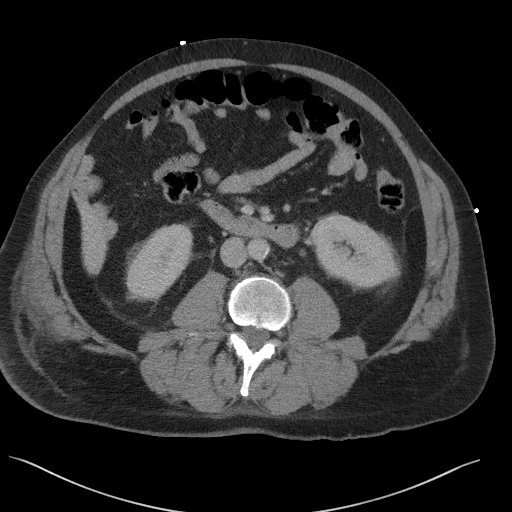
[im 76/114  soft-tissue]
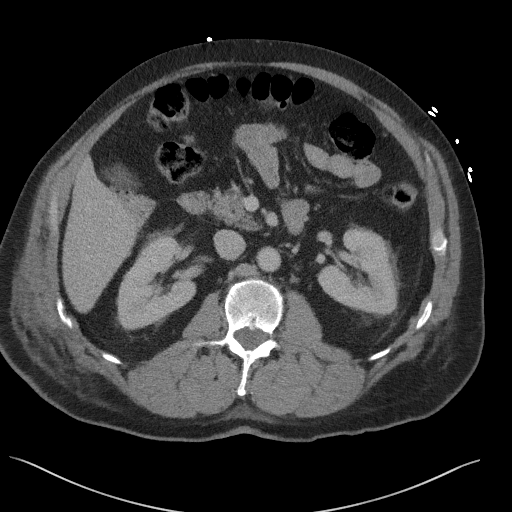
[im 76/114  bone]
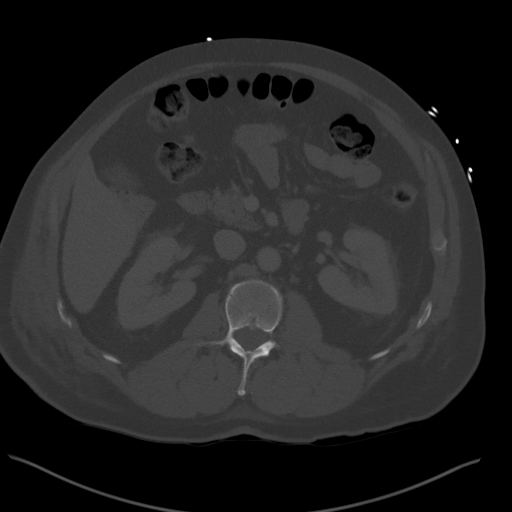
[im 91/114  soft-tissue]
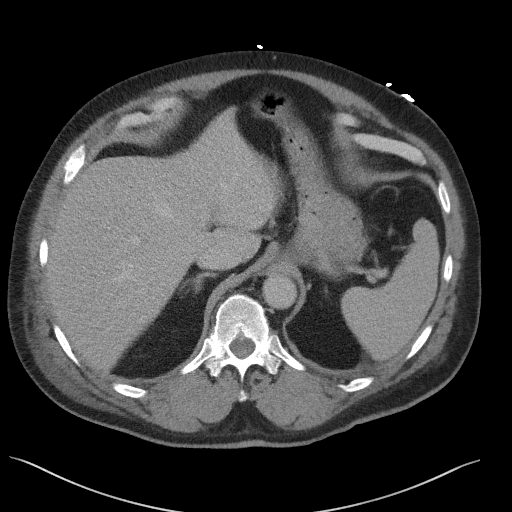
[im 98/114  soft-tissue]
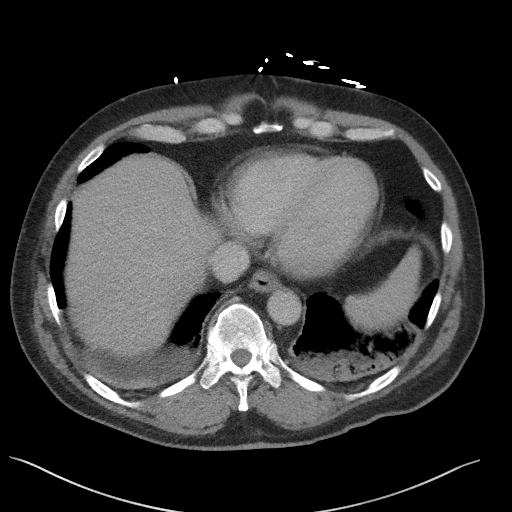
[im 106/114  soft-tissue]
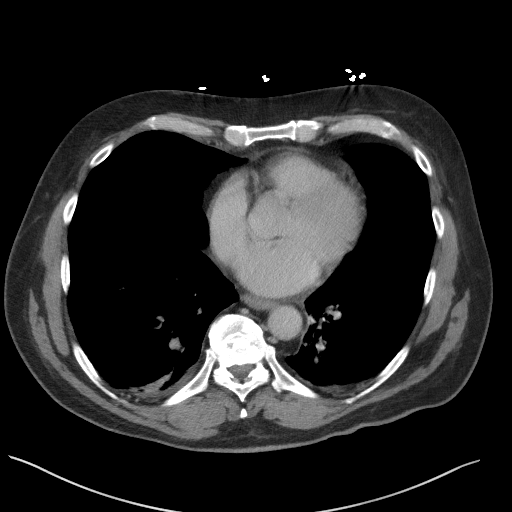

[Series 5: coronal st · coronal · 0.82mm/px · 3 of 118 slices shown]
[im 40/118  soft-tissue]
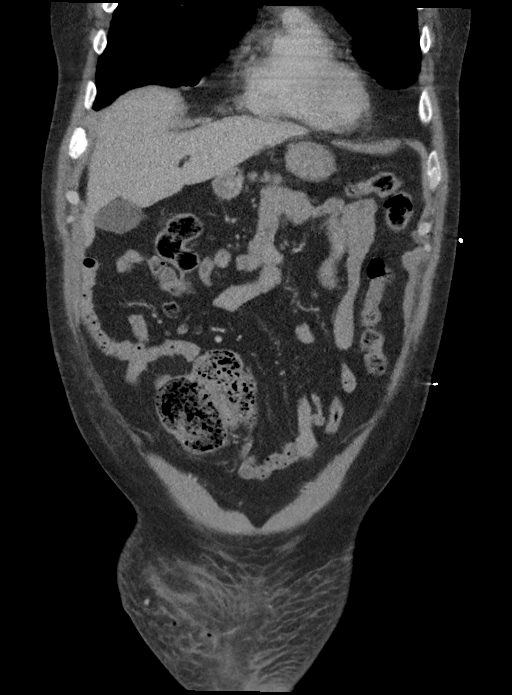
[im 53/118  soft-tissue]
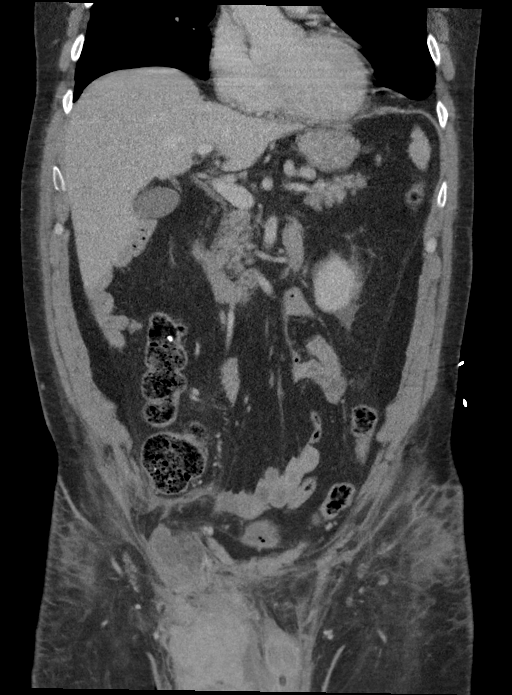
[im 66/118  soft-tissue]
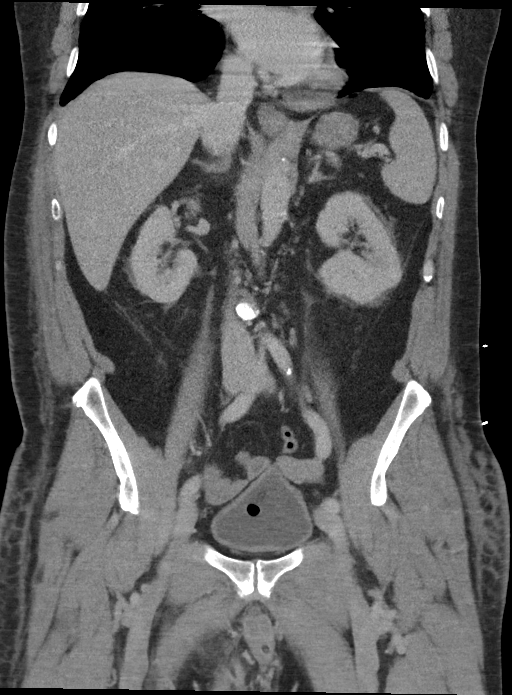

[15 of 46 positions shown; findings below may reference images not displayed]

FINDINGS: Lower chest: Bibasilar atelectasis and minimal pleural effusions

Hepatobiliary: Gallbladder and liver normal appearance

Pancreas: Normal appearance

Spleen: Normal appearance

Adrenals/Urinary Tract: Adrenal glands, kidneys, ureters, and
bladder normal appearance. Foley catheter within urinary bladder.

Stomach/Bowel: Normal appendix. Mildly prominent stool in cecum.
Multiple radiodensities within stool in RIGHT colon, uncertain
etiology. Stomach and bowel loops otherwise normal appearance.

Vascular/Lymphatic: Atherosclerotic calcifications aorta. Aorta
normal caliber. No adenopathy.

Reproductive: Unremarkable prostate gland and seminal vesicles.

Other: Small LEFT inguinal hernia containing fat. Small umbilical
hernia containing fat. No free intraperitoneal air or fluid.
Scattered subcutaneous edema at the perineum and thighs as well as
the lateral aspects of the pelvis.

Irregular high attenuation material within RIGHT scrotal sac
extending into distal RIGHT inguinal canal, 8.2 x 8.1 by greater
than 6.4 cm, consistent with hematoma. Two adjacent fluid
collections containing tiny foci of gas are identified in the RIGHT
inguinal canal, may represent the 2 plugs utilize for hernia repair.
Surrounding infiltrative changes. Hemorrhage extends from below the
plugs into the inguinal canals. Tiny foci of gas are seen within the
RIGHT scrotum and within the RIGHT scrotal wall. No recurrent
hernia.

Musculoskeletal: Osseous structures unremarkable.
IMPRESSION: 8.2 x 8.1 x 6.4 cm diameter hematoma in RIGHT scrotal sac extending
into distal inguinal canal. Testes are not delineated; if there is
clinical concern for testicular torsion, ultrasound would be
recommended.

Two adjacent fluid collections containing tiny foci of gas are
identified in the RIGHT inguinal canal, may represent the 2 plugs
utilized for hernia repair though cannot exclude other postoperative
collections; if patient develops fever or leukocytosis and there is
clinical concern for infection, would recommend repeat CT to exclude
abscess and/or fasciitis.

No recurrent hernia.

Small LEFT inguinal and umbilical hernias containing fat.

Bibasilar atelectasis and minimal pleural effusions.

Aortic Atherosclerosis ([id number].V).

## 2022-06-16 DIAGNOSIS — R059 Cough, unspecified: Secondary | ICD-10-CM | POA: Diagnosis not present

## 2022-06-16 DIAGNOSIS — R0981 Nasal congestion: Secondary | ICD-10-CM | POA: Diagnosis not present

## 2022-06-16 DIAGNOSIS — J069 Acute upper respiratory infection, unspecified: Secondary | ICD-10-CM | POA: Diagnosis not present

## 2022-09-16 DIAGNOSIS — J4540 Moderate persistent asthma, uncomplicated: Secondary | ICD-10-CM | POA: Diagnosis not present

## 2022-09-16 DIAGNOSIS — E039 Hypothyroidism, unspecified: Secondary | ICD-10-CM | POA: Diagnosis not present

## 2022-09-22 DIAGNOSIS — R7303 Prediabetes: Secondary | ICD-10-CM | POA: Diagnosis not present

## 2022-09-22 DIAGNOSIS — E782 Mixed hyperlipidemia: Secondary | ICD-10-CM | POA: Diagnosis not present

## 2022-09-22 DIAGNOSIS — Z0000 Encounter for general adult medical examination without abnormal findings: Secondary | ICD-10-CM | POA: Diagnosis not present

## 2022-09-22 DIAGNOSIS — R197 Diarrhea, unspecified: Secondary | ICD-10-CM | POA: Diagnosis not present

## 2022-09-22 DIAGNOSIS — I10 Essential (primary) hypertension: Secondary | ICD-10-CM | POA: Diagnosis not present

## 2022-09-22 DIAGNOSIS — J4540 Moderate persistent asthma, uncomplicated: Secondary | ICD-10-CM | POA: Diagnosis not present

## 2022-09-22 DIAGNOSIS — E039 Hypothyroidism, unspecified: Secondary | ICD-10-CM | POA: Diagnosis not present

## 2022-09-22 DIAGNOSIS — F32A Depression, unspecified: Secondary | ICD-10-CM | POA: Diagnosis not present

## 2022-09-22 DIAGNOSIS — K219 Gastro-esophageal reflux disease without esophagitis: Secondary | ICD-10-CM | POA: Diagnosis not present

## 2022-09-23 DIAGNOSIS — H40012 Open angle with borderline findings, low risk, left eye: Secondary | ICD-10-CM | POA: Diagnosis not present

## 2022-09-23 DIAGNOSIS — H40051 Ocular hypertension, right eye: Secondary | ICD-10-CM | POA: Diagnosis not present

## 2022-12-07 DIAGNOSIS — D224 Melanocytic nevi of scalp and neck: Secondary | ICD-10-CM | POA: Diagnosis not present

## 2022-12-07 DIAGNOSIS — D485 Neoplasm of uncertain behavior of skin: Secondary | ICD-10-CM | POA: Diagnosis not present

## 2022-12-07 DIAGNOSIS — D225 Melanocytic nevi of trunk: Secondary | ICD-10-CM | POA: Diagnosis not present

## 2022-12-07 DIAGNOSIS — L821 Other seborrheic keratosis: Secondary | ICD-10-CM | POA: Diagnosis not present

## 2022-12-07 DIAGNOSIS — S30861A Insect bite (nonvenomous) of abdominal wall, initial encounter: Secondary | ICD-10-CM | POA: Diagnosis not present

## 2022-12-13 ENCOUNTER — Encounter: Payer: Self-pay | Admitting: Internal Medicine

## 2022-12-20 DIAGNOSIS — D485 Neoplasm of uncertain behavior of skin: Secondary | ICD-10-CM | POA: Diagnosis not present

## 2022-12-20 DIAGNOSIS — D224 Melanocytic nevi of scalp and neck: Secondary | ICD-10-CM | POA: Diagnosis not present

## 2022-12-22 ENCOUNTER — Ambulatory Visit
Admission: EM | Admit: 2022-12-22 | Discharge: 2022-12-22 | Disposition: A | Discharge status: Home / Self Care | Payer: Medicare Other | Attending: Nurse Practitioner | Admitting: Nurse Practitioner

## 2022-12-22 ENCOUNTER — Encounter: Payer: Self-pay | Admitting: Emergency Medicine

## 2022-12-22 ENCOUNTER — Other Ambulatory Visit: Payer: Self-pay

## 2022-12-22 DIAGNOSIS — J019 Acute sinusitis, unspecified: Secondary | ICD-10-CM | POA: Diagnosis not present

## 2022-12-22 DIAGNOSIS — B9689 Other specified bacterial agents as the cause of diseases classified elsewhere: Secondary | ICD-10-CM

## 2022-12-22 MED ORDER — BENZONATATE 100 MG PO CAPS: 100.0000 mg | ORAL_CAPSULE | Freq: Three times a day (TID) | ORAL | 0 refills | Status: DC | PRN

## 2022-12-22 MED ORDER — PROMETHAZINE-DM 6.25-15 MG/5ML PO SYRP: 2.5000 mL | ORAL_SOLUTION | Freq: Every evening | ORAL | 0 refills | Status: AC | PRN

## 2022-12-22 MED ORDER — AMOXICILLIN-POT CLAVULANATE 875-125 MG PO TABS: 1.0000 | ORAL_TABLET | Freq: Two times a day (BID) | ORAL | 0 refills | Status: AC

## 2022-12-22 NOTE — Discharge Instructions (Addendum)
You have a bacterial sinus infection .  Take the Augmentin as prescribed to treat it.  Symptoms should improve over the next week to 10 days.  If you develop chest pain or shortness of breath, go to the emergency room.  Some things that can make you feel better are: - Increased rest - Increasing fluid with water/sugar free electrolytes - Acetaminophen and ibuprofen as needed for fever/pain - Salt water gargling, chloraseptic spray and throat lozenges - OTC guaifenesin (Mucinex) 600 mg twice daily for congested - Saline sinus flushes or a neti pot - Humidifying the air -Tessalon Perles during the day as needed for dry cough and cough syrup at nighttime as needed for dry cough

## 2022-12-22 NOTE — ED Triage Notes (Signed)
Pt reports runny nose and cough x1 week. Pt reports intermittent "scratchy" throat,fevers, and fatigue. Home covid test negative today.

## 2022-12-22 NOTE — ED Provider Notes (Signed)
[facility]    CSN: [id number] Arrival date & time: 12/22/22  1746      History   Chief Complaint Chief Complaint  Patient presents with   Cough    HPI Jeffrey Gordon is a 67 y.o. male.   Patient presents today for 1 week of low-grade fever, chills, congested cough, shortness of breath after coughing, soreness on the right side when he coughs and takes a deep breath, chest and nasal congestion, runny nose, decreased appetite, and fatigue.  Also having sinus pressure in his cheeks above his eyes, and headache.  Reports he had a sore throat at first, however this is better now.  No chest pain or tightness, wheezing, ear pain, abdominal pain, nausea/vomiting, or diarrhea.  No known sick contacts.  Patient has been taking Tylenol, ibuprofen, Mucinex, and cough syrup without much benefit.  Patient reports medical history significant for asthma.  Reports he takes Symbicort daily.  Patient denies antibiotic use in the past 90 days.  He denies allergies to antibiotic therapy.    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/21/2020   Urinary retention 04/21/2020   Postoperative anemia due to acute blood loss 04/14/2020   HTN (hypertension) 04/14/2020   Depression 04/14/2020   Hyponatremia 04/13/2020   Right inguinal hernia 03/17/2020   DETACHMENT, RETINAL NOS 02/05/2009   PREMATURE EJACULATION 10/21/2008   ERECTILE DYSFUNCTION 11/06/2006   HYPERLIPIDEMIA 07/04/2006   DEPRESSION 07/04/2006   HYPERTENSION 07/04/2006   ALLERGIC RHINITIS 07/04/2006   ESOPHAGITIS NOS 07/04/2006   GERD  07/04/2006   IBS 07/04/2006   BENIGN PROSTATIC HYPERTROPHY 07/04/2006   DYSPHAGIA, UNSPECIFIED 07/04/2006   FRACTURE, FINGER 07/04/2006    Past Surgical History:  Procedure Laterality Date   CATARACT EXTRACTION W/ INTRAOCULAR LENS  IMPLANT, BILATERAL     2002 (left); 2012 (right)   COLONOSCOPY     EYE SURGERY     INGUINAL HERNIA REPAIR  1989   left   INGUINAL HERNIA REPAIR Right 04/10/2020   Procedure: HERNIA REPAIR INGUINAL ADULT;  Surgeon: Lucretia Roers, MD;  Location: [facility];  Service: General;  Laterality: Right;   PARS PLANA VITRECTOMY  10/20/11   "took out the old lens implant and put a new one in"   PARS PLANA VITRECTOMY  10/20/2011   Procedure: PARS PLANA VITRECTOMY WITH 25G REMOVAL/SUTURE INTRAOCULAR LENS;  Surgeon: Sherrie George, MD;  Location: [facility] OR;  Service: Ophthalmology;  Laterality: Left;   RETINAL DETACHMENT SURGERY  2002   left   RETINAL DETACHMENT SURGERY  2003   "repair; left"       Home Medications    Prior to Admission medications   Medication Sig Start Date End Date Taking? Authorizing Provider  amoxicillin-clavulanate (AUGMENTIN) 875-125 MG tablet Take 1 tablet by mouth 2 (two) times daily for 7 days. 12/22/22 12/29/22 Yes Valentino Nose, NP  benzonatate (TESSALON) 100 MG capsule Take 1 capsule (100 mg total) by mouth 3 (three) times daily as needed for cough. Do  not take with alcohol or while driving or operating heavy machinery.  May cause drowsiness. 12/22/22  Yes Valentino Nose, NP  promethazine-dextromethorphan (PROMETHAZINE-DM) 6.25-15 MG/5ML syrup Take 2.5 mLs by mouth at bedtime as needed for cough. 12/22/22  Yes Valentino Nose, NP  aspirin EC 81 MG tablet Take 1 tablet (81 mg total) by mouth daily with breakfast. 04/17/20   Emokpae, Courage, MD  budesonide-formoterol (SYMBICORT) 160-4.5 MCG/ACT inhaler INHALE 2 PUFFS INTO THE LUNGS TWICE DAILY 01/13/22   Mannam, Praveen, MD  CO ENZYME Q-10 PO Take 1 capsule by mouth daily.     [provider]  diazepam (VALIUM) 2 MG tablet Take 1 tablet (2 mg total) by mouth every 6 (six) hours as needed for anxiety. Patient taking differently: Take 2 mg by mouth every 6 (six) hours as needed. For sleep 04/23/20   Lucretia Roers, MD  FLUoxetine (PROZAC) 40 MG capsule Take 40 mg by mouth daily.    [provider]  latanoprost (XALATAN) 0.005 % ophthalmic solution Place 1 drop into both eyes at bedtime.  02/20/20   [provider]  levothyroxine (SYNTHROID, LEVOTHROID) 88 MCG tablet Take 88 mcg by mouth daily before breakfast.     [provider]  losartan (COZAAR) 100 MG tablet Take 50 mg by mouth daily. 06/02/20   [provider]  Multiple Vitamins-Minerals (PRESERVISION AREDS 2 PO) Take 1 capsule by mouth in the morning and at bedtime.    [provider]  omeprazole (PRILOSEC) 20 MG capsule Take 20 mg by mouth daily.    [provider]  rosuvastatin (CRESTOR) 10 MG tablet Take 10 mg by mouth daily.    [provider]  sildenafil (VIAGRA) 100 MG tablet Take 100 mg by mouth daily as needed.     [provider]  tamsulosin (FLOMAX) 0.4 MG CAPS capsule TAKE 1 CAPSULE(0.4 MG) BY MOUTH TWICE DAILY 01/19/22   McKenzie, Mardene Celeste, MD    Family History Family History  Problem Relation Age of Onset   Colon cancer Father    Colon cancer Paternal Aunt    Esophageal cancer Neg Hx    Rectal cancer Neg Hx    Stomach cancer Neg Hx     Social History Social History   Tobacco Use   Smoking status: Never   Smokeless tobacco: Never   Tobacco comments:    "tried smoking briefly as a teen"  Substance Use Topics   Alcohol use: Not Currently    Comment: 10/20/11 "last alcohol ` 2010"   Drug use: No     Allergies   Durezol [difluprednate] and Advair diskus [fluticasone-salmeterol]   Review of Systems Review of Systems Per HPI  Physical Exam Triage Vital Signs ED Triage Vitals  Enc Vitals Group     BP 12/22/22  1820 (!) 151/93     Pulse Rate 12/22/22 1820 96     Resp 12/22/22 1820 20     Temp 12/22/22 1820 100.2 F (37.9 C)     Temp Source 12/22/22 1820 Oral     SpO2 12/22/22 1820 91 %     Weight --      Height --      Head Circumference --      Peak Flow --      Pain Score 12/22/22 1817 1     Pain Loc --      Pain Edu? --      Excl. in GC? --    No  data found.  Updated Vital Signs BP (!) 151/93 (BP Location: Right Arm)   Pulse 96   Temp 100.2 F (37.9 C) (Oral) Comment: last dose of tylenol at 1600.  Resp 20   SpO2 91%   Oxygen Saturation recheck: 94% on Room air  Visual Acuity Right Eye Distance:   Left Eye Distance:   Bilateral Distance:    Right Eye Near:   Left Eye Near:    Bilateral Near:     Physical Exam Vitals and nursing note reviewed.  Constitutional:      General: He is not in acute distress.    Appearance: Normal appearance. He is not ill-appearing or toxic-appearing.  HENT:     Head: Normocephalic and atraumatic.     Right Ear: Tympanic membrane, ear canal and external ear normal.     Left Ear: Tympanic membrane, ear canal and external ear normal.     Nose: Congestion present. No rhinorrhea.     Right Sinus: Maxillary sinus tenderness and frontal sinus tenderness present.     Left Sinus: Maxillary sinus tenderness and frontal sinus tenderness present.     Mouth/Throat:     Mouth: Mucous membranes are moist.     Pharynx: Oropharynx is clear. Posterior oropharyngeal erythema present. No oropharyngeal exudate.  Eyes:     General: No scleral icterus.    Extraocular Movements: Extraocular movements intact.  Cardiovascular:     Rate and Rhythm: Normal rate and regular rhythm.  Pulmonary:     Effort: Pulmonary effort is normal. No respiratory distress.     Breath sounds: Normal breath sounds. No wheezing, rhonchi or rales.     Comments: Patient talking in complete sentences with accessory muscle use Abdominal:     General: Abdomen is flat. Bowel sounds  are normal. There is no distension.     Palpations: Abdomen is soft.     Tenderness: There is no abdominal tenderness.  Musculoskeletal:     Cervical back: Normal range of motion and neck supple.  Lymphadenopathy:     Cervical: No cervical adenopathy.  Skin:    General: Skin is warm and dry.     Coloration: Skin is not jaundiced or pale.     Findings: No erythema or rash.  Neurological:     Mental Status: He is alert and oriented to person, place, and time.  Psychiatric:        Behavior: Behavior is cooperative.      UC Treatments / Results  Labs (all labs ordered are listed, but only abnormal results are displayed) Labs Reviewed - No data to display  EKG   Radiology No results found.  Procedures Procedures (including critical care time)  Medications Ordered in UC Medications - No data to display  Initial Impression / Assessment and Plan / UC Course  I have reviewed the triage vital signs and the nursing notes.  Pertinent labs & imaging results that were available during my care of the patient were reviewed by me and considered in my medical decision making (see chart for details).   Patient is well-appearing, not tachycardic, not tachypneic, oxygenating well on room air.  Patient has a low-grade fever and is mildly hypertensive in urgent care today.  1. Acute bacterial sinusitis Treat with Augmentin twice daily for 7 days Supportive care discussed with patient Also start cough suppressant medication ER and return precautions discussed with patient  The patient was given the opportunity to ask questions.  All questions answered to their satisfaction.  The patient is in agreement to this plan.    Final Clinical Impressions(s) / UC Diagnoses   Final diagnoses:  Acute bacterial sinusitis     Discharge Instructions      You have a bacterial sinus infection .  Take the Augmentin as prescribed to treat it.  Symptoms should improve over the next week to 10 days.   If you develop chest pain or shortness of breath, go to the emergency room.  Some things that can make you feel better are: - Increased rest - Increasing fluid with water/sugar free electrolytes - Acetaminophen and ibuprofen as needed for fever/pain - Salt water gargling, chloraseptic spray and throat lozenges - OTC guaifenesin (Mucinex) 600 mg twice daily for congested - Saline sinus flushes or a neti pot - Humidifying the air -Tessalon Perles during the day as needed for dry cough and cough syrup at nighttime as needed for dry cough     ED Prescriptions     Medication Sig Dispense Auth. Provider   amoxicillin-clavulanate (AUGMENTIN) 875-125 MG tablet Take 1 tablet by mouth 2 (two) times daily for 7 days. 14 tablet Cathlean Marseilles A, NP   benzonatate (TESSALON) 100 MG capsule Take 1 capsule (100 mg total) by mouth 3 (three) times daily as needed for cough. Do not take with alcohol or while driving or operating heavy machinery.  May cause drowsiness. 21 capsule Cathlean Marseilles A, NP   promethazine-dextromethorphan (PROMETHAZINE-DM) 6.25-15 MG/5ML syrup Take 2.5 mLs by mouth at bedtime as needed for cough. 118 mL Valentino Nose, NP      PDMP not reviewed this encounter.   Valentino Nose, NP 12/22/22 1904

## 2023-01-11 ENCOUNTER — Encounter: Payer: Self-pay | Admitting: Internal Medicine

## 2023-01-11 ENCOUNTER — Ambulatory Visit (AMBULATORY_SURGERY_CENTER): Payer: Medicare Other

## 2023-01-11 VITALS — Ht 75.0 in | Wt 196.0 lb

## 2023-01-11 DIAGNOSIS — Z800 Family history of malignant neoplasm of digestive organs: Secondary | ICD-10-CM

## 2023-01-11 DIAGNOSIS — Z86010 Personal history of colonic polyps: Secondary | ICD-10-CM

## 2023-01-11 MED ORDER — NA SULFATE-K SULFATE-MG SULF 17.5-3.13-1.6 GM/177ML PO SOLN: 1.0000 | Freq: Once | ORAL | 0 refills | Status: AC

## 2023-01-11 NOTE — Progress Notes (Signed)
No egg or soy allergy known to patient  No issues known to pt with past sedation with any surgeries or procedures Patient denies ever being told they had issues or difficulty with intubation  No FH of Malignant Hyperthermia Pt is not on diet pills Pt is not on  home 02  Pt is not on blood thinners  Pt denies issues with constipation  No A fib or A flutter Have any cardiac testing pending--no Pt instructed to use [email or web address] or GoodRx for a price reduction on prep  Can ambulate with out assistance 

## 2023-01-20 ENCOUNTER — Ambulatory Visit: Payer: Medicare Other | Admitting: Urology

## 2023-01-23 ENCOUNTER — Ambulatory Visit: Payer: Medicare Other | Admitting: Urology

## 2023-01-23 ENCOUNTER — Encounter: Payer: Self-pay | Admitting: Urology

## 2023-01-23 VITALS — BP 168/84 | HR 85

## 2023-01-23 DIAGNOSIS — R339 Retention of urine, unspecified: Secondary | ICD-10-CM

## 2023-01-23 DIAGNOSIS — N401 Enlarged prostate with lower urinary tract symptoms: Secondary | ICD-10-CM | POA: Diagnosis not present

## 2023-01-23 LAB — URINALYSIS, ROUTINE W REFLEX MICROSCOPIC
Bilirubin, UA: NEGATIVE
Glucose, UA: NEGATIVE
Ketones, UA: NEGATIVE
Leukocytes,UA: NEGATIVE
Nitrite, UA: NEGATIVE
Protein,UA: NEGATIVE
RBC, UA: NEGATIVE
Specific Gravity, UA: 1.01 (ref 1.005–1.030)
Urobilinogen, Ur: 0.2 mg/dL (ref 0.2–1.0)
pH, UA: 7 (ref 5.0–7.5)

## 2023-01-23 MED ORDER — TAMSULOSIN HCL 0.4 MG PO CAPS: ORAL_CAPSULE | ORAL | 3 refills | Status: DC

## 2023-01-23 NOTE — Patient Instructions (Signed)

## 2023-01-23 NOTE — Progress Notes (Unsigned)
01/23/2023 10:38 AM   Jeffrey Gordon Sep 19, 1955 [id number]  Referring provider: Benita Stabile, MD [address] [place],  [place] [address]  Followup incomplete emptying   HPI: Jeffrey Gordon is a 67yo here for followup BPH and incomplete emptying. IPSS 13 QOL 3 on flomax BID. PVR 241cc. Uirne stream is fair. He has urinary frequency every 2-3 hours. He has nocturia 2-4x. No straining to urinate.    PMH: Past Medical History:  Diagnosis Date   Acid reflux    Allergy    Anxiety    Asthma    Blurred vision, left eye    Cataract    Depression    GERD (gastroesophageal reflux disease)    High cholesterol    Hypertension    Hypothyroidism    Hypothyroidism     Surgical History: Past Surgical History:  Procedure Laterality Date   CATARACT EXTRACTION W/ INTRAOCULAR LENS  IMPLANT, BILATERAL     2002 (left); 2012 (right)   COLONOSCOPY     EYE SURGERY     INGUINAL HERNIA REPAIR  1989   left   INGUINAL HERNIA REPAIR Right 04/10/2020   Procedure: HERNIA REPAIR INGUINAL ADULT;  Surgeon: Lucretia Roers, MD;  Location: [facility];  Service: General;  Laterality: Right;   INGUINAL HERNIA REPAIR Right 2021   PARS PLANA VITRECTOMY  10/20/2011   "took out the old lens implant and put a new one in"   PARS PLANA VITRECTOMY  10/20/2011   Procedure: PARS PLANA VITRECTOMY WITH 25G REMOVAL/SUTURE INTRAOCULAR LENS;  Surgeon: Sherrie George, MD;  Location: [facility];  Service: Ophthalmology;  Laterality: Left;   RETINAL DETACHMENT SURGERY  2002   left   RETINAL DETACHMENT SURGERY  2003   "repair; left"    Home Medications:  Allergies as of 01/23/2023       Reactions   Durezol [difluprednate] Other (See Comments)   "Pressure increased in my eye"   Advair Diskus [fluticasone-salmeterol] Other (See Comments)   Issues sleeping        Medication List        Accurate as of January 23, 2023 10:38 AM. If you have any questions, ask your nurse or doctor.          aspirin EC 81 MG  tablet Take 1 tablet (81 mg total) by mouth daily with breakfast.   benzonatate 100 MG capsule Commonly known as: TESSALON Take 1 capsule (100 mg total) by mouth 3 (three) times daily as needed for cough. Do not take with alcohol or while driving or operating heavy machinery.  May cause drowsiness.   CO ENZYME Q-10 PO Take 1 capsule by mouth daily.   diazepam 2 MG tablet Commonly known as: Valium Take 1 tablet (2 mg total) by mouth every 6 (six) hours as needed for anxiety.   FLUoxetine 40 MG capsule Commonly known as: PROZAC Take 40 mg by mouth daily.   latanoprost 0.005 % ophthalmic solution Commonly known as: XALATAN Place 1 drop into both eyes at bedtime.   levothyroxine 88 MCG tablet Commonly known as: SYNTHROID Take 88 mcg by mouth daily before breakfast.   levothyroxine 100 MCG tablet Commonly known as: SYNTHROID Take 100 mcg by mouth daily.   losartan 50 MG tablet Commonly known as: COZAAR Take 50 mg by mouth daily.   Melatonin 2.5 MG Chew Chew by mouth.   omeprazole 20 MG capsule Commonly known as: PRILOSEC Take 20 mg by mouth daily.   PRESERVISION  AREDS 2 PO Take 1 capsule by mouth in the morning and at bedtime.   promethazine-dextromethorphan 6.25-15 MG/5ML syrup Commonly known as: PROMETHAZINE-DM Take 2.5 mLs by mouth at bedtime as needed for cough.   rosuvastatin 10 MG tablet Commonly known as: CRESTOR Take 10 mg by mouth daily.   sildenafil 100 MG tablet Commonly known as: VIAGRA Take 100 mg by mouth daily as needed.   Symbicort 160-4.5 MCG/ACT inhaler Generic drug: budesonide-formoterol INHALE 2 PUFFS INTO THE LUNGS TWICE DAILY   tamsulosin 0.4 MG Caps capsule Commonly known as: FLOMAX TAKE 1 CAPSULE(0.4 MG) BY MOUTH TWICE DAILY        Allergies:  Allergies  Allergen Reactions   Durezol [Difluprednate] Other (See Comments)    "Pressure increased in my eye"   Advair Diskus [Fluticasone-Salmeterol] Other (See Comments)    Issues  sleeping    Family History: Family History  Problem Relation Age of Onset   Colon cancer Father    Colon cancer Paternal Aunt    Esophageal cancer Neg Hx    Rectal cancer Neg Hx    Stomach cancer Neg Hx     Social History:  reports that he has never smoked. He has never used smokeless tobacco. He reports that he does not currently use alcohol. He reports that he does not use drugs.  ROS: All other review of systems were reviewed and are negative except what is noted above in HPI  Physical Exam: BP (!) 168/84   Pulse 85   Constitutional:  Alert and oriented, No acute distress. HEENT: [place] AT, moist mucus membranes.  Trachea midline, no masses. Cardiovascular: No clubbing, cyanosis, or edema. Respiratory: Normal respiratory effort, no increased work of breathing. GI: Abdomen is soft, nontender, nondistended, no abdominal masses GU: No CVA tenderness.  Lymph: No cervical or inguinal lymphadenopathy. Skin: No rashes, bruises or suspicious lesions. Neurologic: Grossly intact, no focal deficits, moving all 4 extremities. Psychiatric: Normal mood and affect.  Laboratory Data: Lab Results  Component Value Date   WBC 11.7 (H) 04/16/2020   HGB 8.8 (L) 04/16/2020   HCT 26.2 (L) 04/16/2020   MCV 89.4 04/16/2020   PLT 313 04/16/2020    Lab Results  Component Value Date   CREATININE 0.63 04/17/2020    Lab Results  Component Value Date   PSA Dr. York Cerise follows 04/24/2008   PSA normal (0.58) 10/28/2006    No results found for: "TESTOSTERONE"  No results found for: "HGBA1C"  Urinalysis    Component Value Date/Time   COLORURINE YELLOW 04/13/2020 1120   APPEARANCEUR Clear 01/19/2022 1031   LABSPEC 1.024 04/13/2020 1120   PHURINE 6.0 04/13/2020 1120   GLUCOSEU Negative 01/19/2022 1031   HGBUR NEGATIVE 04/13/2020 1120   HGBUR negative 04/24/2008 0820   BILIRUBINUR Negative 01/19/2022 1031   KETONESUR negative 04/20/2020 1836   KETONESUR 20 (A) 04/13/2020 1120    PROTEINUR Negative 01/19/2022 1031   PROTEINUR 30 (A) 04/13/2020 1120   UROBILINOGEN 4.0 (A) 04/20/2020 1836   UROBILINOGEN 0.2 04/24/2008 0820   NITRITE Negative 01/19/2022 1031   NITRITE NEGATIVE 04/13/2020 1120   LEUKOCYTESUR Negative 01/19/2022 1031   LEUKOCYTESUR NEGATIVE 04/13/2020 1120    Lab Results  Component Value Date   LABMICR Comment 01/19/2022   BACTERIA NONE SEEN 04/13/2020    Pertinent Imaging:  No results found for this or any previous visit.  No results found for this or any previous visit.  No results found for this or any previous visit.  No  results found for this or any previous visit.  No results found for this or any previous visit.  No valid procedures specified. No results found for this or any previous visit.  No results found for this or any previous visit.   Assessment & Plan:    1. Urinary retention Continue flomax 0.4mg  BID - Urinalysis, Routine w reflex microscopic - BLADDER SCAN AMB NON-IMAGING   No follow-ups on file.  Wilkie Aye, MD  [facility] [place]

## 2023-01-23 NOTE — Progress Notes (Unsigned)
post void residual=241

## 2023-02-09 ENCOUNTER — Encounter: Payer: Medicare Other | Admitting: Internal Medicine

## 2023-02-10 ENCOUNTER — Encounter: Payer: Self-pay | Admitting: Internal Medicine

## 2023-02-10 ENCOUNTER — Ambulatory Visit (AMBULATORY_SURGERY_CENTER): Payer: Medicare Other | Admitting: Internal Medicine

## 2023-02-10 VITALS — BP 134/69 | HR 66 | Temp 98.0°F | Resp 9 | Ht 75.0 in | Wt 196.0 lb

## 2023-02-10 DIAGNOSIS — Z86010 Personal history of colonic polyps: Secondary | ICD-10-CM | POA: Diagnosis not present

## 2023-02-10 DIAGNOSIS — E039 Hypothyroidism, unspecified: Secondary | ICD-10-CM | POA: Diagnosis not present

## 2023-02-10 DIAGNOSIS — I10 Essential (primary) hypertension: Secondary | ICD-10-CM | POA: Diagnosis not present

## 2023-02-10 DIAGNOSIS — E7800 Pure hypercholesterolemia, unspecified: Secondary | ICD-10-CM | POA: Diagnosis not present

## 2023-02-10 DIAGNOSIS — Z09 Encounter for follow-up examination after completed treatment for conditions other than malignant neoplasm: Secondary | ICD-10-CM

## 2023-02-10 DIAGNOSIS — Z800 Family history of malignant neoplasm of digestive organs: Secondary | ICD-10-CM

## 2023-02-10 HISTORY — PX: COLONOSCOPY WITH PROPOFOL: SHX5780

## 2023-02-10 MED ORDER — SODIUM CHLORIDE 0.9 % IV SOLN
500.0000 mL | Freq: Once | INTRAVENOUS | Status: DC
Start: 2023-02-10 — End: 2023-02-10

## 2023-02-10 NOTE — Op Note (Signed)
[facility] Patient Name: Jeffrey Gordon Procedure Date: 02/10/2023 10:29 AM MRN: [id number] Endoscopist: Wilhemina Bonito. Marina Goodell , MD, [phone] Age: 67 Referring MD:  Date of Birth: April 03, 1956 Gender: Male Account #: [id number] Procedure:                Colonoscopy Indications:              High risk colon cancer surveillance: Personal                            history of colonic polyps, High risk colon cancer                            surveillance: Personal history of non-advanced                            adenoma. Family history of colon cancer in father                            less than age 89. Previous examinations 2003, 2008,                            2014, 2019 Medicines:                Monitored Anesthesia Care Procedure:                Pre-Anesthesia Assessment:                           - Prior to the procedure, a History and Physical                            was performed, and patient medications and                            allergies were reviewed. The patient's tolerance of                            previous anesthesia was also reviewed. The risks                            and benefits of the procedure and the sedation                            options and risks were discussed with the patient.                            All questions were answered, and informed consent                            was obtained. Prior Anticoagulants: The patient has                            taken no anticoagulant or antiplatelet agents. ASA  Grade Assessment: II - A patient with mild systemic                            disease. After reviewing the risks and benefits,                            the patient was deemed in satisfactory condition to                            undergo the procedure.                           After obtaining informed consent, the colonoscope                            was passed under direct vision. Throughout the                             procedure, the patient's blood pressure, pulse, and                            oxygen saturations were monitored continuously. The                            Olympus Scope SN: [id number] was introduced through                            the anus and advanced to the the cecum, identified                            by appendiceal orifice and ileocecal valve. The                            ileocecal valve, appendiceal orifice, and rectum                            were photographed. The quality of the bowel                            preparation was adequate to identify polyps greater                            than 5 mm in size. The colonoscopy was performed                            without difficulty. The patient tolerated the                            procedure well. The bowel preparation used was                            SUPREP via split dose instruction. Scope In: 10:44:16 AM Scope Out: 11:05:37 AM Scope Withdrawal Time: 0 hours 10 minutes 50 seconds  Total Procedure Duration: 0 hours 21 minutes 21 seconds  Findings:                 A few diverticula were found in the sigmoid colon.                            The colon was REDUNDANT.                           The exam was otherwise without abnormality on                            direct and retroflexion views. Complications:            No immediate complications. Estimated blood loss:                            None. Estimated Blood Loss:     Estimated blood loss: none. Impression:               - Diverticulosis in the sigmoid colon.                           - The examination was otherwise normal on direct                            and retroflexion views.                           - REDUNDANT COLON. Recommendation:           - Repeat colonoscopy in 5 years for surveillance                            (REQUIRES EXTENSIVE PREP).                           - Patient has a contact number available for                             emergencies. The signs and symptoms of potential                            delayed complications were discussed with the                            patient. Return to normal activities tomorrow.                            Written discharge instructions were provided to the                            patient.                           - Resume previous diet.                           -  Continue present medications. Wilhemina Bonito. Marina Goodell, MD 02/10/2023 11:14:29 AM This report has been signed electronically.

## 2023-02-10 NOTE — Progress Notes (Signed)
Pt's states no medical or surgical changes since previsit or office visit. 

## 2023-02-10 NOTE — Progress Notes (Signed)
HISTORY OF PRESENT ILLNESS:  Jeffrey Gordon is a 67 y.o. male with a family history of colon cancer and personal history of adenomatous colon polyp.  He presents today for surveillance colonoscopy.  REVIEW OF SYSTEMS:  All non-GI ROS negative except for  Past Medical History:  Diagnosis Date   Acid reflux    Allergy    Anxiety    Asthma    Blurred vision, left eye    Cataract    Depression    GERD (gastroesophageal reflux disease)    High cholesterol    Hypertension    Hypothyroidism    Hypothyroidism     Past Surgical History:  Procedure Laterality Date   CATARACT EXTRACTION W/ INTRAOCULAR LENS  IMPLANT, BILATERAL     2002 (left); 2012 (right)   COLONOSCOPY     EYE SURGERY     INGUINAL HERNIA REPAIR  1989   left   INGUINAL HERNIA REPAIR Right 04/10/2020   Procedure: HERNIA REPAIR INGUINAL ADULT;  Surgeon: Lucretia Roers, MD;  Location: [facility];  Service: General;  Laterality: Right;   INGUINAL HERNIA REPAIR Right 2021   PARS PLANA VITRECTOMY  10/20/2011   "took out the old lens implant and put a new one in"   PARS PLANA VITRECTOMY  10/20/2011   Procedure: PARS PLANA VITRECTOMY WITH 25G REMOVAL/SUTURE INTRAOCULAR LENS;  Surgeon: Sherrie George, MD;  Location: [facility] OR;  Service: Ophthalmology;  Laterality: Left;   RETINAL DETACHMENT SURGERY  2002   left   RETINAL DETACHMENT SURGERY  2003   "repair; left"    Social History Jeffrey Gordon  reports that he has never smoked. He has never used smokeless tobacco. He reports that he does not currently use alcohol. He reports that he does not use drugs.  family history includes Colon cancer in his father and paternal aunt.  Allergies  Allergen Reactions   Durezol [Difluprednate] Other (See Comments)    "Pressure increased in my eye"   Advair Diskus [Fluticasone-Salmeterol] Other (See Comments)    Issues sleeping       PHYSICAL EXAMINATION: Vital signs: BP (!) 143/90   Pulse 68   Temp 98 F (36.7 C)   Ht  6\' 3"  (1.905 m)   Wt 196 lb (88.9 kg)   SpO2 98%   BMI 24.50 kg/m  General: Well-developed, well-nourished, no acute distress HEENT: Sclerae are anicteric, conjunctiva pink. Oral mucosa intact Lungs: Clear Heart: Regular Abdomen: soft, nontender, nondistended, no obvious ascites, no peritoneal signs, normal bowel sounds. No organomegaly. Extremities: No edema Psychiatric: alert and oriented x3. Cooperative     ASSESSMENT:  History of colon cancer Personal history of adenomatous colon polyp   PLAN:   Surveillance colonoscopy

## 2023-02-10 NOTE — Progress Notes (Signed)
Uneventful anesthetic. Report to pacu rn. Vss. Care resumed by rn. 

## 2023-02-10 NOTE — Patient Instructions (Signed)
YOU HAD AN ENDOSCOPIC PROCEDURE TODAY AT THE [facility]:   Refer to the procedure report that was given to you for any specific questions about what was found during the examination.  If the procedure report does not answer your questions, please call your gastroenterologist to clarify.  If you requested that your care partner not be given the details of your procedure findings, then the procedure report has been included in a sealed envelope for you to review at your convenience later.  YOU SHOULD EXPECT: Some feelings of bloating in the abdomen. Passage of more gas than usual.  Walking can help get rid of the air that was put into your GI tract during the procedure and reduce the bloating. If you had a lower endoscopy (such as a colonoscopy or flexible sigmoidoscopy) you may notice spotting of blood in your stool or on the toilet paper. If you underwent a bowel prep for your procedure, you may not have a normal bowel movement for a few days.  Please Note:  You might notice some irritation and congestion in your nose or some drainage.  This is from the oxygen used during your procedure.  There is no need for concern and it should clear up in a day or so.  SYMPTOMS TO REPORT IMMEDIATELY:  Following lower endoscopy (colonoscopy or flexible sigmoidoscopy):  Excessive amounts of blood in the stool  Significant tenderness or worsening of abdominal pains  Swelling of the abdomen that is new, acute  Fever of 100F or higher  For urgent or emergent issues, a gastroenterologist can be reached at any hour by calling [phone]. Do not use MyChart messaging for urgent concerns.    DIET:  We do recommend a small meal at first, but then you may proceed to your regular diet.  Drink plenty of fluids but you should avoid alcoholic beverages for 24 hours.  ACTIVITY:  You should plan to take it easy for the rest of today and you should NOT DRIVE or use heavy machinery until tomorrow (because of  the sedation medicines used during the test).    FOLLOW UP: Our staff will call the number listed on your records the next business day following your procedure.  We will call around 7:15- 8:00 am to check on you and address any questions or concerns that you may have regarding the information given to you following your procedure. If we do not reach you, we will leave a message.      SIGNATURES/CONFIDENTIALITY: You and/or your care partner have signed paperwork which will be entered into your electronic medical record.  These signatures attest to the fact that that the information above on your After Visit Summary has been reviewed and is understood.  Full responsibility of the confidentiality of this discharge information lies with you and/or your care-partner. 

## 2023-02-13 ENCOUNTER — Telehealth: Payer: Self-pay | Admitting: *Deleted

## 2023-02-13 NOTE — Telephone Encounter (Signed)
  Follow up Call-     02/10/2023   10:01 AM  Call back number  Post procedure Call Back phone  # [phone]  Permission to leave phone message Yes     Patient questions:  Do you have a fever, pain , or abdominal swelling? No. Pain Score  0 *  Have you tolerated food without any problems? Yes.    Have you been able to return to your normal activities? Yes.    Do you have any questions about your discharge instructions: Diet   No. Medications  No. Follow up visit  No.  Do you have questions or concerns about your Care? No.  Actions: * If pain score is 4 or above: No action needed, pain <4.

## 2023-02-16 ENCOUNTER — Other Ambulatory Visit: Payer: Self-pay | Admitting: Pulmonary Disease

## 2023-02-22 ENCOUNTER — Ambulatory Visit: Payer: Self-pay | Admitting: Surgery

## 2023-02-22 DIAGNOSIS — D234 Other benign neoplasm of skin of scalp and neck: Secondary | ICD-10-CM | POA: Diagnosis not present

## 2023-02-27 ENCOUNTER — Encounter (HOSPITAL_BASED_OUTPATIENT_CLINIC_OR_DEPARTMENT_OTHER): Payer: Self-pay | Admitting: Surgery

## 2023-02-27 NOTE — Progress Notes (Signed)
Spoke w/ via phone for pre-op interview--- pt Lab needs dos----    [employer], ekg           Lab results------ no COVID test -----patient states asymptomatic no test needed Arrive at ------- 0930 on 03-07-2023 NPO after MN NO Solid Food.  Clear liquids from MN until--- 0830 Med rec completed Medications to take morning of surgery ----- prozac, flomax, synthroid, prilosec, symbicort inhaler Diabetic medication ----- n/a Patient instructed no nail polish to be worn day of surgery Patient instructed to bring photo id and insurance card day of surgery Patient aware to have Driver (ride ) / caregiver  for 24 hours after surgery -- wife, Jeffrey Gordon Patient Special Instructions ----- n/a Pre-Op special Instructions ----- n/a Patient verbalized understanding of instructions that were given at this phone interview. Patient denies shortness of breath, chest pain, fever, cough at this phone interview.

## 2023-03-02 ENCOUNTER — Encounter (HOSPITAL_BASED_OUTPATIENT_CLINIC_OR_DEPARTMENT_OTHER): Payer: Self-pay | Admitting: Surgery

## 2023-03-02 DIAGNOSIS — D234 Other benign neoplasm of skin of scalp and neck: Secondary | ICD-10-CM | POA: Diagnosis present

## 2023-03-02 NOTE — H&P (Signed)
REFERRING PHYSICIAN: Fran Lowes.  PROVIDER: Zilphia Kozinski Myra Rude, MD  Chief Complaint: New Consultation (Dysplastic nevus on neck)  History of Present Illness:  Patient is referred by Dr. Suan Halter from dermatology for evaluation of a dysplastic nevus on the right neck. This has been excised twice in the office by [employer], PA. On both occasions there was a positive margin with atypical cells and dysplasia. Patient is now referred for definitive surgical excision. Patient has had no other such lesions. He denies any lymphadenopathy. Patient does take baby aspirin daily.  Review of Systems: A complete review of systems was obtained from the patient. I have reviewed this information and discussed as appropriate with the patient. See HPI as well for other ROS.  Review of Systems  Constitutional: Negative.  HENT: Negative.  Eyes: Negative.  Respiratory: Negative.  Cardiovascular: Negative.  Gastrointestinal: Negative.  Genitourinary: Negative.  Musculoskeletal: Negative.  Skin:  Abnormal nevus right postero-lateral neck  Neurological: Negative.  Endo/Heme/Allergies: Negative.  Psychiatric/Behavioral: Negative.    Medical History: Past Medical History:  Diagnosis Date  Anxiety  Asthma, unspecified asthma severity, unspecified whether complicated, unspecified whether persistent ([facility])  GERD (gastroesophageal reflux disease)  Hypertension   Patient Active Problem List  Diagnosis  Dysplastic nevus of neck   Past Surgical History:  Procedure Laterality Date  HERNIA REPAIR  left 1989 right 2021    Allergies  Allergen Reactions  Difluprednate Other (See Comments)  "Pressure increased in my eye"  Fluticasone Propion-Salmeterol Other (See Comments)  Issues sleeping   Current Outpatient Medications on File Prior to Visit  Medication Sig Dispense Refill  aspirin 81 MG EC tablet Take by mouth  latanoprost (XALATAN) 0.005 % ophthalmic solution  INSTILL 1 DROP BOTH EYES EVERY MORNING.  levothyroxine (SYNTHROID) 100 MCG tablet Take 100 mcg by mouth once daily  sodium, potassium, and magnesium (SUPREP) oral solution TAKE 1 KIT BY MOUTH ONCE FOR 1 DOSE. MAY USE GENERIC SUPREP.  SYMBICORT 160-4.5 mcg/actuation inhaler Inhale 2 inhalations into the lungs 2 (two) times daily  tamsulosin (FLOMAX) 0.4 mg capsule TAKE 1 CAPSULE(0.4 MG) BY MOUTH TWICE DAILY  acetaminophen (TYLENOL) 500 MG tablet Take 500 mg by mouth every 6 (six) hours as needed  antiox #8/om3/dha/epa/lut/zeax (PRESERVISION AREDS 2, OMEGA-3, ORAL) Take by mouth  co-enzyme Q-10, ubiquinone, 50 mg capsule Take by mouth once daily  diphenhydrAMINE (BENADRYL) 25 mg capsule Take 25 mg by mouth every 6 (six) hours as needed for Itching  FLUoxetine (PROZAC) 40 MG capsule Take 40 mg by mouth once daily  losartan (COZAAR) 100 MG tablet Take 100 mg by mouth once daily  magnesium sulfate 500 mg/mL oral solution Take by mouth  melatonin 2.5 mg Chew Take 5 mg by mouth  omeprazole (PRILOSEC) 20 MG DR capsule Take 20 mg by mouth once daily  rosuvastatin (CRESTOR) 10 MG tablet Take 10 mg by mouth once daily  sildenafiL (VIAGRA) 100 MG tablet Take 100 mg by mouth once daily as needed   No current facility-administered medications on file prior to visit.   Family History  Problem Relation Age of Onset  High blood pressure (Hypertension) Mother  Hyperlipidemia (Elevated cholesterol) Mother  Coronary Artery Disease (Blocked arteries around heart) Father  Colon cancer Father  High blood pressure (Hypertension) Father  Hyperlipidemia (Elevated cholesterol) Father  High blood pressure (Hypertension) Brother  Hyperlipidemia (Elevated cholesterol) Brother    Social History   Tobacco Use  Smoking Status Never  Smokeless Tobacco Never  Social History   Socioeconomic History  Marital status: Married  Tobacco Use  Smoking status: Never  Smokeless tobacco: Never  Vaping Use  Vaping  status: Never Used  Substance and Sexual Activity  Alcohol use: Never  Drug use: Never   Objective:   Vitals:  BP: 136/78  Pulse: 51  Temp: 37 C (98.6 F)  SpO2: 99%  Weight: 91 kg (200 lb 9.6 oz)  Height: 190.5 cm (6\' 3" )  PainSc: 0-No pain   Body mass index is 25.07 kg/m.  Physical Exam   On the right posterior lateral neck is a healed epithelialized wound measuring 1.5 cm in greatest dimension. It is slightly nodular. There are no satellite lesions.   Assessment and Plan:   Dysplastic nevus of neck  Patient is referred by his dermatologist for surgical excision of a dysplastic nevus on the right neck. Today we discussed doing this under sedation and local anesthesia as an outpatient surgical procedure. We discussed the size and location of the surgical incision. We discussed his postoperative recovery.  Patient should discontinue aspirin 5 days prior to his procedure.  Patient will be scheduled for outpatient surgery at a time convenient for him in the near future.   Darnell Level, MD [facility] practice Office: [phone]

## 2023-03-06 NOTE — Anesthesia Preprocedure Evaluation (Signed)
Anesthesia Evaluation  Patient identified by MRN, date of birth, ID band Patient awake    Reviewed: Allergy & Precautions, NPO status , Patient's Chart, lab work & pertinent test results  Airway Mallampati: II  TM Distance: >3 FB Neck ROM: Full    Dental no notable dental hx. (+) Teeth Intact, Dental Advisory Given   Pulmonary asthma    Pulmonary exam normal breath sounds clear to auscultation       Cardiovascular hypertension, Pt. on medications Normal cardiovascular exam Rhythm:Regular Rate:Normal     Neuro/Psych  PSYCHIATRIC DISORDERS Anxiety Depression       GI/Hepatic ,GERD  Medicated and Controlled,,  Endo/Other  Hypothyroidism    Renal/GU      Musculoskeletal negative musculoskeletal ROS (+)    Abdominal   Peds  Hematology   Anesthesia Other Findings WUJ:WJXBJY diskus, Durezol  Reproductive/Obstetrics                             Anesthesia Physical Anesthesia Plan  ASA: 2  Anesthesia Plan: MAC   Post-op Pain Management: Ofirmev IV (intra-op)* and Precedex   Induction:   PONV Risk Score and Plan: 1 and Propofol infusion and Treatment may vary due to age or medical condition  Airway Management Planned: Nasal Cannula and Natural Airway  Additional Equipment: None  Intra-op Plan:   Post-operative Plan: Extubation in OR  Informed Consent: I have reviewed the patients History and Physical, chart, labs and discussed the procedure including the risks, benefits and alternatives for the proposed anesthesia with the patient or authorized representative who has indicated his/her understanding and acceptance.     Dental advisory given  Plan Discussed with: CRNA  Anesthesia Plan Comments:         Anesthesia Quick Evaluation

## 2023-03-07 ENCOUNTER — Ambulatory Visit (HOSPITAL_BASED_OUTPATIENT_CLINIC_OR_DEPARTMENT_OTHER): Payer: Medicare Other | Admitting: Anesthesiology

## 2023-03-07 ENCOUNTER — Ambulatory Visit (HOSPITAL_BASED_OUTPATIENT_CLINIC_OR_DEPARTMENT_OTHER)
Admission: RE | Admit: 2023-03-07 | Discharge: 2023-03-07 | Disposition: A | Discharge status: Home / Self Care | Payer: Medicare Other | Attending: Surgery | Admitting: Surgery

## 2023-03-07 ENCOUNTER — Other Ambulatory Visit: Payer: Self-pay

## 2023-03-07 ENCOUNTER — Encounter (HOSPITAL_BASED_OUTPATIENT_CLINIC_OR_DEPARTMENT_OTHER)
Admission: RE | Disposition: A | Discharge status: Home / Self Care | Payer: Self-pay | Source: Home / Self Care | Attending: Surgery

## 2023-03-07 ENCOUNTER — Encounter (HOSPITAL_BASED_OUTPATIENT_CLINIC_OR_DEPARTMENT_OTHER): Payer: Self-pay | Admitting: Surgery

## 2023-03-07 DIAGNOSIS — Z7982 Long term (current) use of aspirin: Secondary | ICD-10-CM | POA: Diagnosis not present

## 2023-03-07 DIAGNOSIS — D234 Other benign neoplasm of skin of scalp and neck: Secondary | ICD-10-CM

## 2023-03-07 DIAGNOSIS — Z01818 Encounter for other preprocedural examination: Secondary | ICD-10-CM

## 2023-03-07 DIAGNOSIS — E785 Hyperlipidemia, unspecified: Secondary | ICD-10-CM | POA: Diagnosis not present

## 2023-03-07 DIAGNOSIS — D239 Other benign neoplasm of skin, unspecified: Secondary | ICD-10-CM | POA: Diagnosis not present

## 2023-03-07 DIAGNOSIS — D224 Melanocytic nevi of scalp and neck: Secondary | ICD-10-CM | POA: Insufficient documentation

## 2023-03-07 HISTORY — DX: Personal history of diseases of the blood and blood-forming organs and certain disorders involving the immune mechanism: Z86.2

## 2023-03-07 HISTORY — DX: Glaucoma secondary to drugs, right eye, stage unspecified: H40.61X0

## 2023-03-07 HISTORY — DX: Other obstructive and reflux uropathy: N13.8

## 2023-03-07 HISTORY — DX: Presence of spectacles and contact lenses: Z97.3

## 2023-03-07 HISTORY — DX: Unspecified iridocyclitis: H20.9

## 2023-03-07 HISTORY — DX: Other specified postprocedural states: Z98.890

## 2023-03-07 HISTORY — PX: EXCISION OF KELOID: SHX6267

## 2023-03-07 HISTORY — DX: Moderate persistent asthma, uncomplicated: J45.40

## 2023-03-07 HISTORY — DX: Unspecified symptoms and signs involving the genitourinary system: R39.9

## 2023-03-07 HISTORY — DX: Hyperlipidemia, unspecified: E78.5

## 2023-03-07 HISTORY — DX: Retention of urine, unspecified: R33.9

## 2023-03-07 LAB — POCT I-STAT, CHEM 8
BUN: 15 mg/dL (ref 8–23)
Calcium, Ion: 1.19 mmol/L (ref 1.15–1.40)
Chloride: 94 mmol/L — ABNORMAL LOW (ref 98–111)
Creatinine, Ser: 1 mg/dL (ref 0.61–1.24)
Glucose, Bld: 90 mg/dL (ref 70–99)
HCT: 46 % (ref 39.0–52.0)
Hemoglobin: 15.6 g/dL (ref 13.0–17.0)
Potassium: 5 mmol/L (ref 3.5–5.1)
Sodium: 130 mmol/L — ABNORMAL LOW (ref 135–145)
TCO2: 30 mmol/L (ref 22–32)

## 2023-03-07 SURGERY — EXCISION, KELOID
Anesthesia: Monitor Anesthesia Care | Laterality: Right

## 2023-03-07 MED ORDER — CEFAZOLIN SODIUM-DEXTROSE 2-4 GM/100ML-% IV SOLN
INTRAVENOUS | Status: AC
  Filled 2023-03-07: qty 100

## 2023-03-07 MED ORDER — LIDOCAINE 2% (20 MG/ML) 5 ML SYRINGE
INTRAMUSCULAR | Status: DC | PRN
  Administered 2023-03-07: 20 mg via INTRAVENOUS

## 2023-03-07 MED ORDER — CEFAZOLIN SODIUM-DEXTROSE 2-4 GM/100ML-% IV SOLN
2.0000 g | INTRAVENOUS | Status: AC
  Administered 2023-03-07: 2 g via INTRAVENOUS

## 2023-03-07 MED ORDER — BUPIVACAINE HCL (PF) 0.25 % IJ SOLN
INTRAMUSCULAR | Status: DC | PRN
  Administered 2023-03-07: 7 mL

## 2023-03-07 MED ORDER — ACETAMINOPHEN 10 MG/ML IV SOLN
INTRAVENOUS | Status: AC
  Filled 2023-03-07: qty 100

## 2023-03-07 MED ORDER — LACTATED RINGERS IV SOLN: INTRAVENOUS | Status: DC

## 2023-03-07 MED ORDER — 0.9 % SODIUM CHLORIDE (POUR BTL) OPTIME
TOPICAL | Status: DC | PRN
  Administered 2023-03-07: 500 mL

## 2023-03-07 MED ORDER — MIDAZOLAM HCL 2 MG/2ML IJ SOLN
INTRAMUSCULAR | Status: AC
  Filled 2023-03-07: qty 2

## 2023-03-07 MED ORDER — PROPOFOL 500 MG/50ML IV EMUL
INTRAVENOUS | Status: DC | PRN
  Administered 2023-03-07: 200 ug/kg/min via INTRAVENOUS

## 2023-03-07 MED ORDER — PROPOFOL 10 MG/ML IV BOLUS
INTRAVENOUS | Status: AC
  Filled 2023-03-07: qty 20

## 2023-03-07 MED ORDER — CHLORHEXIDINE GLUCONATE CLOTH 2 % EX PADS: 6.0000 | MEDICATED_PAD | Freq: Once | CUTANEOUS | Status: DC

## 2023-03-07 MED ORDER — PROPOFOL 1000 MG/100ML IV EMUL
INTRAVENOUS | Status: AC
  Filled 2023-03-07: qty 100

## 2023-03-07 MED ORDER — FENTANYL CITRATE (PF) 100 MCG/2ML IJ SOLN
INTRAMUSCULAR | Status: AC
  Filled 2023-03-07: qty 2

## 2023-03-07 MED ORDER — PROPOFOL 500 MG/50ML IV EMUL
INTRAVENOUS | Status: AC
  Filled 2023-03-07: qty 50

## 2023-03-07 MED ORDER — MIDAZOLAM HCL 2 MG/2ML IJ SOLN
INTRAMUSCULAR | Status: DC | PRN
  Administered 2023-03-07: 1 mg via INTRAVENOUS

## 2023-03-07 MED ORDER — FENTANYL CITRATE (PF) 100 MCG/2ML IJ SOLN
INTRAMUSCULAR | Status: DC | PRN
  Administered 2023-03-07: 50 ug via INTRAVENOUS

## 2023-03-07 MED ORDER — PROPOFOL 10 MG/ML IV BOLUS
INTRAVENOUS | Status: DC | PRN
  Administered 2023-03-07: 40 mg via INTRAVENOUS

## 2023-03-07 MED ORDER — ACETAMINOPHEN 10 MG/ML IV SOLN
INTRAVENOUS | Status: DC | PRN
  Administered 2023-03-07: 1000 mg via INTRAVENOUS

## 2023-03-07 SURGICAL SUPPLY — 40 items
ADH SKN CLS APL DERMABOND .7 (GAUZE/BANDAGES/DRESSINGS) ×1
APL PRP STRL LF DISP 70% ISPRP (MISCELLANEOUS) ×1
APL SKNCLS STERI-STRIP NONHPOA (GAUZE/BANDAGES/DRESSINGS)
BENZOIN TINCTURE PRP APPL 2/3 (GAUZE/BANDAGES/DRESSINGS) IMPLANT
BLADE CLIPPER SENSICLIP SURGIC (BLADE) IMPLANT
BLADE SURG 15 STRL LF DISP TIS (BLADE) ×1 IMPLANT
BLADE SURG 15 STRL SS (BLADE) ×1
BNDG GAUZE DERMACEA FLUFF 4 (GAUZE/BANDAGES/DRESSINGS) IMPLANT
BNDG GZE DERMACEA 4 6PLY (GAUZE/BANDAGES/DRESSINGS)
CHLORAPREP W/TINT 26 (MISCELLANEOUS) ×1 IMPLANT
COVER BACK TABLE 60X90IN (DRAPES) ×1 IMPLANT
COVER MAYO STAND STRL (DRAPES) ×1 IMPLANT
DERMABOND ADVANCED .7 DNX12 (GAUZE/BANDAGES/DRESSINGS) ×1 IMPLANT
DRAPE LAPAROTOMY 100X72 PEDS (DRAPES) IMPLANT
DRAPE SHEET LG 3/4 BI-LAMINATE (DRAPES) IMPLANT
DRAPE UTILITY XL STRL (DRAPES) ×1 IMPLANT
ELECT REM PT RETURN 9FT ADLT (ELECTROSURGICAL) ×1
ELECTRODE REM PT RTRN 9FT ADLT (ELECTROSURGICAL) ×1 IMPLANT
GAUZE 4X4 16PLY ~~LOC~~+RFID DBL (SPONGE) IMPLANT
GAUZE SPONGE 4X4 12PLY STRL (GAUZE/BANDAGES/DRESSINGS) IMPLANT
GLOVE SURG ORTHO 8.0 STRL STRW (GLOVE) ×1 IMPLANT
GOWN STRL REUS W/TWL LRG LVL3 (GOWN DISPOSABLE) ×1 IMPLANT
KIT TURNOVER CYSTO (KITS) ×1 IMPLANT
NDL HYPO 25X1 1.5 SAFETY (NEEDLE) ×1 IMPLANT
NEEDLE HYPO 25X1 1.5 SAFETY (NEEDLE) ×1
NS IRRIG 500ML POUR BTL (IV SOLUTION) ×1 IMPLANT
PACK BASIN DAY SURGERY FS (CUSTOM PROCEDURE TRAY) ×1 IMPLANT
PENCIL SMOKE EVACUATOR (MISCELLANEOUS) ×1 IMPLANT
SLEEVE SCD COMPRESS KNEE MED (STOCKING) ×1 IMPLANT
SUT ETHILON 3 0 PS 1 (SUTURE) IMPLANT
SUT MNCRL AB 4-0 PS2 18 (SUTURE) IMPLANT
SUT SILK 2 0 SH (SUTURE) IMPLANT
SUT VIC AB 3-0 SH 27 (SUTURE) ×1
SUT VIC AB 3-0 SH 27X BRD (SUTURE) IMPLANT
SUT VIC AB 3-0 SH 8-18 (SUTURE) IMPLANT
SYR CONTROL 10ML LL (SYRINGE) ×1 IMPLANT
TOWEL OR 17X24 6PK STRL BLUE (TOWEL DISPOSABLE) ×1 IMPLANT
TRAY DSU PREP LF (CUSTOM PROCEDURE TRAY) IMPLANT
TUBE CONNECTING 12X1/4 (SUCTIONS) IMPLANT
WATER STERILE IRR 500ML POUR (IV SOLUTION) IMPLANT

## 2023-03-07 NOTE — Anesthesia Postprocedure Evaluation (Signed)
Anesthesia Post Note  Patient: Jeffrey Gordon  Procedure(s) Performed: EXCISION OF Dysplastic nevus RIGHT NECK (Right)     Patient location during evaluation: PACU Anesthesia Type: MAC Level of consciousness: awake and alert Pain management: pain level controlled Vital Signs Assessment: post-procedure vital signs reviewed and stable Respiratory status: spontaneous breathing, nonlabored ventilation, respiratory function stable and patient connected to nasal cannula oxygen Cardiovascular status: stable and blood pressure returned to baseline Postop Assessment: no apparent nausea or vomiting Anesthetic complications: no   No notable events documented.  Last Vitals:  Vitals:   03/07/23 1200 03/07/23 1228  BP: 137/86 (!) 151/85  Pulse: (!) 58 (!) 58  Resp: 13 17  Temp:  36.6 C  SpO2: 97% 98%    Last Pain:  Vitals:   03/07/23 1228  TempSrc:   PainSc: 0-No pain                 Trevor Iha

## 2023-03-07 NOTE — Interval H&P Note (Signed)
History and Physical Interval Note:  03/07/2023 10:48 AM  Jeffrey Gordon  has presented today for surgery, with the diagnosis of Dysplastic nevus.  The various methods of treatment have been discussed with the patient and family. After consideration of risks, benefits and other options for treatment, the patient has consented to    Procedure(s) with comments: EXCISION OF Dysplastic nevus RIGHT NECK (Right) - MAC AND LOCAL as a surgical intervention.    The patient's history has been reviewed, patient examined, no change in status, stable for surgery.  I have reviewed the patient's chart and labs.  Questions were answered to the patient's satisfaction.    Darnell Level, MD [facility] [place] [facility] practice Office: [phone]   Darnell Level

## 2023-03-07 NOTE — Anesthesia Procedure Notes (Signed)
Procedure Name: MAC Date/Time: 03/07/2023 11:01 AM  Performed by: Francie Massing, CRNAPre-anesthesia Checklist: Patient identified, Emergency Drugs available, Suction available, Patient being monitored and Timeout performed Oxygen Delivery Method: Simple face mask

## 2023-03-07 NOTE — Transfer of Care (Signed)
Immediate Anesthesia Transfer of Care Note  Patient: Jeffrey Gordon  Procedure(s) Performed: Procedure(s) (LRB): EXCISION OF Dysplastic nevus RIGHT NECK (Right)  Patient Location: PACU  Anesthesia Type: MAC  Level of Consciousness: awake, alert , oriented and patient cooperative  Airway & Oxygen Therapy: Patient Spontanous Breathing on room air  Post-op Assessment: Report given to PACU RN and Post -op Vital signs reviewed and stable  Post vital signs: Reviewed and stable  Complications: No apparent anesthesia complications Last Vitals:  Vitals Value Taken Time  BP    Temp    Pulse 63 03/07/23 1140  Resp    SpO2 96 % 03/07/23 1140  Vitals shown include unfiled device data.  Last Pain:  Vitals:   03/07/23 1005  TempSrc: Oral  PainSc: 0-No pain      Patients Stated Pain Goal: 3 (03/07/23 1005)  Complications: No notable events documented.

## 2023-03-07 NOTE — Op Note (Signed)
Operative Note  Pre-operative Diagnosis:  dysplastic nevus right neck  Post-operative Diagnosis:  same  Surgeon:  Darnell Level, MD  Assistant:  none   Procedure:  wide excision dysplastic nevus right lateral neck (2.5 x 1.5 x 1.0 cm), full thickness  Anesthesia:  local with IV  Estimated Blood Loss:  minimal  Drains: none         Specimen: excision to pathology  Indications:  Patient is referred by Dr. Suan Halter from dermatology for evaluation of a dysplastic nevus on the right neck. This has been excised twice in the office by [employer], PA. On both occasions there was a positive margin with atypical cells and dysplasia. Patient is now referred for definitive surgical excision. Patient has had no other such lesions. He denies any lymphadenopathy. Patient does take baby aspirin daily.   Procedure:  The patient was seen in the pre-op holding area. The risks, benefits, complications, treatment options, and expected outcomes were previously discussed with the patient. The patient agreed with the proposed plan and has signed the informed consent form.  The patient was brought to the operating room by the surgical team, identified as Jeffrey Gordon and the procedure verified. A "time out" was completed and the above information confirmed.  Following administration of intravenous sedation the patient is positioned and then prepped and draped in the usual aseptic fashion.  After ascertaining that an adequate level of sedation had been achieved, local anesthetic is infiltrated circumferentially around the site in the right lateral neck.  Using a #15 blade the previous scar at the site of the dysplastic nevus is excised with a margin of normal skin.  Excised tissue measures approximately 2.5 x 1.5 x 1.0 cm and is full-thickness into the underlying subcutaneous tissues.  Specimen is marked with sutures for orientation purposes.  It is then submitted to pathology for review.  Skin flaps are  elevated circumferentially.  Hemostasis is achieved with the electrocautery.  Subcutaneous tissues are reapproximated with interrupted 3-0 Vicryl sutures.  Skin is closed with a running 4-0 Monocryl subcuticular suture.  Wound was washed and dried and Dermabond is applied as dressing.  Patient is awakened from anesthesia and transported to the recovery room.  The patient tolerated the procedure well.   Darnell Level, MD [facility] Office: [phone]

## 2023-03-07 NOTE — Discharge Instructions (Addendum)
  [facility] [place] [facility] -- DISCHARGE INSTRUCTIONS  REMINDER:   Carry a list of your medications and allergies with you at all times  Call your pharmacy at least 1 week in advance to refill prescriptions  Do not mix any prescribed pain medicine with alcohol  Do not drive any motor vehicles while taking pain medication  Take medications with food unless otherwise directed  Follow-up appointments (date to return to physician): Please call [phone] to confirm your follow up appointment with your surgeon.  Call your Surgeon if you have:  Temperature greater than 101.0  Persistent nausea and vomiting  Severe uncontrolled pain  Redness, tenderness, or signs of infection (pain, swelling, redness, odor or green/yellow discharge around the site)  Difficulty breathing, headache or visual disturbances  Hives  Persistent dizziness or light-headedness  Any other questions or concerns you may have after discharge  In an emergency, call 911 or go to an Emergency Department at a nearby hospital.  Diet: Begin with liquids, and if they are tolerated, resume your usual diet.  Avoid spicy, greasy or heavy foods.  If you have nausea or vomiting, go back to liquids.  If you cannot keep liquids down, call your doctor.  Avoid alcohol consumption while on prescription pain medications. Good nutrition promotes healing. Increase fiber and fluids.   ADDITIONAL INSTRUCTIONS: May use ice pack as needed for comfort.  Leave Dermabond in place for 7 to 10 days.  Patient may shower.  [facility] Office: [phone]   Post Anesthesia Home Care Instructions  Activity: Get plenty of rest for the remainder of the day. A responsible individual must stay with you for 24 hours following the procedure.  For the next 24 hours, DO NOT: -Drive a car -Advertising copywriter -Drink alcoholic beverages -Take any medication unless instructed by your physician -Make any legal decisions or sign important  papers.  Meals: Start with liquid foods such as gelatin or soup. Progress to regular foods as tolerated. Avoid greasy, spicy, heavy foods. If nausea and/or vomiting occur, drink only clear liquids until the nausea and/or vomiting subsides. Call your physician if vomiting continues.  Special Instructions/Symptoms: Your throat may feel dry or sore from the anesthesia or the breathing tube placed in your throat during surgery. If this causes discomfort, gargle with warm salt water. The discomfort should disappear within 24 hours.

## 2023-03-08 ENCOUNTER — Encounter (HOSPITAL_BASED_OUTPATIENT_CLINIC_OR_DEPARTMENT_OTHER): Payer: Self-pay | Admitting: Surgery

## 2023-03-12 NOTE — Progress Notes (Signed)
Good news.  The dysplastic nevus is completely removed.  Darnell Level, MD [facility] practice Office: [phone]

## 2023-03-21 DIAGNOSIS — R7303 Prediabetes: Secondary | ICD-10-CM | POA: Diagnosis not present

## 2023-03-21 DIAGNOSIS — E039 Hypothyroidism, unspecified: Secondary | ICD-10-CM | POA: Diagnosis not present

## 2023-03-21 DIAGNOSIS — J4540 Moderate persistent asthma, uncomplicated: Secondary | ICD-10-CM | POA: Diagnosis not present

## 2023-03-27 DIAGNOSIS — J4540 Moderate persistent asthma, uncomplicated: Secondary | ICD-10-CM | POA: Diagnosis not present

## 2023-03-27 DIAGNOSIS — E039 Hypothyroidism, unspecified: Secondary | ICD-10-CM | POA: Diagnosis not present

## 2023-03-27 DIAGNOSIS — F32A Depression, unspecified: Secondary | ICD-10-CM | POA: Diagnosis not present

## 2023-03-27 DIAGNOSIS — Z23 Encounter for immunization: Secondary | ICD-10-CM | POA: Diagnosis not present

## 2023-03-27 DIAGNOSIS — R7303 Prediabetes: Secondary | ICD-10-CM | POA: Diagnosis not present

## 2023-03-27 DIAGNOSIS — Z0000 Encounter for general adult medical examination without abnormal findings: Secondary | ICD-10-CM | POA: Diagnosis not present

## 2023-03-27 DIAGNOSIS — I10 Essential (primary) hypertension: Secondary | ICD-10-CM | POA: Diagnosis not present

## 2023-03-27 DIAGNOSIS — K219 Gastro-esophageal reflux disease without esophagitis: Secondary | ICD-10-CM | POA: Diagnosis not present

## 2023-03-27 DIAGNOSIS — E782 Mixed hyperlipidemia: Secondary | ICD-10-CM | POA: Diagnosis not present

## 2023-07-17 DIAGNOSIS — D225 Melanocytic nevi of trunk: Secondary | ICD-10-CM | POA: Diagnosis not present

## 2023-07-26 ENCOUNTER — Ambulatory Visit
Admission: EM | Admit: 2023-07-26 | Discharge: 2023-07-26 | Disposition: A | Discharge status: Home / Self Care | Payer: Medicare Other | Attending: Nurse Practitioner | Admitting: Nurse Practitioner

## 2023-07-26 DIAGNOSIS — J069 Acute upper respiratory infection, unspecified: Secondary | ICD-10-CM | POA: Diagnosis not present

## 2023-07-26 DIAGNOSIS — B349 Viral infection, unspecified: Secondary | ICD-10-CM

## 2023-07-26 LAB — POC COVID19/FLU A&B COMBO
Covid Antigen, POC: NEGATIVE
Influenza A Antigen, POC: NEGATIVE
Influenza B Antigen, POC: NEGATIVE

## 2023-07-26 NOTE — ED Provider Notes (Signed)
[facility]    CSN: [id number] Arrival date & time: 07/26/23  1026      History   Chief Complaint No chief complaint on file.   HPI Jeffrey Gordon is a 67 y.o. male.   The history is provided by the patient.   Patient presents with a 3-day history of fever, chills, body aches, headache, nasal congestion, and cough.  Patient denies ear pain, ear drainage, wheezing, difficulty breathing, shortness of breath, chest pain, abdominal pain, nausea, vomiting, diarrhea, or rash.  Patient reports that his wife has been sick with the same or similar symptoms.  Reports he has been taking over-the-counter Mucinex and using a prior prescription of Promethazine DM for his cough.  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, right eye    borderline   History of anemia    due to blood loss post op right inguinal hernia repair 08/ 2021   History of esophageal dilatation    2007   Hyperlipidemia    Hypertension    Hypothyroidism    followed by pcp   Incomplete emptying of bladder    Lower urinary tract symptoms (LUTS)    Moderate persistent asthma in adult without complication    pulmonology--- dr Silvestre Gunner;  left granulomas secondary to histoplasma infection as teen yrs ago   Uveitis    Wears glasses     Patient Active Problem List   Diagnosis Date Noted   Dysplastic nevus of neck 03/02/2023   Benign prostatic hyperplasia with urinary obstruction 04/21/2020   Urinary retention 04/21/2020   Postoperative anemia due to acute blood loss 04/14/2020   HTN (hypertension) 04/14/2020   Depression 04/14/2020   Hyponatremia 04/13/2020   Right inguinal hernia 03/17/2020   Retinal detachment 02/05/2009   PREMATURE EJACULATION 10/21/2008   ERECTILE DYSFUNCTION 11/06/2006   HYPERLIPIDEMIA 07/04/2006   DEPRESSION 07/04/2006    Essential hypertension 07/04/2006   Allergic rhinitis 07/04/2006   Esophagitis 07/04/2006   GERD 07/04/2006   IBS 07/04/2006   BENIGN PROSTATIC HYPERTROPHY 07/04/2006   Dysphagia 07/04/2006   FRACTURE, FINGER 07/04/2006    Past Surgical History:  Procedure Laterality Date   CATARACT EXTRACTION W/ INTRAOCULAR LENS  IMPLANT, BILATERAL     2002 (left); 2012 (right)   COLONOSCOPY WITH PROPOFOL  02/10/2023   dr Marina Goodell   EXCISION OF KELOID Right 03/07/2023   Procedure: EXCISION OF Dysplastic nevus RIGHT NECK;  Surgeon: Darnell Level, MD;  Location: [facility];  Service: General;  Laterality: Right;  MAC AND LOCAL   INGUINAL HERNIA REPAIR Left 1989   INGUINAL HERNIA REPAIR Right 04/10/2020   Procedure: HERNIA REPAIR INGUINAL ADULT;  Surgeon: Lucretia Roers, MD;  Location: [facility];  Service: General;  Laterality: Right;   PARS PLANA VITRECTOMY  10/20/2011   Procedure: PARS PLANA VITRECTOMY WITH 25G REMOVAL/SUTURE INTRAOCULAR LENS;  Surgeon: Sherrie George, MD;  Location: [facility] OR;  Service: Ophthalmology;  Laterality: Left;   RETINAL DETACHMENT SURGERY Left 04/10/2001   @[facility]  by dr Shela Commons. Ashley Royalty;   10-02-2001 surgery for recurrent scleral buckle #2 laser repair       Home Medications    Prior to Admission medications   Medication Sig Start Date End Date Taking? Authorizing Provider  acetaminophen (TYLENOL) 500 MG tablet Take 1,000 mg by mouth every  6 (six) hours as needed.    [provider]  aspirin EC 81 MG tablet Take 1 tablet (81 mg total) by mouth daily with breakfast. Patient taking differently: Take 81 mg by mouth at bedtime. 04/17/20   Shon Hale, MD  benzonatate (TESSALON) 100 MG capsule Take 1 capsule (100 mg total) by mouth 3 (three) times daily as needed for cough. Do not take with alcohol or while driving or operating heavy machinery.  May cause drowsiness. Patient not taking: Reported on 02/27/2023 12/22/22   Valentino Nose, NP   budesonide-formoterol (SYMBICORT) 160-4.5 MCG/ACT inhaler INHALE 2 PUFFS INTO THE LUNGS TWICE DAILY Patient taking differently: Inhale 1-2 puffs into the lungs 2 (two) times daily. 01/13/22   Mannam, Colbert Coyer, MD  Coenzyme Q10 (COQ-10) 100 MG CAPS Take 1 capsule by mouth at bedtime.    [provider]  diphenhydrAMINE (BENADRYL) 25 MG tablet Take 25 mg by mouth every 6 (six) hours as needed.    [provider]  FLUoxetine (PROZAC) 40 MG capsule Take 40 mg by mouth daily.    [provider]  latanoprost (XALATAN) 0.005 % ophthalmic solution Place 1 drop into both eyes at bedtime. 02/20/20   [provider]  levothyroxine (SYNTHROID) 100 MCG tablet Take 100 mcg by mouth daily. 12/19/22   [provider]  losartan (COZAAR) 50 MG tablet Take 50 mg by mouth daily. 12/12/22   [provider]  Magnesium 250 MG TABS Take 1 tablet by mouth at bedtime.    [provider]  Melatonin 5 MG CHEW Chew by mouth at bedtime.    [provider]  Multiple Vitamins-Minerals (PRESERVISION AREDS 2 PO) Take 1 capsule by mouth in the morning and at bedtime.    [provider]  omeprazole (PRILOSEC) 20 MG capsule Take 20 mg by mouth daily.    [provider]  Polyethylene Glycol 3350 (MIRALAX PO) Take by mouth as needed.    [provider]  promethazine-dextromethorphan (PROMETHAZINE-DM) 6.25-15 MG/5ML syrup Take 2.5 mLs by mouth at bedtime as needed for cough. 12/22/22   Valentino Nose, NP  rosuvastatin (CRESTOR) 10 MG tablet Take 10 mg by mouth at bedtime.    [provider]  sildenafil (VIAGRA) 100 MG tablet Take 100 mg by mouth daily as needed for erectile dysfunction.    [provider]  tamsulosin (FLOMAX) 0.4 MG CAPS capsule TAKE 1 CAPSULE(0.4 MG) BY MOUTH TWICE DAILY Patient taking differently: Take 0.4 mg by mouth 2 (two) times daily. TAKE 1 CAPSULE(0.4 MG) BY MOUTH TWICE DAILY 01/23/23   McKenzie,  Mardene Celeste, MD    Family History Family History  Problem Relation Age of Onset   Colon cancer Father    Colon cancer Paternal Aunt    Esophageal cancer Neg Hx    Rectal cancer Neg Hx    Stomach cancer Neg Hx     Social History Social History   Tobacco Use   Smoking status: Never   Smokeless tobacco: Never  Vaping Use   Vaping status: Never Used  Substance Use Topics   Alcohol use: Not Currently   Drug use: Never     Allergies   Durezol [difluprednate] and Advair diskus [fluticasone-salmeterol]   Review of Systems Review of Systems Per HPI  Physical Exam Triage Vital Signs ED Triage Vitals  Encounter Vitals Group     BP 07/26/23 1051 118/70     Systolic BP Percentile --      Diastolic BP  Percentile --      Pulse Rate 07/26/23 1051 76     Resp 07/26/23 1051 19     Temp 07/26/23 1051 98.2 F (36.8 C)     Temp Source 07/26/23 1051 Oral     SpO2 07/26/23 1051 95 %     Weight --      Height --      Head Circumference --      Peak Flow --      Pain Score 07/26/23 1055 0     Pain Loc --      Pain Education --      Exclude from Growth Chart --    No data found.  Updated Vital Signs BP 118/70 (BP Location: Right Arm)   Pulse 76   Temp 98.2 F (36.8 C) (Oral)   Resp 19   SpO2 95%   Visual Acuity Right Eye Distance:   Left Eye Distance:   Bilateral Distance:    Right Eye Near:   Left Eye Near:    Bilateral Near:     Physical Exam Vitals and nursing note reviewed.  Constitutional:      General: He is not in acute distress.    Appearance: Normal appearance.  HENT:     Head: Normocephalic.     Right Ear: Tympanic membrane, ear canal and external ear normal.     Left Ear: Tympanic membrane, ear canal and external ear normal.     Nose: Congestion present.     Right Turbinates: Enlarged and swollen.     Left Turbinates: Enlarged and swollen.     Right Sinus: No maxillary sinus tenderness or frontal sinus tenderness.     Left Sinus: No maxillary  sinus tenderness or frontal sinus tenderness.     Mouth/Throat:     Lips: Pink.     Mouth: Mucous membranes are moist.     Pharynx: Uvula midline. Postnasal drip present.     Comments: Cobblestoning present to posterior oropharynx  Eyes:     Extraocular Movements: Extraocular movements intact.     Conjunctiva/sclera: Conjunctivae normal.     Pupils: Pupils are equal, round, and reactive to light.  Cardiovascular:     Rate and Rhythm: Normal rate and regular rhythm.     Pulses: Normal pulses.     Heart sounds: Normal heart sounds.  Pulmonary:     Effort: Pulmonary effort is normal. No respiratory distress.     Breath sounds: Normal breath sounds. No stridor. No wheezing, rhonchi or rales.  Abdominal:     General: Bowel sounds are normal.     Palpations: Abdomen is soft.     Tenderness: There is no abdominal tenderness.  Musculoskeletal:     Cervical back: Normal range of motion.  Lymphadenopathy:     Cervical: No cervical adenopathy.  Skin:    General: Skin is warm and dry.  Neurological:     General: No focal deficit present.     Mental Status: He is alert and oriented to person, place, and time.  Psychiatric:        Mood and Affect: Mood normal.        Behavior: Behavior normal.      UC Treatments / Results  Labs (all labs ordered are listed, but only abnormal results are displayed) Labs Reviewed  POC COVID19/FLU A&B COMBO    EKG   Radiology No results found.  Procedures Procedures (including critical care time)  Medications Ordered in UC Medications - No  data to display  Initial Impression / Assessment and Plan / UC Course  I have reviewed the triage vital signs and the nursing notes.  Pertinent labs & imaging results that were available during my care of the patient were reviewed by me and considered in my medical decision making (see chart for details).  The COVID/flu test is negative.  Suspect viral etiology at this time.  Patient feels he is doing  well with Mucinex and the Promethazine DM.  Supportive care recommendations were provided and discussed with the patient to include fluids, rest, over-the-counter analgesics, and use of a humidifier during sleep.  Discussed at length indications with the patient of when follow-up may be indicated.  Patient was in agreement with this plan of care and verbalized understanding.  All questions were answered.  Patient stable for discharge.  Final Clinical Impressions(s) / UC Diagnoses   Final diagnoses:  Viral illness  Viral upper respiratory tract infection with cough     Discharge Instructions      The COVID/flu test was negative. Continue over-the-counter Tylenol for pain, fever, or general discomfort. Continue Mucinex and Promethazine DM for your cough. Allow for plenty of fluids and for plenty of rest.  Make sure you are drinking at least 8-10 8 ounce glasses of water daily. Recommend using a humidifier in your bedroom at nighttime during sleep and sleeping slightly elevated on pillows while cough symptoms persist. For your nasal congestion, recommend normal saline nasal spray throughout the day for nasal congestion or runny nose. If you experience worsening cough with new symptoms of shortness of breath, difficulty breathing, or persistent fever, please follow-up in this clinic or with your primary care physician for further evaluation. Follow-up as needed.     ED Prescriptions   None    PDMP not reviewed this encounter.   Abran Cantor, NP 07/26/23 1329

## 2023-07-26 NOTE — ED Triage Notes (Signed)
Pt reports nasal congestion, fever, cough x 4 days.

## 2023-07-26 NOTE — Discharge Instructions (Signed)
The COVID/flu test was negative. Continue over-the-counter Tylenol for pain, fever, or general discomfort. Continue Mucinex and Promethazine DM for your cough. Allow for plenty of fluids and for plenty of rest.  Make sure you are drinking at least 8-10 8 ounce glasses of water daily. Recommend using a humidifier in your bedroom at nighttime during sleep and sleeping slightly elevated on pillows while cough symptoms persist. For your nasal congestion, recommend normal saline nasal spray throughout the day for nasal congestion or runny nose. If you experience worsening cough with new symptoms of shortness of breath, difficulty breathing, or persistent fever, please follow-up in this clinic or with your primary care physician for further evaluation. Follow-up as needed.

## 2023-09-21 DIAGNOSIS — I10 Essential (primary) hypertension: Secondary | ICD-10-CM | POA: Diagnosis not present

## 2023-09-21 DIAGNOSIS — E039 Hypothyroidism, unspecified: Secondary | ICD-10-CM | POA: Diagnosis not present

## 2023-09-21 DIAGNOSIS — R7303 Prediabetes: Secondary | ICD-10-CM | POA: Diagnosis not present

## 2023-09-27 DIAGNOSIS — I10 Essential (primary) hypertension: Secondary | ICD-10-CM | POA: Diagnosis not present

## 2023-09-27 DIAGNOSIS — K219 Gastro-esophageal reflux disease without esophagitis: Secondary | ICD-10-CM | POA: Diagnosis not present

## 2023-09-27 DIAGNOSIS — E039 Hypothyroidism, unspecified: Secondary | ICD-10-CM | POA: Diagnosis not present

## 2023-09-27 DIAGNOSIS — R251 Tremor, unspecified: Secondary | ICD-10-CM | POA: Diagnosis not present

## 2023-09-27 DIAGNOSIS — R7303 Prediabetes: Secondary | ICD-10-CM | POA: Diagnosis not present

## 2023-09-27 DIAGNOSIS — E782 Mixed hyperlipidemia: Secondary | ICD-10-CM | POA: Diagnosis not present

## 2023-09-27 DIAGNOSIS — J4540 Moderate persistent asthma, uncomplicated: Secondary | ICD-10-CM | POA: Diagnosis not present

## 2023-09-28 DIAGNOSIS — H40012 Open angle with borderline findings, low risk, left eye: Secondary | ICD-10-CM | POA: Diagnosis not present

## 2023-09-28 DIAGNOSIS — H4061X1 Glaucoma secondary to drugs, right eye, mild stage: Secondary | ICD-10-CM | POA: Diagnosis not present

## 2023-09-28 DIAGNOSIS — H4061X0 Glaucoma secondary to drugs, right eye, stage unspecified: Secondary | ICD-10-CM | POA: Diagnosis not present

## 2023-10-18 DIAGNOSIS — H40053 Ocular hypertension, bilateral: Secondary | ICD-10-CM | POA: Diagnosis not present

## 2023-10-18 DIAGNOSIS — H35363 Drusen (degenerative) of macula, bilateral: Secondary | ICD-10-CM | POA: Diagnosis not present

## 2023-10-18 DIAGNOSIS — Z961 Presence of intraocular lens: Secondary | ICD-10-CM | POA: Diagnosis not present

## 2023-12-06 DIAGNOSIS — J45909 Unspecified asthma, uncomplicated: Secondary | ICD-10-CM | POA: Diagnosis not present

## 2023-12-06 DIAGNOSIS — U071 COVID-19: Secondary | ICD-10-CM | POA: Diagnosis not present

## 2023-12-06 DIAGNOSIS — R051 Acute cough: Secondary | ICD-10-CM | POA: Diagnosis not present

## 2024-01-22 ENCOUNTER — Encounter: Payer: Self-pay | Admitting: Urology

## 2024-01-22 ENCOUNTER — Ambulatory Visit: Payer: Medicare Other | Admitting: Urology

## 2024-01-22 VITALS — BP 145/75 | HR 79

## 2024-01-22 DIAGNOSIS — R338 Other retention of urine: Secondary | ICD-10-CM | POA: Diagnosis not present

## 2024-01-22 DIAGNOSIS — N138 Other obstructive and reflux uropathy: Secondary | ICD-10-CM

## 2024-01-22 DIAGNOSIS — N401 Enlarged prostate with lower urinary tract symptoms: Secondary | ICD-10-CM | POA: Diagnosis not present

## 2024-01-22 DIAGNOSIS — R339 Retention of urine, unspecified: Secondary | ICD-10-CM

## 2024-01-22 LAB — URINALYSIS, ROUTINE W REFLEX MICROSCOPIC
Bilirubin, UA: NEGATIVE
Glucose, UA: NEGATIVE
Ketones, UA: NEGATIVE
Leukocytes,UA: NEGATIVE
Nitrite, UA: NEGATIVE
Protein,UA: NEGATIVE
RBC, UA: NEGATIVE
Specific Gravity, UA: <1.005 — ABNORMAL LOW (ref 1.005–1.030)
Urobilinogen, Ur: 0.2 mg/dL (ref 0.2–1.0)
pH, UA: 7 (ref 5.0–7.5)

## 2024-01-22 LAB — BLADDER SCAN AMB NON-IMAGING: Scan Result: 117

## 2024-01-22 MED ORDER — TAMSULOSIN HCL 0.4 MG PO CAPS: 0.4000 mg | ORAL_CAPSULE | Freq: Two times a day (BID) | ORAL | 3 refills | Status: AC

## 2024-01-22 MED ORDER — FINASTERIDE 5 MG PO TABS: 5.0000 mg | ORAL_TABLET | Freq: Every day | ORAL | 3 refills | Status: AC

## 2024-01-22 NOTE — Addendum Note (Signed)
 Addended by: Leshay Desaulniers L on: 01/22/2024 10:46 AM   Modules accepted: Orders

## 2024-01-22 NOTE — Progress Notes (Signed)
post void residual= 117

## 2024-01-22 NOTE — Progress Notes (Signed)
 01/22/2024 10:36 AM   Jeffrey Gordon [birth year] [id number]  Referring provider: Omie Bickers, MD [address] [place],  [place] [address]  Followup BPH   HPI: [name] is a [phone] here for followup for BPH with urinary retention. PVR 117cc. IPSS 12 QOl 2 on flomax  0.4mg  BID. His urine stream is good. He has worsening urgency since last visit. Urgency happens once per day. No straining to urinate.   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, right eye    borderline   History of anemia    due to blood loss post op right inguinal hernia repair 08/ 2021   History of esophageal dilatation    2007   Hyperlipidemia    Hypertension    Hypothyroidism    followed by pcp   Incomplete emptying of bladder    Lower urinary tract symptoms (LUTS)    Moderate persistent asthma in adult without complication    pulmonology--- dr March Service;  left granulomas secondary to histoplasma infection as teen yrs ago   Uveitis    Wears glasses     Surgical History: Past Surgical History:  Procedure Laterality Date   CATARACT EXTRACTION W/ INTRAOCULAR LENS  IMPLANT, BILATERAL     2002 (left); 2012 (right)   COLONOSCOPY WITH PROPOFOL   02/10/2023   dr Elvin Hammer   EXCISION OF KELOID Right 03/07/2023   Procedure: EXCISION OF Dysplastic nevus RIGHT NECK;  Surgeon: Oralee Billow, MD;  Location: [facility] ;  Service: General;  Laterality: Right;  MAC AND LOCAL   INGUINAL HERNIA REPAIR Left 1989   INGUINAL HERNIA REPAIR Right 04/10/2020   Procedure: HERNIA REPAIR INGUINAL ADULT;  Surgeon: Awilda Bogus, MD;  Location: [facility];  Service: General;  Laterality: Right;   PARS PLANA VITRECTOMY  10/20/2011   Procedure: PARS PLANA VITRECTOMY WITH 25G REMOVAL/SUTURE INTRAOCULAR LENS;  Surgeon: Rexene Catching, MD;  Location: [facility] OR;   Service: Ophthalmology;  Laterality: Left;   RETINAL DETACHMENT SURGERY Left 04/10/2001   @[facility]  by dr Kirk Peper. Augustus Ledger;   10-02-2001 surgery for recurrent scleral buckle #2 laser repair    Home Medications:  Allergies as of 01/22/2024       Reactions   Durezol [difluprednate] Other (See Comments)   "Pressure increased in my eye"   Advair Diskus [fluticasone-salmeterol] Other (See Comments)   Issues sleeping        Medication List        Accurate as of January 22, 2024 10:36 AM. If you have any questions, ask your nurse or doctor.          STOP taking these medications    aspirin  EC 81 MG tablet   benzonatate  100 MG capsule Commonly known as: TESSALON        TAKE these medications    acetaminophen  500 MG tablet Commonly known as: TYLENOL  Take 1,000 mg by mouth every 6 (six) hours as needed.   CoQ-10 100 MG Caps Take 1 capsule by mouth at bedtime.   diphenhydrAMINE 25 MG tablet Commonly known as: BENADRYL Take 25 mg by mouth every 6 (six) hours as needed.   FLUoxetine 40 MG capsule Commonly known as: PROZAC Take 40 mg by mouth daily.   latanoprost  0.005 % ophthalmic solution Commonly known as: XALATAN  Place 1 drop  into both eyes at bedtime.   levothyroxine  100 MCG tablet Commonly known as: SYNTHROID  Take 100 mcg by mouth daily.   losartan 50 MG tablet Commonly known as: COZAAR Take 50 mg by mouth daily.   Magnesium  250 MG Tabs Take 1 tablet by mouth at bedtime.   Melatonin 5 MG Chew Chew by mouth at bedtime.   MIRALAX  PO Take by mouth as needed.   omeprazole 20 MG capsule Commonly known as: PRILOSEC Take 20 mg by mouth daily.   PRESERVISION AREDS 2 PO Take 1 capsule by mouth in the morning and at bedtime.   promethazine -dextromethorphan 6.25-15 MG/5ML syrup Commonly known as: PROMETHAZINE -DM Take 2.5 mLs by mouth at bedtime as needed for cough.   rosuvastatin 10 MG tablet Commonly known as: CRESTOR Take 10 mg by mouth at bedtime.   sildenafil  100 MG tablet Commonly known as: VIAGRA Take 100 mg by mouth daily as needed for erectile dysfunction.   Symbicort  160-4.5 MCG/ACT inhaler Generic drug: budesonide -formoterol  INHALE 2 PUFFS INTO THE LUNGS TWICE DAILY What changed: how much to take   tamsulosin  0.4 MG Caps capsule Commonly known as: FLOMAX  TAKE 1 CAPSULE(0.4 MG) BY MOUTH TWICE DAILY What changed:  how much to take how to take this when to take this        Allergies:  Allergies  Allergen Reactions   Durezol [Difluprednate] Other (See Comments)    "Pressure increased in my eye"   Advair Diskus [Fluticasone-Salmeterol] Other (See Comments)    Issues sleeping    Family History: Family History  Problem Relation Age of Onset   Colon cancer Father    Colon cancer Paternal Aunt    Esophageal cancer Neg Hx    Rectal cancer Neg Hx    Stomach cancer Neg Hx     Social History:  reports that he has never smoked. He has never used smokeless tobacco. He reports that he does not currently use alcohol. He reports that he does not use drugs.  ROS: All other review of systems were reviewed and are negative except what is noted above in HPI  Physical Exam: BP (!) 145/75   Pulse 79   Constitutional:  Alert and oriented, No acute distress. HEENT: [place] AT, moist mucus membranes.  Trachea midline, no masses. Cardiovascular: No clubbing, cyanosis, or edema. Respiratory: Normal respiratory effort, no increased work of breathing. GI: Abdomen is soft, nontender, nondistended, no abdominal masses GU: No CVA tenderness.  Lymph: No cervical or inguinal lymphadenopathy. Skin: No rashes, bruises or suspicious lesions. Neurologic: Grossly intact, no focal deficits, moving all 4 extremities. Psychiatric: Normal mood and affect.  Laboratory Data: Lab Results  Component Value Date   WBC 11.7 (H) 04/16/2020   HGB 15.6 03/07/2023   HCT 46.0 03/07/2023   MCV 89.4 04/16/2020   PLT 313 04/16/2020    Lab Results  Component  Value Date   CREATININE 1.00 03/07/2023    Lab Results  Component Value Date   PSA Dr. Gorge Laud follows 04/24/2008   PSA normal (0.58) 10/28/2006    No results found for: "TESTOSTERONE"  No results found for: "HGBA1C"  Urinalysis    Component Value Date/Time   COLORURINE YELLOW 04/13/2020 1120   APPEARANCEUR Clear 01/23/2023 1019   LABSPEC 1.024 04/13/2020 1120   PHURINE 6.0 04/13/2020 1120   GLUCOSEU Negative 01/23/2023 1019   HGBUR NEGATIVE 04/13/2020 1120   HGBUR negative 04/24/2008 0820   BILIRUBINUR Negative 01/23/2023 1019   KETONESUR negative 04/20/2020 1836  KETONESUR 20 (A) 04/13/2020 1120   PROTEINUR Negative 01/23/2023 1019   PROTEINUR 30 (A) 04/13/2020 1120   UROBILINOGEN 4.0 (A) 04/20/2020 1836   UROBILINOGEN 0.2 04/24/2008 0820   NITRITE Negative 01/23/2023 1019   NITRITE NEGATIVE 04/13/2020 1120   LEUKOCYTESUR Negative 01/23/2023 1019   LEUKOCYTESUR NEGATIVE 04/13/2020 1120    Lab Results  Component Value Date   LABMICR Comment 01/23/2023   BACTERIA NONE SEEN 04/13/2020    Pertinent Imaging:  No results found for this or any previous visit.  No results found for this or any previous visit.  No results found for this or any previous visit.  No results found for this or any previous visit.  No results found for this or any previous visit.  No results found for this or any previous visit.  No results found for this or any previous visit.  No results found for this or any previous visit.   Assessment & Plan:    1. Urinary retention (Primary) Continue flomax  0.4mg  BID. We discussed adding finasteride and the patient defers at this time - Urinalysis, Routine w reflex microscopic - BLADDER SCAN AMB NON-IMAGING  2. Benign prostatic hyperplasia with urinary obstruction -continue flomax  0.4mg  BID   No follow-ups on file.  Jeffrey Nailer, MD  [facility] [place]

## 2024-01-22 NOTE — Patient Instructions (Signed)

## 2024-01-22 NOTE — Addendum Note (Signed)
 Addended by: Libra Gatz L on: 01/22/2024 10:44 AM   Modules accepted: Orders

## 2024-01-22 NOTE — Addendum Note (Signed)
 Addended by: Lucion Dilger L on: 01/22/2024 11:29 AM   Modules accepted: Orders

## 2024-03-06 ENCOUNTER — Telehealth: Payer: Self-pay

## 2024-03-06 NOTE — Telephone Encounter (Signed)
 Up to date on meds, next review in September

## 2024-03-19 DIAGNOSIS — R7303 Prediabetes: Secondary | ICD-10-CM | POA: Diagnosis not present

## 2024-03-19 DIAGNOSIS — I10 Essential (primary) hypertension: Secondary | ICD-10-CM | POA: Diagnosis not present

## 2024-03-19 DIAGNOSIS — E039 Hypothyroidism, unspecified: Secondary | ICD-10-CM | POA: Diagnosis not present

## 2024-03-26 DIAGNOSIS — E039 Hypothyroidism, unspecified: Secondary | ICD-10-CM | POA: Diagnosis not present

## 2024-03-26 DIAGNOSIS — I10 Essential (primary) hypertension: Secondary | ICD-10-CM | POA: Diagnosis not present

## 2024-03-26 DIAGNOSIS — K219 Gastro-esophageal reflux disease without esophagitis: Secondary | ICD-10-CM | POA: Diagnosis not present

## 2024-03-26 DIAGNOSIS — E871 Hypo-osmolality and hyponatremia: Secondary | ICD-10-CM | POA: Diagnosis not present

## 2024-03-26 DIAGNOSIS — J4540 Moderate persistent asthma, uncomplicated: Secondary | ICD-10-CM | POA: Diagnosis not present

## 2024-03-26 DIAGNOSIS — K7689 Other specified diseases of liver: Secondary | ICD-10-CM | POA: Diagnosis not present

## 2024-03-26 DIAGNOSIS — H409 Unspecified glaucoma: Secondary | ICD-10-CM | POA: Diagnosis not present

## 2024-03-26 DIAGNOSIS — E782 Mixed hyperlipidemia: Secondary | ICD-10-CM | POA: Diagnosis not present

## 2025-01-20 ENCOUNTER — Other Ambulatory Visit

## 2025-01-29 ENCOUNTER — Ambulatory Visit: Admitting: Urology
